# Patient Record
Sex: Male | Born: 1950 | Race: Black or African American | Hispanic: No | Marital: Married | Smoking: Never smoker
Health system: Southern US, Community
[De-identification: ages and names within clinical notes are randomized; demographics above are authoritative.]

## PROBLEM LIST (undated history)

## (undated) DIAGNOSIS — I10 Essential (primary) hypertension: Secondary | ICD-10-CM

## (undated) DIAGNOSIS — G4730 Sleep apnea, unspecified: Secondary | ICD-10-CM

## (undated) DIAGNOSIS — R569 Unspecified convulsions: Secondary | ICD-10-CM

## (undated) DIAGNOSIS — N529 Male erectile dysfunction, unspecified: Secondary | ICD-10-CM

## (undated) DIAGNOSIS — E785 Hyperlipidemia, unspecified: Secondary | ICD-10-CM

## (undated) HISTORY — PX: POLYPECTOMY: SHX149

## (undated) HISTORY — PX: SEPTOPLASTY: SUR1290

## (undated) HISTORY — DX: Essential (primary) hypertension: I10

## (undated) HISTORY — DX: Unspecified convulsions: R56.9

## (undated) HISTORY — PX: KNEE SURGERY: SHX244

## (undated) HISTORY — DX: Hyperlipidemia, unspecified: E78.5

## (undated) HISTORY — DX: Sleep apnea, unspecified: G47.30

## (undated) HISTORY — PX: COLONOSCOPY: SHX174

## (undated) HISTORY — DX: Male erectile dysfunction, unspecified: N52.9

---

## 1995-07-23 ENCOUNTER — Encounter: Payer: Self-pay | Admitting: Family Medicine

## 1995-07-23 LAB — CONVERTED CEMR LAB: PSA: 0.9 ng/mL

## 1998-06-13 ENCOUNTER — Emergency Department (HOSPITAL_COMMUNITY): Admission: EM | Admit: 1998-06-13 | Discharge: 1998-06-13 | Payer: Self-pay | Admitting: Emergency Medicine

## 2004-08-15 ENCOUNTER — Ambulatory Visit: Payer: Self-pay | Admitting: Family Medicine

## 2004-09-19 ENCOUNTER — Encounter: Payer: Self-pay | Admitting: Family Medicine

## 2004-09-19 LAB — CONVERTED CEMR LAB: PSA: 0.68 ng/mL

## 2004-09-21 ENCOUNTER — Ambulatory Visit: Payer: Self-pay | Admitting: Family Medicine

## 2004-09-26 ENCOUNTER — Ambulatory Visit: Payer: Self-pay | Admitting: Family Medicine

## 2004-12-25 ENCOUNTER — Ambulatory Visit: Payer: Self-pay | Admitting: Family Medicine

## 2005-07-22 ENCOUNTER — Encounter: Payer: Self-pay | Admitting: Family Medicine

## 2005-07-30 ENCOUNTER — Ambulatory Visit: Payer: Self-pay | Admitting: Family Medicine

## 2005-08-01 ENCOUNTER — Ambulatory Visit: Payer: Self-pay | Admitting: Family Medicine

## 2005-08-15 ENCOUNTER — Ambulatory Visit: Payer: Self-pay | Admitting: Family Medicine

## 2005-09-02 ENCOUNTER — Ambulatory Visit: Payer: Self-pay | Admitting: Family Medicine

## 2005-10-02 ENCOUNTER — Ambulatory Visit: Payer: Self-pay | Admitting: Family Medicine

## 2005-12-02 ENCOUNTER — Ambulatory Visit: Payer: Self-pay | Admitting: Family Medicine

## 2006-03-06 ENCOUNTER — Ambulatory Visit: Payer: Self-pay | Admitting: Family Medicine

## 2006-07-22 ENCOUNTER — Encounter: Payer: Self-pay | Admitting: Family Medicine

## 2006-07-22 LAB — CONVERTED CEMR LAB: PSA: 0.81 ng/mL

## 2006-08-05 ENCOUNTER — Ambulatory Visit: Payer: Self-pay | Admitting: Family Medicine

## 2006-08-05 LAB — CONVERTED CEMR LAB
ALT: 24 units/L (ref 0–40)
AST: 33 units/L (ref 0–37)
Albumin: 4 g/dL (ref 3.5–5.2)
Alkaline Phosphatase: 71 units/L (ref 39–117)
BUN: 11 mg/dL (ref 6–23)
CO2: 33 meq/L — ABNORMAL HIGH (ref 19–32)
Calcium: 9.6 mg/dL (ref 8.4–10.5)
Chloride: 100 meq/L (ref 96–112)
Cholesterol: 182 mg/dL (ref 0–200)
Creatinine, Ser: 1.1 mg/dL (ref 0.4–1.5)
GFR calc Af Amer: 89 mL/min
GFR calc non Af Amer: 74 mL/min
Glucose, Bld: 98 mg/dL (ref 70–99)
HDL: 58.8 mg/dL (ref >39.0)
LDL Cholesterol: 111 mg/dL — ABNORMAL HIGH (ref 0–99)
PSA: 0.81 ng/mL (ref 0.10–4.00)
Potassium: 4.4 meq/L (ref 3.5–5.1)
Sodium: 138 meq/L (ref 135–145)
TSH: 1.52 microintl units/mL (ref 0.35–5.50)
Total Bilirubin: 1 mg/dL (ref 0.3–1.2)
Total CHOL/HDL Ratio: 3.1
Total Protein: 7.4 g/dL (ref 6.0–8.3)
Triglycerides: 60 mg/dL (ref 0–149)
VLDL: 12 mg/dL (ref 0–40)

## 2006-08-26 ENCOUNTER — Ambulatory Visit: Payer: Self-pay | Admitting: Family Medicine

## 2007-01-29 ENCOUNTER — Encounter: Payer: Self-pay | Admitting: Family Medicine

## 2007-01-29 DIAGNOSIS — I10 Essential (primary) hypertension: Secondary | ICD-10-CM

## 2007-01-29 DIAGNOSIS — N4889 Other specified disorders of penis: Secondary | ICD-10-CM | POA: Insufficient documentation

## 2007-01-29 DIAGNOSIS — E785 Hyperlipidemia, unspecified: Secondary | ICD-10-CM | POA: Insufficient documentation

## 2007-09-24 ENCOUNTER — Ambulatory Visit: Payer: Self-pay | Admitting: Family Medicine

## 2007-10-20 ENCOUNTER — Ambulatory Visit: Payer: Self-pay | Admitting: Family Medicine

## 2007-10-20 LAB — CONVERTED CEMR LAB
OCCULT 1: POSITIVE
OCCULT 2: NEGATIVE
OCCULT 3: NEGATIVE

## 2007-11-03 ENCOUNTER — Ambulatory Visit: Payer: Self-pay | Admitting: Family Medicine

## 2007-11-03 LAB — CONVERTED CEMR LAB
ALT: 19 units/L (ref 0–53)
AST: 26 units/L (ref 0–37)
Albumin: 3.8 g/dL (ref 3.5–5.2)
Alkaline Phosphatase: 67 units/L (ref 39–117)
BUN: 15 mg/dL (ref 6–23)
Bilirubin, Direct: 0.1 mg/dL (ref 0.0–0.3)
CO2: 32 meq/L (ref 19–32)
Calcium: 9.3 mg/dL (ref 8.4–10.5)
Chloride: 105 meq/L (ref 96–112)
Cholesterol: 190 mg/dL (ref 0–200)
Creatinine, Ser: 1.1 mg/dL (ref 0.4–1.5)
GFR calc Af Amer: 88 mL/min
GFR calc non Af Amer: 73 mL/min
Glucose, Bld: 103 mg/dL — ABNORMAL HIGH (ref 70–99)
HDL: 51.8 mg/dL (ref >39.0)
LDL Cholesterol: 127 mg/dL — ABNORMAL HIGH (ref 0–99)
PSA: 0.59 ng/mL (ref 0.10–4.00)
Potassium: 4.5 meq/L (ref 3.5–5.1)
Sodium: 141 meq/L (ref 135–145)
TSH: 2.15 microintl units/mL (ref 0.35–5.50)
Total Bilirubin: 0.9 mg/dL (ref 0.3–1.2)
Total CHOL/HDL Ratio: 3.7
Total Protein: 7.1 g/dL (ref 6.0–8.3)
Triglycerides: 58 mg/dL (ref 0–149)
VLDL: 12 mg/dL (ref 0–40)

## 2007-11-10 ENCOUNTER — Ambulatory Visit: Payer: Self-pay | Admitting: Family Medicine

## 2008-02-11 ENCOUNTER — Ambulatory Visit: Payer: Self-pay | Admitting: Family Medicine

## 2008-02-11 DIAGNOSIS — F528 Other sexual dysfunction not due to a substance or known physiological condition: Secondary | ICD-10-CM | POA: Insufficient documentation

## 2008-02-29 ENCOUNTER — Telehealth: Payer: Self-pay | Admitting: Family Medicine

## 2008-03-10 ENCOUNTER — Ambulatory Visit: Payer: Self-pay | Admitting: Family Medicine

## 2008-03-10 LAB — CONVERTED CEMR LAB
OCCULT 1: NEGATIVE
OCCULT 2: NEGATIVE
OCCULT 3: NEGATIVE

## 2008-03-11 ENCOUNTER — Encounter (INDEPENDENT_AMBULATORY_CARE_PROVIDER_SITE_OTHER): Payer: Self-pay | Admitting: *Deleted

## 2008-05-16 ENCOUNTER — Ambulatory Visit: Payer: Self-pay | Admitting: Family Medicine

## 2008-12-15 ENCOUNTER — Encounter (INDEPENDENT_AMBULATORY_CARE_PROVIDER_SITE_OTHER): Payer: Self-pay | Admitting: *Deleted

## 2008-12-21 ENCOUNTER — Ambulatory Visit: Payer: Self-pay | Admitting: Family Medicine

## 2008-12-22 LAB — CONVERTED CEMR LAB
ALT: 25 units/L (ref 0–53)
AST: 30 units/L (ref 0–37)
Albumin: 3.7 g/dL (ref 3.5–5.2)
Alkaline Phosphatase: 63 units/L (ref 39–117)
BUN: 17 mg/dL (ref 6–23)
Basophils Absolute: 0 10*3/uL (ref 0.0–0.1)
Basophils Relative: 1.2 % (ref 0.0–3.0)
Bilirubin, Direct: 0 mg/dL (ref 0.0–0.3)
CO2: 29 meq/L (ref 19–32)
Calcium: 9 mg/dL (ref 8.4–10.5)
Chloride: 109 meq/L (ref 96–112)
Cholesterol: 193 mg/dL (ref 0–200)
Creatinine, Ser: 1.2 mg/dL (ref 0.4–1.5)
Eosinophils Absolute: 0.1 10*3/uL (ref 0.0–0.7)
Eosinophils Relative: 2.6 % (ref 0.0–5.0)
GFR calc non Af Amer: 79.61 mL/min (ref >60)
Glucose, Bld: 93 mg/dL (ref 70–99)
HCT: 38.8 % — ABNORMAL LOW (ref 39.0–52.0)
HDL: 54.5 mg/dL (ref >39.00)
Hemoglobin: 13.3 g/dL (ref 13.0–17.0)
LDL Cholesterol: 129 mg/dL — ABNORMAL HIGH (ref 0–99)
Lymphocytes Relative: 40.9 % (ref 12.0–46.0)
Lymphs Abs: 0.9 10*3/uL (ref 0.7–4.0)
MCHC: 34.4 g/dL (ref 30.0–36.0)
MCV: 88.8 fL (ref 78.0–100.0)
Monocytes Absolute: 0.4 10*3/uL (ref 0.1–1.0)
Monocytes Relative: 18.2 % — ABNORMAL HIGH (ref 3.0–12.0)
Neutro Abs: 0.9 10*3/uL — ABNORMAL LOW (ref 1.4–7.7)
Neutrophils Relative %: 37.1 % — ABNORMAL LOW (ref 43.0–77.0)
PSA: 0.96 ng/mL (ref 0.10–4.00)
Platelets: 181 10*3/uL (ref 150.0–400.0)
Potassium: 4.1 meq/L (ref 3.5–5.1)
RBC: 4.36 M/uL (ref 4.22–5.81)
RDW: 12.3 % (ref 11.5–14.6)
Sodium: 142 meq/L (ref 135–145)
TSH: 1.4 microintl units/mL (ref 0.35–5.50)
Total Bilirubin: 1.1 mg/dL (ref 0.3–1.2)
Total CHOL/HDL Ratio: 4
Total Protein: 7.1 g/dL (ref 6.0–8.3)
Triglycerides: 49 mg/dL (ref 0.0–149.0)
VLDL: 9.8 mg/dL (ref 0.0–40.0)
WBC: 2.3 10*3/uL — ABNORMAL LOW (ref 4.5–10.5)

## 2009-01-04 ENCOUNTER — Ambulatory Visit: Payer: Self-pay | Admitting: Family Medicine

## 2009-01-04 DIAGNOSIS — K921 Melena: Secondary | ICD-10-CM

## 2009-01-04 LAB — FECAL OCCULT BLOOD, GUAIAC: Fecal Occult Blood: POSITIVE

## 2009-01-04 LAB — CONVERTED CEMR LAB
OCCULT 1: POSITIVE
OCCULT 2: NEGATIVE
OCCULT 3: NEGATIVE

## 2009-02-02 ENCOUNTER — Ambulatory Visit: Payer: Self-pay | Admitting: Gastroenterology

## 2009-02-17 ENCOUNTER — Ambulatory Visit: Payer: Self-pay | Admitting: Gastroenterology

## 2009-02-17 ENCOUNTER — Encounter: Payer: Self-pay | Admitting: Gastroenterology

## 2009-02-17 LAB — HM COLONOSCOPY: HM Colonoscopy: 2

## 2009-02-26 ENCOUNTER — Encounter: Payer: Self-pay | Admitting: Gastroenterology

## 2009-03-23 ENCOUNTER — Ambulatory Visit: Payer: Self-pay | Admitting: Family Medicine

## 2009-12-25 ENCOUNTER — Ambulatory Visit: Payer: Self-pay | Admitting: Family Medicine

## 2009-12-25 LAB — CONVERTED CEMR LAB
AST: 27 units/L (ref 0–37)
Albumin: 4.1 g/dL (ref 3.5–5.2)
BUN: 16 mg/dL (ref 6–23)
Basophils Absolute: 0 10*3/uL (ref 0.0–0.1)
Basophils Relative: 0.9 % (ref 0.0–3.0)
Bilirubin, Direct: 0.1 mg/dL (ref 0.0–0.3)
CO2: 31 meq/L (ref 19–32)
Calcium: 9.3 mg/dL (ref 8.4–10.5)
Chloride: 105 meq/L (ref 96–112)
Cholesterol: 205 mg/dL — ABNORMAL HIGH (ref 0–200)
Creatinine, Ser: 1.2 mg/dL (ref 0.4–1.5)
Direct LDL: 123.8 mg/dL
Eosinophils Absolute: 0.1 10*3/uL (ref 0.0–0.7)
Glucose, Bld: 91 mg/dL (ref 70–99)
HCT: 39.3 % (ref 39.0–52.0)
Hemoglobin: 13.3 g/dL (ref 13.0–17.0)
Lymphocytes Relative: 37.7 % (ref 12.0–46.0)
Lymphs Abs: 1 10*3/uL (ref 0.7–4.0)
MCHC: 33.9 g/dL (ref 30.0–36.0)
MCV: 89.2 fL (ref 78.0–100.0)
Monocytes Absolute: 0.5 10*3/uL (ref 0.1–1.0)
Neutro Abs: 1.1 10*3/uL — ABNORMAL LOW (ref 1.4–7.7)
Neutrophils Relative %: 40.6 % — ABNORMAL LOW (ref 43.0–77.0)
PSA: 0.85 ng/mL (ref 0.10–4.00)
Platelets: 196 10*3/uL (ref 150.0–400.0)
Potassium: 4.4 meq/L (ref 3.5–5.1)
RBC: 4.41 M/uL (ref 4.22–5.81)
Sodium: 141 meq/L (ref 135–145)
TSH: 1.3 microintl units/mL (ref 0.35–5.50)
Total Bilirubin: 0.8 mg/dL (ref 0.3–1.2)
Total CHOL/HDL Ratio: 3
Total Protein: 7.1 g/dL (ref 6.0–8.3)
Triglycerides: 52 mg/dL (ref 0.0–149.0)
VLDL: 10.4 mg/dL (ref 0.0–40.0)
WBC: 2.7 10*3/uL — ABNORMAL LOW (ref 4.5–10.5)

## 2009-12-27 ENCOUNTER — Ambulatory Visit: Payer: Self-pay | Admitting: Family Medicine

## 2009-12-27 DIAGNOSIS — D72819 Decreased white blood cell count, unspecified: Secondary | ICD-10-CM | POA: Insufficient documentation

## 2010-02-27 ENCOUNTER — Encounter (INDEPENDENT_AMBULATORY_CARE_PROVIDER_SITE_OTHER): Payer: Self-pay | Admitting: *Deleted

## 2010-05-21 ENCOUNTER — Encounter: Admission: RE | Admit: 2010-05-21 | Discharge: 2010-05-21 | Payer: Self-pay | Admitting: Occupational Medicine

## 2010-08-21 NOTE — Letter (Signed)
Summary: Juan Wagner letter  [place] at [facility]  [address] [place], [place] [address]   Phone: [phone]  Fax: [phone]       02/27/2010 MRN: [id number]  Juan Wagner [address] [place], [place]  [address]  Dear Mr. HERMIZ,  [facility], and [facility] announce the retirement of Arta Silence, M.D., from full-time practice at the [facility] office effective January 18, 2010 and his plans of returning part-time.  It is important to Dr. Hetty Ely and to our practice that you understand that [facility] has seven physicians in our office for your health care needs.  We will continue to offer the same exceptional care that you have today.    Dr. Hetty Ely has spoken to many of you about his plans for retirement and returning part-time in the fall.   We will continue to work with you through the transition to schedule appointments for you in the office and meet the high standards that [place] is committed to.   Again, it is with great pleasure that we share the news that Dr. Hetty Ely will return to [facility] at [facility] in October of 2011 with a reduced schedule.    If you have any questions, or would like to request an appointment with one of our physicians, please call us at [phone] and press the option for Scheduling an appointment.  We take pleasure in providing you with excellent patient care and look forward to seeing you at your next office visit.  Our [facility] Physicians are:  Tillman Abide, M.D. Laurita Quint, M.D. Roxy Manns, M.D. Kerby Nora, M.D. Hannah Beat, M.D. Ruthe Mannan, M.D. We proudly welcomed Raechel Ache, M.D. and Eustaquio Boyden, M.D. to the practice in July/August 2011.  Sincerely,  [facility]

## 2010-08-21 NOTE — Assessment & Plan Note (Signed)
Summary: CPX/CLE   Vital Signs:  Patient profile:   60 year old male Weight:      191.50 pounds BMI:     27.58 Temp:     98.6 degrees F oral Pulse rate:   60 / minute Pulse rhythm:   regular BP sitting:   140 / 100  (left arm) Cuff size:   large  Vitals Entered By: Sydell Axon LPN (December 28, 2839 9:15 AM) CC: 30 Minute checkup, had a colonoscopy 07/10 by Dr. Russella Dar   History of Present Illness: Pt here for Comp Exam...he feels well and has no complaints. His pressure is slightly elevated today.  Problems Prior to Update: 1)  Hemoccult Positive Stool  (ICD-578.1) 2)  Erectile Dysfunction  (ICD-302.72) 3)  Special Screening Malignant Neoplasm of Prostate  (ICD-V76.44) 4)  Special Screening Malig Neoplasms Other Sites  (ICD-V76.49) 5)  Health Maintenance Exam  (ICD-V70.0) 6)  Peyronie's Disease  (ICD-607.89) 7)  Hypertension  (ICD-401.9) 8)  Hyperlipidemia  (ICD-272.4)  Medications Prior to Update: 1)  Multivitamins   Tabs (Multiple Vitamin) .... Once Daily By Mouth 2)  Bl Vitamin E 400 Unit  Caps (Vitamin E) .... Once Daily By Mouth 3)  Carvedilol 6.25 Mg  Tabs (Carvedilol) .... One Tab By Mouth Two Times A Day. 4)  Viagra 100 Mg Tabs (Sildenafil Citrate) .... One Tab By Mouth One Hour Prior To Desired Intercourse. 5)  Amlodipine Besylate 5 Mg Tabs (Amlodipine Besylate) .... One Tab By Mouth At Night  Allergies: 1)  Lisinopril (Lisinopril)  Past History:  Past Medical History: Last updated: 01/29/2007 Hyperlipidemia Hypertension  Past Surgical History: Last updated: 03/01/2009 Septoplasty  Broken Nose Colonoscopy 2 Polyps 02/17/09       8/15  Family History: Last updated: 12-28-09 Father: Died 50  CA, oral gum, smoker, ETOH Mother: Alive 59  HTN   Ovarian Ca, chemo 2010 remission Brother A 63 Cirrhosis (Smoke and drugs) Brother A 58 Sister dec 55  Pancr Ca Stage IV, mets to lungs HBP:  (+) self, MGM, MGF, Mother CA:  Father ETOH:  (+) Father Stroke:   (-)  Social History: Last updated: 12/21/2008 Never Smoked Alcohol use-no Drug use-no Marital Status: Married Children: 2 Occupation: IT sales professional [employer]  Risk Factors: Alcohol Use: 0 (12/21/2008) Caffeine Use: 0 (12/21/2008) Exercise: yes (12/21/2008)  Risk Factors: Smoking Status: never (12/21/2008) Passive Smoke Exposure: no (12/21/2008)  Family History: Father: Died 80  CA, oral gum, smoker, ETOH Mother: Alive 76  HTN   Ovarian Ca, chemo 2010 remission Brother A 63 Cirrhosis (Smoke and drugs) Brother A 58 Sister dec 55  Pancr Ca Stage IV, mets to lungs HBP:  (+) self, MGM, MGF, Mother CA:  Father ETOH:  (+) Father Stroke:  (-)  Review of Systems General:  Denies chills, fatigue, fever, sweats, weakness, and weight loss. Eyes:  Denies blurring, discharge, and eye pain; stye removal last week, left eye.. ENT:  Denies decreased hearing, earache, and ringing in ears. CV:  Denies chest pain or discomfort, fainting, fatigue, palpitations, shortness of breath with exertion, swelling of feet, and swelling of hands. Resp:  Denies cough, shortness of breath, and wheezing. GI:  Denies abdominal pain, bloody stools, change in bowel habits, constipation, dark tarry stools, diarrhea, indigestion, loss of appetite, nausea, vomiting, vomiting blood, and yellowish skin color. GU:  Denies dysuria, nocturia, and urinary frequency. MS:  Denies joint pain, low back pain, muscle aches, cramps, and stiffness. Derm:  Denies dryness, itching, and rash. Neuro:  Denies numbness, poor balance, tingling, and tremors.  Physical Exam  General:  Well-developed,well-nourished,in no acute distress; alert,appropriate and cooperative throughout examination Head:  Normocephalic and atraumatic without obvious abnormalities. No apparent alopecia or balding. Eyes:  Conjunctiva clear bilaterally.  Ears:  External ear exam shows no significant lesions or deformities.  Otoscopic examination reveals clear  canals, tympanic membranes are intact bilaterally without bulging, retraction, inflammation or discharge. Hearing is grossly normal bilaterally. Nose:  External nasal examination shows no deformity or inflammation. Nasal mucosa are pink and moist without lesions or exudates. Mouth:  Oral mucosa and oropharynx without lesions or exudates.  Teeth in good repair. Neck:  No deformities, masses, or tenderness noted. Chest Wall:  No deformities, masses, tenderness or gynecomastia noted. Breasts:  No masses or gynecomastia noted Lungs:  Normal respiratory effort, chest expands symmetrically. Lungs are clear to auscultation, no crackles or wheezes. Heart:  Normal rate and regular rhythm. S1 and S2 normal without gallop, murmur, click, rub or other extra sounds. Abdomen:  Bowel sounds positive,abdomen soft and non-tender without masses, organomegaly or hernias noted. Rectal:  No external abnormalities noted. Normal sphincter tone. No rectal masses or tenderness. G neg. Genitalia:  Testes bilaterally descended without nodularity, tenderness or masses. No scrotal masses or lesions. No penis lesions or urethral discharge. Prostate:  Prostate gland firm and smooth, no enlargement, nodularity, tenderness, mass, asymmetry or induration. 10-20gms. Msk:  No deformity or scoliosis noted of thoracic or lumbar spine.   Pulses:  R and L carotid,radial,femoral,dorsalis pedis and posterior tibial pulses are full and equal bilaterally Extremities:  No clubbing, cyanosis, edema, or deformity noted with normal full range of motion of all joints.   Neurologic:  No cranial nerve deficits noted. Station and gait are normal. Plantar reflexes are down-going bilaterally. DTRs are symmetrical throughout. Sensory, motor and coordinative functions appear intact. Skin:  Intact without suspicious lesions or rashes Cervical Nodes:  No lymphadenopathy noted Inguinal Nodes:  No significant adenopathy Psych:  Cognition and judgment  appear intact. Alert and cooperative with normal attention span and concentration. No apparent delusions, illusions, hallucinations   Impression & Recommendations:  Problem # 1:  HEALTH MAINTENANCE EXAM (ICD-V70.0) Assessment Comment Only  Problem # 2:  SPECIAL SCREENING MALIGNANT NEOPLASM OF PROSTATE (ICD-V76.44) Assessment: Unchanged Stable PSA and exam.  Problem # 3:  HYPERTENSION (ICD-401.9) Assessment: Deteriorated Will try to check by having him get BPs at work weekly and review. If typically high, increase Amlodipine to 10mg  daily. If not, cont curr dose. See me in OCt or Drs Para March or gutierrez in 3 mos. The following medications were removed from the medication list:    Carvedilol 6.25 Mg Tabs (Carvedilol) ..... One tab by mouth two times a day. His updated medication list for this problem includes:    Amlodipine Besylate 5 Mg Tabs (Amlodipine besylate) ..... One tab by mouth at night  BP today: 140/100 Prior BP: 120/80 (03/23/2009)  Labs Reviewed: K+: 4.4 (12/25/2009) Creat: : 1.2 (12/25/2009)   Chol: 205 (12/25/2009)   HDL: 63.90 (12/25/2009)   LDL: 129 (12/21/2008)   TG: 52.0 (12/25/2009)  Problem # 4:  HYPERLIPIDEMIA (ICD-272.4) Assessment: Unchanged Stable, LDL slightly elevated but adequate for him. Avoid fatty foods. 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 May need referral  if continues. Recheck in the future.  Complete Medication List: 1)  Multivitamins Tabs (Multiple vitamin) .... Once daily by mouth 2)  Viagra 100 Mg Tabs (Sildenafil citrate) .... One tab by mouth one hour prior to desired intercourse. 3)  Amlodipine Besylate 5 Mg Tabs (Amlodipine besylate) .... One tab by  mouth at night  Patient Instructions: 1)  Call in three weeks for appt w/me in Oct or if not avail, with new doc in three mos. Prescriptions: AMLODIPINE BESYLATE 5 MG TABS (AMLODIPINE BESYLATE) one tab by mouth at night  #30 x 12   Entered and Authorized by:   Shaune Leeks MD   Signed by:   Shaune Leeks MD on 12/27/2009   Method used:   Electronically to        [facility]  [address] (retail)       [address], [address]       [place], [place]  [address]       Ph: [phone]       Fax: [phone]   RxID:   [id number]   Current Allergies (reviewed today): LISINOPRIL (LISINOPRIL)

## 2011-02-20 DIAGNOSIS — G40219 Localization-related (focal) (partial) symptomatic epilepsy and epileptic syndromes with complex partial seizures, intractable, without status epilepticus: Secondary | ICD-10-CM | POA: Insufficient documentation

## 2011-03-30 ENCOUNTER — Emergency Department (HOSPITAL_COMMUNITY)
Admission: EM | Admit: 2011-03-30 | Discharge: 2011-03-30 | Disposition: A | Discharge status: Home / Self Care | Payer: 59 | Attending: Emergency Medicine | Admitting: Emergency Medicine

## 2011-03-30 ENCOUNTER — Emergency Department (HOSPITAL_COMMUNITY): Payer: 59

## 2011-03-30 DIAGNOSIS — S01502A Unspecified open wound of oral cavity, initial encounter: Secondary | ICD-10-CM | POA: Insufficient documentation

## 2011-03-30 DIAGNOSIS — I10 Essential (primary) hypertension: Secondary | ICD-10-CM | POA: Insufficient documentation

## 2011-03-30 DIAGNOSIS — R569 Unspecified convulsions: Secondary | ICD-10-CM | POA: Insufficient documentation

## 2011-03-30 DIAGNOSIS — Z79899 Other long term (current) drug therapy: Secondary | ICD-10-CM | POA: Insufficient documentation

## 2011-03-30 DIAGNOSIS — R279 Unspecified lack of coordination: Secondary | ICD-10-CM | POA: Insufficient documentation

## 2011-03-30 DIAGNOSIS — W503XXA Accidental bite by another person, initial encounter: Secondary | ICD-10-CM | POA: Insufficient documentation

## 2011-03-30 LAB — CBC
HCT: 38.3 % — ABNORMAL LOW (ref 39.0–52.0)
MCH: 29.3 pg (ref 26.0–34.0)
MCHC: 34.2 g/dL (ref 30.0–36.0)
MCV: 85.7 fL (ref 78.0–100.0)
Platelets: 184 10*3/uL (ref 150–400)
RDW: 11.9 % (ref 11.5–15.5)
WBC: 3.8 10*3/uL — ABNORMAL LOW (ref 4.0–10.5)

## 2011-03-30 LAB — DIFFERENTIAL
Basophils Absolute: 0 10*3/uL (ref 0.0–0.1)
Basophils Relative: 1 % (ref 0–1)
Eosinophils Absolute: 0.1 10*3/uL (ref 0.0–0.7)
Eosinophils Relative: 2 % (ref 0–5)
Lymphocytes Relative: 28 % (ref 12–46)
Lymphs Abs: 1.1 10*3/uL (ref 0.7–4.0)
Monocytes Absolute: 0.5 10*3/uL (ref 0.1–1.0)
Monocytes Relative: 13 % — ABNORMAL HIGH (ref 3–12)
Neutrophils Relative %: 57 % (ref 43–77)

## 2011-03-30 LAB — BASIC METABOLIC PANEL
BUN: 16 mg/dL (ref 6–23)
CO2: 27 mEq/L (ref 19–32)
Calcium: 9.2 mg/dL (ref 8.4–10.5)
Chloride: 101 mEq/L (ref 96–112)
Creatinine, Ser: 1.1 mg/dL (ref 0.50–1.35)
GFR calc Af Amer: >60 mL/min (ref >60)
GFR calc non Af Amer: >60 mL/min (ref >60)
Glucose, Bld: 115 mg/dL — ABNORMAL HIGH (ref 70–99)
Potassium: 3.8 mEq/L (ref 3.5–5.1)
Sodium: 137 mEq/L (ref 135–145)

## 2011-04-01 ENCOUNTER — Encounter: Payer: Self-pay | Admitting: Family Medicine

## 2011-04-01 ENCOUNTER — Ambulatory Visit (INDEPENDENT_AMBULATORY_CARE_PROVIDER_SITE_OTHER): Payer: 59 | Admitting: Family Medicine

## 2011-04-01 VITALS — BP 136/80 | HR 60 | Temp 98.5°F | Wt 184.0 lb

## 2011-04-01 DIAGNOSIS — R569 Unspecified convulsions: Secondary | ICD-10-CM

## 2011-04-01 NOTE — Progress Notes (Signed)
Pt woke his wife at night.  He doesn't remember it.  He was kicking and jerking, bit his tongue.  He wasn't responsive to voice.  He stopped moving and then had postictal period. Witnessed by wife. Movement portion lasted a few minutes.  The ictal state lasted longer.  By the time EMS was there, he was able to mumble. He remembers going to bed before and the some of the hospital after the event, but none of the interval history.    No h/o seizure prev.  Wife has noted some difficulty with short term recall after the ER.  He was drowsy after treatment at the ER, unclear how much of this is related to the memory changes.  No known h/o TBI, concussions.  Wife has noted snoring, she may have noted apneas that were brief.  She's noted other movements in the bed, "he'll kick me."    He's been under a lot of stress, concerned about people at work.  He needs neuro referral arranged.   PMH, FH, and SH reviewed, no FH SZ d/o.   ROS: See HPI, otherwise noncontributory.  Meds, vitals, and allergies reviewed.   GEN: nad, alert HEENT: mucous membranes moist NECK: supple w/o LA CV: rrr.  no murmur PULM: ctab, no inc wob ABD: soft, +bs EXT: no edema SKIN: no acute rash CN 2-12 wnl B, S/S/DTR wnl x4 (except global and symmetric dec in DTRs), no tremor, no pronator drift, normal finger to nose, cerebellar testing wnl Orient to place, person, year, month and day of week.  He thought today was Sept 11th, not the 10th. 0/3 of recall 2/5 of DLROW (missed order of last 3) Speech wnl Follows commands

## 2011-04-01 NOTE — Assessment & Plan Note (Signed)
ER recs reviewed.  New dx, potentially complicated by a sleep disorder.  I talked with pt and wife.  I would like neuro input for SZ d/o and also for need for sleep study.  I don't know how much contribution potential OSA could play into this.  I didn't start new meds today.  If another SZ, then proceed to ER.  He and wife agree.  D/w pt about not getting into high risk situation (driving, etc).  He agrees.  >25 min spent with face to face with patient, >50% counseling.

## 2011-04-01 NOTE — Patient Instructions (Signed)
See Shirlee Limerick about your referral before your leave today. Don't drive, bike, or exercise on a treadmill.   I want you to talk with neurology about the recent events.  If you have another seizure, then go the ER.  Take care.

## 2011-04-02 ENCOUNTER — Encounter: Payer: Self-pay | Admitting: Neurology

## 2011-04-02 ENCOUNTER — Ambulatory Visit (INDEPENDENT_AMBULATORY_CARE_PROVIDER_SITE_OTHER): Payer: 59 | Admitting: Neurology

## 2011-04-02 VITALS — BP 144/104 | HR 64 | Ht 70.0 in | Wt 183.0 lb

## 2011-04-02 DIAGNOSIS — R569 Unspecified convulsions: Secondary | ICD-10-CM

## 2011-04-02 NOTE — Patient Instructions (Signed)
You have been scheduled for a MRI at [facility] on Saturday, Sept. 15th at 3:00pm.  Please arrive by 2:45pm.  We will call you with your appointment for the EEG.  It will be done at [facility].

## 2011-04-02 NOTE — Progress Notes (Signed)
Dear Dr. Para March,  Thank you for having me see Juan Wagner in consultation today for his new onset seizure.  As you may recall Juan Wagner is a 60 year old man who has a history of asymptomatic leukopenia who had a nocturnal convulsion on March 30, 2011.  His wife was awoken by him convulsing with an accompany head version, but no unilateral jerking. Juan Wagner bit his tongue. It is unclear the direction of the head version.  Juan Wagner does not remember the event, and does not even remember going to the ED.  Juan Wagner was given Ativan there, had an unremarkable head CT and then sent home.  Basic lab work was unremarkable.    Juan Wagner did have an event earlier in the night where Juan Wagner sat up in bed, and seemed unresponsive.  Juan Wagner did say Juan Wagner was "jittery", but then fell back to sleep.  Juan Wagner denies any obvious precipitating events.  Juan Wagner still was slightly confused the next day, but now has returned to normal.    PMHx:  HTN, HLD.  No history of serious head trauma, meningitis or encephalitis, dev delay or birth trauma.  Juan Wagner has never had febrile seizures.  SocHx:  No tob, no EtOH.  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  FamHx:  No epilepsy.  ROS:  13 systems were reviewed and are significant for leukopenia.  However, Juan Wagner has been asymptomatic.  Other ROS negative.  Exam: Filed Vitals:   04/02/11 1521  BP: 144/104  Pulse: 64  Height: 5\' 10"  (1.778 m)  Weight: 183 lb (83.008 kg)   Gen:  Very well appearing man who looks younger than stated age.  Cardiovascular: The patient has a regular rate and rhythm and no carotid bruits.  Fundoscopy:  Disks are flat. Vessel caliber within normal limits.  Mental status:   The patient is oriented to person, place and time. Recent and remote memory are intact. Attention span and concentration are normal. Language including repetition, naming, following commands are intact. Fund of knowledge of current and historical events, as well as vocabulary are normal.  Cranial Nerves: Pupils are equally round and  reactive to light. Visual fields full to confrontation. Extraocular movements are intact without nystagmus. Facial sensation and muscles of mastication are intact. Muscles of facial expression are symmetric. Hearing intact to bilateral finger rub. Tongue protrusion, uvula, palate midline.  Shoulder shrug intact  Motor:  The patient has normal bulk and tone, no pronator drift and 5/5 strength bilaterally.  There are no adventitious movements.  Reflexes:  Are 2+ RUE,  1+ LUE, 1+ RLE, absent LLE, toes down, no hoffman's  Coordination:  Normal finger to nose.  No dysdiadokinesia.  Sensation is intact to temperature and vibration.  Gait and Station are normal.  Tandem gait is intact.  Romberg is negative  Impression/Recs:  New onset convulsion.  Exam unremarkable except for mild asymmetry of reflexes.  I am going to get an MRI of his brain and an EEG.  Based on those results we will have to decide about further treatment.  I have told him Juan Wagner cannot drive for 6 months.  We discussed at length the need for a lumbar puncture, but despite his leukopenia I am not convinced Juan Wagner has a infective process given his excellent state of health after the event.  If however, Juan Wagner develops fevers or chills or other complaints Juan Wagner will need an LP urgently.  The differential for seizure in this age category is either due to a small ischemic stroke or  mass lesion so the MRI imaging is going to be very important.  I will see the patient back after his imaging is complete.  Thank you for having [place] see this patient in consultation.  Feel free to contact me with any questions.  [name] [name], MD [facility], [place] [address] [place], [place] [address] Phone: [phone] Fax: [phone].

## 2011-04-04 ENCOUNTER — Ambulatory Visit (HOSPITAL_COMMUNITY)
Admission: RE | Admit: 2011-04-04 | Discharge: 2011-04-04 | Disposition: A | Discharge status: Home / Self Care | Payer: 59 | Source: Ambulatory Visit | Attending: Neurology | Admitting: Neurology

## 2011-04-04 DIAGNOSIS — G40909 Epilepsy, unspecified, not intractable, without status epilepticus: Secondary | ICD-10-CM

## 2011-04-04 DIAGNOSIS — R569 Unspecified convulsions: Secondary | ICD-10-CM | POA: Insufficient documentation

## 2011-04-04 DIAGNOSIS — Z1389 Encounter for screening for other disorder: Secondary | ICD-10-CM | POA: Insufficient documentation

## 2011-04-05 NOTE — Procedures (Signed)
EEG NUMBER:  [id number]  This routine EEG was requested in this 60 year old man with a history of one seizure.  The purpose of this EEG is to look for interictal epileptiform discharges.  He is on no anti-convulsant medication  The EEG was done with the patient awake and drowsy.  During periods of maximal wakefulness, he had a 9-10 cycle per second posterior dominant rhythm that attenuated with eye opening and was symmetric.  Background activities were composed of both alpha and beta activities that were symmetric.  Photic stimulation produced a symmetric driving response.  Hyperventilation did not markedly change the tracing.  The patient did become drowsy as evidenced by an attenuation of the alpha rhythm and muscle activity.  There is slowing of background activities with bursts of theta activity.  Stage 2 sleep was not reached.  CLINICAL INTERPRETATION:  This routine EEG done with the patient awake is normal.          ______________________________ Denton Meek, MD    IO:NGEX D:  04/04/2011 22:41:52  T:  04/05/2011 04:08:04  Job #:  [id number]

## 2011-04-06 ENCOUNTER — Ambulatory Visit (HOSPITAL_COMMUNITY)
Admission: RE | Admit: 2011-04-06 | Discharge: 2011-04-06 | Disposition: A | Discharge status: Home / Self Care | Payer: 59 | Source: Ambulatory Visit | Attending: Neurology | Admitting: Neurology

## 2011-04-06 DIAGNOSIS — R569 Unspecified convulsions: Secondary | ICD-10-CM | POA: Insufficient documentation

## 2011-04-06 DIAGNOSIS — G930 Cerebral cysts: Secondary | ICD-10-CM | POA: Insufficient documentation

## 2011-04-06 MED ORDER — GADOBENATE DIMEGLUMINE 529 MG/ML IV SOLN
17.0000 mL | Freq: Once | INTRAVENOUS | Status: AC | PRN
  Administered 2011-04-06: 17 mL via INTRAVENOUS

## 2011-04-09 ENCOUNTER — Ambulatory Visit (INDEPENDENT_AMBULATORY_CARE_PROVIDER_SITE_OTHER): Payer: 59 | Admitting: Neurology

## 2011-04-09 ENCOUNTER — Encounter: Payer: Self-pay | Admitting: Neurology

## 2011-04-09 VITALS — BP 128/86 | HR 64 | Wt 185.0 lb

## 2011-04-09 DIAGNOSIS — R569 Unspecified convulsions: Secondary | ICD-10-CM

## 2011-04-09 MED ORDER — OXCARBAZEPINE 150 MG PO TABS: 450.0000 mg | ORAL_TABLET | Freq: Two times a day (BID) | ORAL | Status: DC

## 2011-04-09 NOTE — Progress Notes (Signed)
Dear Dr. Para March,  I saw [name] back to review the results of his MRI brain and routine EEG after his first presentation of seizure.  Since he was last seen he has not had any further events.  His routine EEG was normal.  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  I reviewed the images and I think it is a benign lesion.  However, given the new onset seizures we need to follow it closely.  ROS:  13 systems were reviewed and were otherwise negative.  Exam: Filed Vitals:   04/09/11 1121  BP: 128/86  Pulse: 64  Weight: 185 lb (83.915 kg)   Gen:  Very well appearing man.  Cardiovascular: The patient has a regular rate and rhythm and no carotid bruits.  Fundoscopy:  Disks are flat. Vessel caliber within normal limits.  Mental status:   The patient is oriented to person, place and time. Recent and remote memory are intact. Attention span and concentration are normal. Language including repetition, naming, following commands are intact. Fund of knowledge of current and historical events, as well as vocabulary are normal.  Cranial Nerves: Pupils are equally round and reactive to light. Visual fields full to confrontation. Extraocular movements are intact without nystagmus. Facial sensation and muscles of mastication are intact. Muscles of facial expression are symmetric. Hearing intact to bilateral finger rub. Tongue protrusion, uvula, palate midline.  Shoulder shrug intact  Motor:  The patient has normal bulk and tone, no pronator drift and 5/5 strength bilaterally.  There are no adventitious movements.  Reflexes: Are 2+ RUE, 1+ LUE, 1+ RLE, absent LLE, toes down, no hoffman's   Coordination:  Normal finger to nose.  No dysdiadokinesia.  Sensation is intact to temperature and vibration.  Gait and Station are normal.  Tandem gait is intact.  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  Plan 1.  Parahippocampal lesion - I will get another MRI brain in 6 weeks to follow the lesion.  If it is stable we will get another in 3 months and go q3 months for at least 1 year.  If there are any changes, I will refer the patient to neuro-oncology at [facility]. 2.  Seizure - Given his lesion and its location I recommended starting Trileptal and increase to 450mg  bid.  He cannot drive for 6 months from September 8th.  We will see the patient back in 3 months.  [name] [name], MD [facility], [place]

## 2011-04-09 NOTE — Patient Instructions (Addendum)
Titration to Trileptal(oxcarbazepine) 450mg  twice a day using  150mg  tablets.   Morning Dose Evening Dose  Week 1 0 tablets  1 tablet (150mg )  Week 2 1 tablet (150mg ) 1 tablet (150mg )  Week 3 1 tablet (150mg ) 2 tablets (300mg )  Week 4 2 tablets (300mg ) 2 tablets (300mg )  Week 5 2 tablets (300mg ) 3 tablets (450mg )  Week 6 3 tablets (450mg ) 3 tablets (450mg )  After Week 6 continue at 3 tablets twice per day. Titration requires 147 tablets 150mg  tablets.  Your next MRI is scheduled for Tuesday, October 30th at 8:00am at [facility].  Go by your primary care office a few days before the MRI to have your labs drawn.

## 2011-04-10 ENCOUNTER — Telehealth: Payer: Self-pay | Admitting: *Deleted

## 2011-04-10 DIAGNOSIS — I10 Essential (primary) hypertension: Secondary | ICD-10-CM

## 2011-04-10 MED ORDER — AMLODIPINE BESYLATE 5 MG PO TABS: 5.0000 mg | ORAL_TABLET | Freq: Every day | ORAL | Status: DC

## 2011-04-10 NOTE — Telephone Encounter (Signed)
Pt states he is to have an MRI on 10/30 and states he needs labs prior to the procedure.  Please advise.

## 2011-04-12 ENCOUNTER — Encounter: Payer: Self-pay | Admitting: Family Medicine

## 2011-04-13 NOTE — Telephone Encounter (Signed)
Please verify with rady that all he needs is a Cr 1 week before the MRI.  I have ordered the Cr in the meantime.  Thanks.

## 2011-04-15 NOTE — Telephone Encounter (Addendum)
After researching the MRI ordered it was determined that Dr. Denton Meek ordered this.  Spoke to [employer] which is the nurse at Dr. Nash Dimmer office and was advised that patient needs to have a Bmet done prior to MRI per Dr. [name]. Tiffany has put this order in the system. Tiffany stated that the patient told Dr. [name] that it would be easier for him to come here to get the blood work done.  Spoke to patient's wife and was informed that patient will be calling back to get the lab appointment scheduled since the order is already in the system.

## 2011-04-15 NOTE — Telephone Encounter (Signed)
Noted. I cancelled the Cr that I ordered but left the BMET per Dr. [name].

## 2011-05-16 ENCOUNTER — Other Ambulatory Visit (INDEPENDENT_AMBULATORY_CARE_PROVIDER_SITE_OTHER): Payer: 59

## 2011-05-16 DIAGNOSIS — R569 Unspecified convulsions: Secondary | ICD-10-CM

## 2011-05-16 LAB — BASIC METABOLIC PANEL
BUN: 17 mg/dL (ref 6–23)
CO2: 29 mEq/L (ref 19–32)
Chloride: 102 mEq/L (ref 96–112)
Creatinine, Ser: 1.1 mg/dL (ref 0.4–1.5)
Glucose, Bld: 89 mg/dL (ref 70–99)
Potassium: 4.3 mEq/L (ref 3.5–5.1)
Sodium: 138 mEq/L (ref 135–145)

## 2011-05-16 NOTE — Progress Notes (Signed)
Addended by: Baldomero Lamy on: 05/16/2011 09:11 AM   Modules accepted: Orders

## 2011-05-21 ENCOUNTER — Ambulatory Visit (HOSPITAL_COMMUNITY)
Admission: RE | Admit: 2011-05-21 | Discharge: 2011-05-21 | Disposition: A | Discharge status: Home / Self Care | Payer: 59 | Source: Ambulatory Visit | Attending: Neurology | Admitting: Neurology

## 2011-05-21 DIAGNOSIS — H0520 Unspecified exophthalmos: Secondary | ICD-10-CM | POA: Insufficient documentation

## 2011-05-21 DIAGNOSIS — R569 Unspecified convulsions: Secondary | ICD-10-CM | POA: Insufficient documentation

## 2011-05-21 DIAGNOSIS — Z09 Encounter for follow-up examination after completed treatment for conditions other than malignant neoplasm: Secondary | ICD-10-CM | POA: Insufficient documentation

## 2011-05-21 MED ORDER — GADOBENATE DIMEGLUMINE 529 MG/ML IV SOLN
17.0000 mL | Freq: Once | INTRAVENOUS | Status: AC
  Administered 2011-05-21: 17 mL via INTRAVENOUS

## 2011-05-23 ENCOUNTER — Telehealth: Payer: Self-pay | Admitting: Neurology

## 2011-05-24 NOTE — Telephone Encounter (Signed)
MRI looks unchanged, no change in area we saw last time, which is great news.  We will do another scan in 6 months, but I will see him when he follows up in a few weeks.

## 2011-05-24 NOTE — Telephone Encounter (Signed)
Pt aware of MRI results and is due to come back week of Dec 17th.  We will call with appt once the schedule is made.

## 2011-06-28 ENCOUNTER — Encounter: Payer: Self-pay | Admitting: Neurology

## 2011-06-28 ENCOUNTER — Ambulatory Visit (INDEPENDENT_AMBULATORY_CARE_PROVIDER_SITE_OTHER): Payer: 59 | Admitting: Neurology

## 2011-06-28 VITALS — BP 128/90 | HR 64 | Wt 194.0 lb

## 2011-06-28 DIAGNOSIS — G40209 Localization-related (focal) (partial) symptomatic epilepsy and epileptic syndromes with complex partial seizures, not intractable, without status epilepticus: Secondary | ICD-10-CM

## 2011-06-28 NOTE — Progress Notes (Signed)
Dear Dr. Hetty Ely,  I saw  Juan Wagner back in [facility] for his problem with seizure.  As you may recall, he is a 60 y.o. year old male with a history of new onset seizure in September 2012.  An MRI brain revealed a cystic lesion in his parahippocampal gyrus, and a repeat MRI brain 6 weeks later was unchanged.  I started him on Trileptal 450mg  bid which he tolerates well.  He has had no further events.  Medical history, social history, and family history were reviewed and have not changed since the last clinic visit.  Current Outpatient Prescriptions on File Prior to Visit  Medication Sig Dispense Refill  . amLODipine (NORVASC) 5 MG tablet Take 1 tablet (5 mg total) by mouth daily.  30 tablet  6  . aspirin 81 MG tablet Take 81 mg by mouth daily.        . Multiple Vitamin (MULTIVITAMIN) tablet Take 1 tablet by mouth daily.        . OXcarbazepine (TRILEPTAL) 150 MG tablet Take 3 tablets (450 mg total) by mouth 2 (two) times daily.  180 tablet  3  . sildenafil (VIAGRA) 100 MG tablet One tablet by mouth one hour prior to desired intercourse.         Allergies  Allergen Reactions  . Lisinopril     REACTION: unspecified    ROS:  13 systems were reviewed and are notable for snoring at night, used to stop breathing but does not now, does wake up refreshed.  No headaches.  All other review of systems are unremarkable.  Exam: . Filed Vitals:   06/28/11 0929  BP: 128/90  Pulse: 64  Weight: 194 lb (87.998 kg)    In general, well appearing older man, looks younger than stated age.  Mental status:   The patient is oriented to person, place and time. Recent and remote memory are intact. Attention span and concentration are normal. Language including repetition, naming, following commands are intact. Fund of knowledge of current and historical events, as well as vocabulary are normal.  Cranial Nerves: Pupils are equally round and reactive to light. Visual fields full to  confrontation. Extraocular movements are intact without nystagmus. Facial sensation and muscles of mastication are intact. Muscles of facial expression are symmetric. Hearing intact to bilateral finger rub. Tongue protrusion, uvula, palate midline.  Shoulder shrug intact  Motor:  Normal bulk and tone, no drift and 5/5 muscle strength bilaterally.  Reflexes:  1+ thoughout, toes down.  Coordination:  Normal finger to nose  Gait:  Normal gait and station.    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  Recommendations:  We will continue the Trileptal 450mg  bid.  I will see him back in six months and at that time also repeat an MRI brain with and without contrast.  They will call to set this up.  He can start driving again in February.  We will see the patient back in 6 months.  [name] [name], MD [facility], [place]

## 2011-08-13 ENCOUNTER — Other Ambulatory Visit: Payer: Self-pay | Admitting: Neurology

## 2012-01-01 ENCOUNTER — Other Ambulatory Visit (INDEPENDENT_AMBULATORY_CARE_PROVIDER_SITE_OTHER): Payer: 59

## 2012-01-01 ENCOUNTER — Other Ambulatory Visit: Payer: Self-pay | Admitting: Family Medicine

## 2012-01-01 DIAGNOSIS — D72819 Decreased white blood cell count, unspecified: Secondary | ICD-10-CM

## 2012-01-01 DIAGNOSIS — Z125 Encounter for screening for malignant neoplasm of prostate: Secondary | ICD-10-CM

## 2012-01-01 DIAGNOSIS — I10 Essential (primary) hypertension: Secondary | ICD-10-CM

## 2012-01-01 LAB — LIPID PANEL
Cholesterol: 173 mg/dL (ref 0–200)
HDL: 57.8 mg/dL (ref >39.00)
LDL Cholesterol: 101 mg/dL — ABNORMAL HIGH (ref 0–99)
Triglycerides: 72 mg/dL (ref 0.0–149.0)

## 2012-01-01 LAB — COMPREHENSIVE METABOLIC PANEL
Albumin: 4.1 g/dL (ref 3.5–5.2)
Alkaline Phosphatase: 84 U/L (ref 39–117)
BUN: 15 mg/dL (ref 6–23)
Creatinine, Ser: 1.1 mg/dL (ref 0.4–1.5)
Glucose, Bld: 88 mg/dL (ref 70–99)
Potassium: 4.8 mEq/L (ref 3.5–5.1)

## 2012-01-01 LAB — CBC WITH DIFFERENTIAL/PLATELET
Basophils Absolute: 0 10*3/uL (ref 0.0–0.1)
Eosinophils Absolute: 0 10*3/uL (ref 0.0–0.7)
HCT: 39.2 % (ref 39.0–52.0)
Hemoglobin: 12.9 g/dL — ABNORMAL LOW (ref 13.0–17.0)
Lymphs Abs: 1 10*3/uL (ref 0.7–4.0)
MCHC: 33 g/dL (ref 30.0–36.0)
Neutro Abs: 1.5 10*3/uL (ref 1.4–7.7)
Platelets: 192 10*3/uL (ref 150.0–400.0)
RDW: 12.9 % (ref 11.5–14.6)

## 2012-01-02 ENCOUNTER — Ambulatory Visit (INDEPENDENT_AMBULATORY_CARE_PROVIDER_SITE_OTHER): Payer: 59 | Admitting: Neurology

## 2012-01-02 ENCOUNTER — Encounter: Payer: Self-pay | Admitting: Neurology

## 2012-01-02 VITALS — BP 132/90 | HR 68 | Wt 189.0 lb

## 2012-01-02 DIAGNOSIS — R569 Unspecified convulsions: Secondary | ICD-10-CM

## 2012-01-02 NOTE — Progress Notes (Signed)
Dear Dr. Para March,   I saw Juan Wagner back to for follow up of a lone seizure in the setting of a normal routine EEG but a  MRI brain revealed a non-enhancing cystic lesion in the right parahippocampal gyrus, a highly epileptogenic area. I reviewed the images and I think it is a benign lesion. His last MRI brain was done in September 2012.  He has had no further seizures.  Doing well.  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  He is back to driving.  Medical history, social history, and family history were reviewed and have not changed since the last clinic visit.  Current Outpatient Prescriptions on File Prior to Visit  Medication Sig Dispense Refill  . amLODipine (NORVASC) 5 MG tablet Take 1 tablet (5 mg total) by mouth daily.  30 tablet  6  . aspirin 81 MG tablet Take 81 mg by mouth daily.        . Multiple Vitamin (MULTIVITAMIN) tablet Take 1 tablet by mouth daily.        . OXcarbazepine (TRILEPTAL) 150 MG tablet TAKE THREE TABLETS BY MOUTH TWICE DAILY  180 tablet  11    Allergies  Allergen Reactions  . Lisinopril     Pt unaware of any problem with this med    ROS:  13 systems were reviewed and  are unremarkable.  Exam: . Filed Vitals:   01/02/12 0944  BP: 132/90  Pulse: 68  Weight: 189 lb (85.73 kg)    In general, well appearing man.  Mental status:   The patient is oriented to person, place and time. Recent and remote memory are intact. Attention span and concentration are normal. Language including repetition, naming, following commands are intact. Fund of knowledge of current and historical events, as well as vocabulary are normal.  Cranial Nerves: Pupils are equally round and reactive to light. Visual fields full to confrontation. Extraocular movements are intact without nystagmus. Facial sensation and muscles of mastication are intact. Muscles of facial expression are symmetric.  Tongue protrusion, uvula, palate midline.   Shoulder shrug intact  Motor:  Normal bulk and tone, no drift and 5/5 muscle strength bilaterally.  Reflexes:  1+ thoughout.  Coordination:  Normal finger to nose  Gait:  Normal gait and station.  Romberg negative.  Impression/Recommendations:  1.  Seizure in the setting of a right parahippocampal lesion - I am going to repeat an MRI brain for surveillance for his possible tumor.  He will stay on the Trileptal at 450 bid.   [name] [name], MD [facility], [place]

## 2012-01-02 NOTE — Patient Instructions (Addendum)
Your MRI is scheduled for Monday, June 17 at 2:00pm.   Please arrive to [facility], first floor admitting by 1:45pm.  [phone].

## 2012-01-06 ENCOUNTER — Ambulatory Visit (HOSPITAL_COMMUNITY)
Admission: RE | Admit: 2012-01-06 | Discharge: 2012-01-06 | Disposition: A | Discharge status: Home / Self Care | Payer: 59 | Source: Ambulatory Visit | Attending: Neurology | Admitting: Neurology

## 2012-01-06 DIAGNOSIS — G9389 Other specified disorders of brain: Secondary | ICD-10-CM | POA: Insufficient documentation

## 2012-01-06 DIAGNOSIS — R569 Unspecified convulsions: Secondary | ICD-10-CM

## 2012-01-06 DIAGNOSIS — G930 Cerebral cysts: Secondary | ICD-10-CM | POA: Insufficient documentation

## 2012-01-06 MED ORDER — GADOBENATE DIMEGLUMINE 529 MG/ML IV SOLN
20.0000 mL | Freq: Once | INTRAVENOUS | Status: AC
  Administered 2012-01-06: 20 mL via INTRAVENOUS

## 2012-01-07 ENCOUNTER — Ambulatory Visit (INDEPENDENT_AMBULATORY_CARE_PROVIDER_SITE_OTHER): Payer: 59 | Admitting: Family Medicine

## 2012-01-07 ENCOUNTER — Encounter: Payer: Self-pay | Admitting: Family Medicine

## 2012-01-07 VITALS — BP 132/80 | HR 67 | Temp 98.3°F | Wt 189.8 lb

## 2012-01-07 DIAGNOSIS — D72819 Decreased white blood cell count, unspecified: Secondary | ICD-10-CM

## 2012-01-07 DIAGNOSIS — R569 Unspecified convulsions: Secondary | ICD-10-CM

## 2012-01-07 DIAGNOSIS — F528 Other sexual dysfunction not due to a substance or known physiological condition: Secondary | ICD-10-CM

## 2012-01-07 DIAGNOSIS — I10 Essential (primary) hypertension: Secondary | ICD-10-CM

## 2012-01-07 DIAGNOSIS — Z0000 Encounter for general adult medical examination without abnormal findings: Secondary | ICD-10-CM | POA: Insufficient documentation

## 2012-01-07 MED ORDER — SILDENAFIL CITRATE 100 MG PO TABS: 50.0000 mg | ORAL_TABLET | Freq: Every day | ORAL | Status: AC | PRN

## 2012-01-07 MED ORDER — AMLODIPINE BESYLATE 5 MG PO TABS: 5.0000 mg | ORAL_TABLET | Freq: Every day | ORAL | Status: DC

## 2012-01-07 NOTE — Assessment & Plan Note (Signed)
Restart viagra and fu prn.  

## 2012-01-07 NOTE — Progress Notes (Signed)
CPE- See plan.  Routine anticipatory guidance given to patient.  See health maintenance. PSA wnl.   Colon 2010 Tetanus 2010 Flu shot encouraged.  He has a living will.    Seizure disorder.  Followed by neuro, no events since starting meds and no ADE.  Doing well.   ED.  Would like to try viagra.  Asking about options and discussed.    Hypertension:    Using medication without problems or lightheadedness: yes Chest pain with exertion:no Edema:no Short of breath:no Fit, still exercising.    PMH and SH reviewed  Meds, vitals, and allergies reviewed.   ROS: See HPI.  Otherwise negative.    GEN: nad, alert and oriented HEENT: mucous membranes moist NECK: supple w/o LA CV: rrr. PULM: ctab, no inc wob ABD: soft, +bs EXT: no edema SKIN: no acute rash Prostate gland firm and smooth, no enlargement, nodularity, tenderness, mass, asymmetry or induration.

## 2012-01-07 NOTE — Assessment & Plan Note (Signed)
Per neuro, doing well.

## 2012-01-07 NOTE — Assessment & Plan Note (Signed)
Again noted, stable, not likely to be significant as patient is feeling well.

## 2012-01-07 NOTE — Patient Instructions (Addendum)
I would get a flu shot each fall.   Check with your insurance to see if they will cover the shingles shot. Take care.  Call with questions.  Recheck in 1 year at a physical.  Glad to see you.

## 2012-01-07 NOTE — Assessment & Plan Note (Signed)
Controlled, continue current meds, diet, exercise.

## 2012-01-08 ENCOUNTER — Telehealth: Payer: Self-pay | Admitting: Neurology

## 2012-01-08 NOTE — Telephone Encounter (Signed)
Spoke with Juan Wagner. Information given as per Dr. [name] below. No other concerns voiced at this time.

## 2012-01-08 NOTE — Telephone Encounter (Signed)
Message copied by Benay Spice on Wed Jan 08, 2012 10:07 AM ------      Message from: Milas Gain      Created: Tue Jan 07, 2012  5:01 PM       Let Mr. Weekley know his MRI was unchanged which is great news.

## 2012-05-18 ENCOUNTER — Encounter (HOSPITAL_COMMUNITY): Payer: Self-pay | Admitting: Emergency Medicine

## 2012-05-18 ENCOUNTER — Emergency Department (HOSPITAL_COMMUNITY): Payer: 59

## 2012-05-18 ENCOUNTER — Emergency Department (HOSPITAL_COMMUNITY)
Admission: EM | Admit: 2012-05-18 | Discharge: 2012-05-18 | Disposition: A | Discharge status: Home / Self Care | Payer: 59 | Attending: Emergency Medicine | Admitting: Emergency Medicine

## 2012-05-18 ENCOUNTER — Telehealth: Payer: Self-pay | Admitting: Family Medicine

## 2012-05-18 ENCOUNTER — Telehealth: Payer: Self-pay | Admitting: Neurology

## 2012-05-18 DIAGNOSIS — E785 Hyperlipidemia, unspecified: Secondary | ICD-10-CM | POA: Insufficient documentation

## 2012-05-18 DIAGNOSIS — G40909 Epilepsy, unspecified, not intractable, without status epilepticus: Secondary | ICD-10-CM | POA: Insufficient documentation

## 2012-05-18 DIAGNOSIS — I10 Essential (primary) hypertension: Secondary | ICD-10-CM | POA: Insufficient documentation

## 2012-05-18 DIAGNOSIS — R32 Unspecified urinary incontinence: Secondary | ICD-10-CM | POA: Insufficient documentation

## 2012-05-18 DIAGNOSIS — R569 Unspecified convulsions: Secondary | ICD-10-CM

## 2012-05-18 DIAGNOSIS — Z87898 Personal history of other specified conditions: Secondary | ICD-10-CM | POA: Insufficient documentation

## 2012-05-18 DIAGNOSIS — Z7982 Long term (current) use of aspirin: Secondary | ICD-10-CM | POA: Insufficient documentation

## 2012-05-18 DIAGNOSIS — Z79899 Other long term (current) drug therapy: Secondary | ICD-10-CM | POA: Insufficient documentation

## 2012-05-18 LAB — URINALYSIS, ROUTINE W REFLEX MICROSCOPIC
Glucose, UA: NEGATIVE mg/dL
Ketones, ur: NEGATIVE mg/dL
Leukocytes, UA: NEGATIVE
Protein, ur: NEGATIVE mg/dL
Urobilinogen, UA: 0.2 mg/dL (ref 0.0–1.0)

## 2012-05-18 LAB — CBC WITH DIFFERENTIAL/PLATELET
Basophils Relative: 0 % (ref 0–1)
Eosinophils Absolute: 0 10*3/uL (ref 0.0–0.7)
Eosinophils Relative: 1 % (ref 0–5)
Lymphs Abs: 0.8 10*3/uL (ref 0.7–4.0)
MCH: 30.1 pg (ref 26.0–34.0)
MCHC: 34.5 g/dL (ref 30.0–36.0)
MCV: 87.1 fL (ref 78.0–100.0)
Neutrophils Relative %: 68 % (ref 43–77)
Platelets: 195 10*3/uL (ref 150–400)
RBC: 4.59 MIL/uL (ref 4.22–5.81)
RDW: 11.9 % (ref 11.5–15.5)

## 2012-05-18 LAB — POCT I-STAT, CHEM 8
BUN: 12 mg/dL (ref 6–23)
Calcium, Ion: 1.27 mmol/L (ref 1.13–1.30)
Creatinine, Ser: 1 mg/dL (ref 0.50–1.35)
Hemoglobin: 15 g/dL (ref 13.0–17.0)
TCO2: 27 mmol/L (ref 0–100)

## 2012-05-18 MED ORDER — SODIUM CHLORIDE 0.9 % IV SOLN
1000.0000 mg | Freq: Once | INTRAVENOUS | Status: AC
  Administered 2012-05-18: 1000 mg via INTRAVENOUS
  Filled 2012-05-18: qty 10

## 2012-05-18 MED ORDER — LEVETIRACETAM 500 MG PO TABS: 500.0000 mg | ORAL_TABLET | Freq: Two times a day (BID) | ORAL | Status: DC

## 2012-05-18 NOTE — Telephone Encounter (Signed)
Pt's wife states that he has had another seizure. Please call Armando Reichert back and advise.

## 2012-05-18 NOTE — Telephone Encounter (Signed)
Lady left v/m tried to get appt with [facility] neurology and could not get appt until next week; lady request earlier appt and  advised to ck with [facility]. Lady request our office to try to get sooner appt at [facility]. Please call [phone] with info.

## 2012-05-18 NOTE — Telephone Encounter (Signed)
Ordered

## 2012-05-18 NOTE — Telephone Encounter (Signed)
Shirlee Limerick- please call pt and see if referral is needed.  Let me know if needed.  Thanks.

## 2012-05-18 NOTE — ED Notes (Signed)
Pt arrived by ems with reports of 4 min seizure per family. Pt bit tounge and was incontinent. Pt was post ictal upon ems arrival. Pt had difficulty answering questions originally. This is 2nd seizure pt has ever had; last one 1 year ago. Pt taking medications for seizures. Vs wnl. Pt is alert and oriented x4.

## 2012-05-18 NOTE — ED Provider Notes (Signed)
History     CSN: [id number]  Arrival date & time 05/18/12  0223   First MD Initiated Contact with Patient 05/18/12 0258      Chief Complaint  Patient presents with  . Seizures    (Consider location/radiation/quality/duration/timing/severity/associated sxs/prior treatment) Patient is a 61 y.o. male presenting with seizures. The history is provided by the patient. No language interpreter was used.  Seizures  This is a recurrent problem. The current episode started less than 1 hour ago. The problem has been resolved. There was 1 seizure. The most recent episode lasted 2 to 5 minutes. Pertinent negatives include no speech difficulty and no neck stiffness. Characteristics include bladder incontinence, rhythmic jerking, loss of consciousness and bit tongue. The episode was witnessed. There was no sensation of an aura present. The seizures did not continue in the ED. The seizure(s) had no focality. Possible causes do not include med or dosage change, sleep deprivation, missed seizure meds, recent illness or change in alcohol use. There has been no fever. There were no medications administered prior to arrival.    Past Medical History  Diagnosis Date  . Hyperlipidemia   . Hypertension   . Seizures     1st event 03/2011  . ED (erectile dysfunction)     Past Surgical History  Procedure Date  . Septoplasty     broken nose    Family History  Problem Relation Age of Onset  . Hypertension Mother   . Cancer Mother     ovarian CA  chemo 2010 remission  . Alcohol abuse Father   . Cancer Father   . Throat cancer Father   . Cancer Sister     pancreatic CA Stage IV,mets to lungs  . Cirrhosis Brother     smoke and drugs  . Cancer Brother     lung cancer  . Hypertension Maternal Grandmother   . Hypertension Maternal Grandfather   . Prostate cancer Neg Hx   . Colon cancer Neg Hx     History  Substance Use Topics  . Smoking status: Never Smoker   . Smokeless tobacco: Never Used  .  Alcohol Use: No      Review of Systems  Genitourinary: Positive for bladder incontinence.  Neurological: Positive for seizures and loss of consciousness. Negative for speech difficulty.  All other systems reviewed and are negative.    Allergies  Review of patient's allergies indicates no known allergies.  Home Medications   Current Outpatient Rx  Name Route Sig Dispense Refill  . AMLODIPINE BESYLATE 5 MG PO TABS Oral Take 1 tablet (5 mg total) by mouth daily. 90 tablet 3  . ASPIRIN 81 MG PO TABS Oral Take 81 mg by mouth daily.      Marland Kitchen ONE-DAILY MULTI VITAMINS PO TABS Oral Take 1 tablet by mouth daily.      Marland Kitchen OXCARBAZEPINE 150 MG PO TABS Oral Take 450 mg by mouth 2 (two) times daily.      BP 144/98  Pulse 90  Temp 97.8 F (36.6 C) (Oral)  Resp 18  SpO2 98%  Physical Exam  Constitutional: He is oriented to person, place, and time. He appears well-developed and well-nourished.  HENT:  Head: Normocephalic and atraumatic.  Mouth/Throat: Oropharynx is clear and moist.  Eyes: Conjunctivae normal are normal. Pupils are equal, round, and reactive to light.  Neck: Normal range of motion. Neck supple.  Cardiovascular: Normal rate and regular rhythm.   Pulmonary/Chest: Effort normal and breath sounds normal. He has  no wheezes. He has no rales.  Abdominal: Soft. Bowel sounds are normal. There is no tenderness. There is no rebound and no guarding.  Musculoskeletal: Normal range of motion.  Neurological: He is alert and oriented to person, place, and time.  Skin: Skin is warm and dry.  Psychiatric: He has a normal mood and affect.    ED Course  Procedures (including critical care time)  Labs Reviewed  CBC WITH DIFFERENTIAL - Abnormal; Notable for the following:    Monocytes Relative 15 (*)     All other components within normal limits  URINALYSIS, ROUTINE W REFLEX MICROSCOPIC - Abnormal; Notable for the following:    APPearance HAZY (*)     All other components within normal  limits  POCT I-STAT, CHEM 8 - Abnormal; Notable for the following:    Glucose, Bld 107 (*)     All other components within normal limits   Ct Head Wo Contrast  05/18/2012  *RADIOLOGY REPORT*  Clinical Data: Seizure.  CT HEAD WITHOUT CONTRAST  Technique:  Contiguous axial images were obtained from the base of the skull through the vertex without contrast.  Comparison: Brain MRI 01/06/2012 and head CT scan 03/30/2011.  Findings: There is an unchanged small cyst in the right hippocampus.  The brain otherwise appears normal without evidence of infarct, hemorrhage, mass lesion, mass effect, midline shift or abnormal extra-axial fluid collection.  No hydrocephalus or pneumocephalus.  The calvarium is intact.  IMPRESSION: No acute finding.  Stable compared to prior exams.   Original Report Authenticated By: Bernadene Bell. Maricela Curet, M.D.      No diagnosis found.    MDM  Case d/w Dr. Olin Pia of neuro, load with keppra and start keppra 500 mg bid and follow up this week with neurology       Malakie Balis K Stephanye Finnicum-Rasch, MD 05/18/12 [id number]

## 2012-05-18 NOTE — Telephone Encounter (Signed)
Please put referral in for Neurology for this patient. I called the patient and told him we could do a referral for him to [facility] to get him a new general Neurologist. He does want to go there instead of seeing Dr Tat and I explained that it could take a while to get in there and he understood.  Will send all Dr Maurice March notes as well as the ED notes.

## 2012-05-18 NOTE — Telephone Encounter (Signed)
Called and spoke with the patient's wife, Juan Wagner. She states her husband had a sz during the night last night and she took him to the ER. The ER AVS stated that they were to see Marin Shutter at [facility] in [place]. She was wanting to see Dr. Arbutus Leas today (Dr. Arbutus Leas is out of the office today). I explained the current situation stating Dr. Arbutus Leas was a movement specialist and that there was no reference in Dr. Nash Dimmer last office note (June 2013) recommending that he see Dr. Arbutus Leas but that I would be happy to set up an appointment next week for him to see her. She was going to call over the Dr. Oliva Bustard office and see what she could find out from them regarding an appointment. She states she will call be back and let me know. Stressed no driving for at least 3 months.

## 2012-05-18 NOTE — Telephone Encounter (Signed)
Caller: Marian/Spouse; Patient Name: Juan Wagner; PCP: Crawford Givens Clelia Croft) [facility]); Best Callback Phone Number: [phone]:  Patient seen in [facility] ER early this am for seizure.  Discharged home.  Advised to schedule appointment with Neurologist:  Dr. Lurena Joiner Tat.  Caller has left message with this office but unsure if he will need referral from PCP.  Has also seen Dr. Mignon Pine in years past.  PLEASE F/U WITH CALLER IF REFERRAL IS NEEDED.

## 2012-05-18 NOTE — ED Notes (Signed)
Pt informed that he is not to drive or operate heavy machinery until he is cleared by a neurologist. Pt verbalized understanding of these instructions along with his medication regimen.

## 2012-05-20 NOTE — Telephone Encounter (Signed)
Reviewed notes in EPIC and the patient agreed to and was referred to [facility] by his PCP.

## 2012-05-21 ENCOUNTER — Other Ambulatory Visit: Payer: Self-pay

## 2012-05-21 DIAGNOSIS — G40909 Epilepsy, unspecified, not intractable, without status epilepticus: Secondary | ICD-10-CM

## 2012-05-21 DIAGNOSIS — G939 Disorder of brain, unspecified: Secondary | ICD-10-CM

## 2012-05-26 ENCOUNTER — Ambulatory Visit
Admission: RE | Admit: 2012-05-26 | Discharge: 2012-05-26 | Disposition: A | Discharge status: Home / Self Care | Payer: 59 | Source: Ambulatory Visit

## 2012-05-26 DIAGNOSIS — G40909 Epilepsy, unspecified, not intractable, without status epilepticus: Secondary | ICD-10-CM

## 2012-05-26 DIAGNOSIS — G939 Disorder of brain, unspecified: Secondary | ICD-10-CM

## 2012-05-26 MED ORDER — GADOBENATE DIMEGLUMINE 529 MG/ML IV SOLN
16.0000 mL | Freq: Once | INTRAVENOUS | Status: AC | PRN
  Administered 2012-05-26: 16 mL via INTRAVENOUS

## 2012-06-01 ENCOUNTER — Ambulatory Visit: Payer: Self-pay | Admitting: Neurology

## 2012-06-04 ENCOUNTER — Telehealth: Payer: Self-pay

## 2012-06-04 NOTE — Telephone Encounter (Signed)
Pt request records re:wbc sent to Dr Malvin Johns neurologist at [facility]; advised pt needs to sign record release for records to be sent to Dr Malvin Johns. Pt will come to office today and sign release.

## 2012-06-23 ENCOUNTER — Ambulatory Visit: Payer: Self-pay | Admitting: Neurology

## 2012-07-02 ENCOUNTER — Ambulatory Visit: Payer: Self-pay | Admitting: Neurology

## 2012-08-12 ENCOUNTER — Ambulatory Visit: Payer: Self-pay | Admitting: Neurology

## 2012-08-12 LAB — CREATININE, SERUM: EGFR (Non-African Amer.): >60

## 2012-10-19 ENCOUNTER — Encounter: Payer: Self-pay | Admitting: Family Medicine

## 2012-10-19 ENCOUNTER — Ambulatory Visit (INDEPENDENT_AMBULATORY_CARE_PROVIDER_SITE_OTHER): Payer: 59 | Admitting: Family Medicine

## 2012-10-19 VITALS — BP 162/110 | HR 72 | Temp 97.6°F | Wt 194.0 lb

## 2012-10-19 DIAGNOSIS — I10 Essential (primary) hypertension: Secondary | ICD-10-CM

## 2012-10-19 MED ORDER — AMLODIPINE BESYLATE 5 MG PO TABS: 10.0000 mg | ORAL_TABLET | Freq: Every day | ORAL | Status: DC

## 2012-10-19 NOTE — Assessment & Plan Note (Signed)
Elevated today, will inc amlodipine to 10mg  and have him recheck BP at home. Will get labs from [facility] neurolog[facility].

## 2012-10-19 NOTE — Progress Notes (Signed)
He was seen at neuro clinic Friday and had blood drawn (requesting results). BP was up there and on home checks in interval.  Up again today.  Compliant with amlodipine.  No CP, SOB, BLE edema.  Weight isn't sig changed. No more salt than typical. BP had prev been controlled. He can check BP at home in the next few days.  He is still active.   Meds, vitals, and allergies reviewed.   ROS: See HPI.  Otherwise, noncontributory.  GEN: nad, alert and oriented HEENT: mucous membranes moist NECK: supple w/o LA CV: rrr PULM: ctab, no inc wob ABD: soft, +bs EXT: no edema SKIN: no acute rash

## 2012-10-19 NOTE — Patient Instructions (Signed)
Take two amlodipine pills at a time (10mg  total).  Let me know about your BP later in the week. We'll request your labs from neurology.  Take care.

## 2012-10-27 ENCOUNTER — Telehealth: Payer: Self-pay | Admitting: Family Medicine

## 2012-10-27 NOTE — Telephone Encounter (Signed)
Patient not available.  Wife took message and says she will relay the info to the patient.

## 2012-10-27 NOTE — Telephone Encounter (Signed)
Spoke with patient.  He will be seen at 2:45 pm tomorrow.

## 2012-10-27 NOTE — Telephone Encounter (Signed)
Call pt.  Get update on BP.  Labs from neuro noted- was just his trileptal level.   We may or may not need repeat blood work depending on his BP.  Thanks.

## 2012-10-27 NOTE — Telephone Encounter (Signed)
Will see pt tomorrow.  I question the validity of the last BP reading, esp with the higher dose of amlodipine.

## 2012-10-27 NOTE — Telephone Encounter (Signed)
Pt will start to take BP today; today BP at 12 noon taken at fire station was 180/140; pt is sure BP was 180 /140 in both arms. Pt cannot take BP at home. No h/a, dizziness,SOB or CP  pt is presently taking amlodipine 5 mg  Taking one tablet bid; pt wants to know if should take both pills at same time.Please advise.

## 2012-10-27 NOTE — Telephone Encounter (Signed)
Dr Para March said OK to take amlodipine 5 mg two tabs at same time; wants pt seen on 10/28/12 at 2:45 pm and will do lab at that appt also. Pt voiced understanding. Pt will call back if condition changes or worsens. [place] said [place] fire dept and will have BP recked and call back with BP.

## 2012-10-28 ENCOUNTER — Encounter: Payer: Self-pay | Admitting: Family Medicine

## 2012-10-28 ENCOUNTER — Ambulatory Visit (INDEPENDENT_AMBULATORY_CARE_PROVIDER_SITE_OTHER): Payer: 59 | Admitting: Family Medicine

## 2012-10-28 VITALS — BP 128/102 | HR 67 | Temp 97.6°F | Wt 193.0 lb

## 2012-10-28 DIAGNOSIS — I10 Essential (primary) hypertension: Secondary | ICD-10-CM

## 2012-10-28 NOTE — Patient Instructions (Signed)
Go to the lab on the way out.  We'll contact you with your lab report.  Check your BP a few times and let me know about the numbers. We'll be in touch at that point.

## 2012-10-28 NOTE — Progress Notes (Signed)
Hypertension:  Using medication without problems or lightheadedness: yes Chest pain with exertion:no Edema:no Short of breath:no Average home BPs: see prev notes.   Up to 10mg  of amlodipine a day.  He has been active in the interval.  Still working out frequently.   BMET is pending.   Checked his BP this AM at home. Was 140/120 at home this AM.    Meds, vitals, and allergies reviewed.   PMH and SH reviewed  ROS: See HPI.  Otherwise negative.    GEN: nad, alert and oriented HEENT: mucous membranes moist NECK: supple w/o LA CV: rrr. PULM: ctab, no inc wob ABD: soft, +bs EXT: no edema SKIN: no acute rash

## 2012-10-28 NOTE — Assessment & Plan Note (Signed)
Continue as is.  Check BMET today.  He'll notify me about his BP readings in the next few days.  We can adjust meds at that point if needed. He agrees.

## 2012-10-29 LAB — BASIC METABOLIC PANEL
BUN: 16 mg/dL (ref 6–23)
Calcium: 9.4 mg/dL (ref 8.4–10.5)
GFR: 88.76 mL/min (ref >60.00)
Glucose, Bld: 81 mg/dL (ref 70–99)
Sodium: 139 mEq/L (ref 135–145)

## 2012-11-16 ENCOUNTER — Telehealth: Payer: Self-pay | Admitting: Family Medicine

## 2012-11-16 MED ORDER — LISINOPRIL 10 MG PO TABS: 10.0000 mg | ORAL_TABLET | Freq: Every day | ORAL | Status: DC

## 2012-11-16 NOTE — Telephone Encounter (Signed)
Patient advised.

## 2012-11-16 NOTE — Telephone Encounter (Signed)
Please call pt. BP is improved but still up some- I would add on lisinopril 10mg  a day and have him drop off some more BP readings in about 2 weeks. If lightheaded in the interval, then cut back to 5mg  a day.  Thanks. rx sent.  And continue amlodipine at current dose.

## 2012-11-16 NOTE — Telephone Encounter (Signed)
LM with wife for pt to return call.  

## 2012-11-20 ENCOUNTER — Other Ambulatory Visit: Payer: Self-pay | Admitting: *Deleted

## 2012-11-20 MED ORDER — AMLODIPINE BESYLATE 5 MG PO TABS: 10.0000 mg | ORAL_TABLET | Freq: Every day | ORAL | Status: DC

## 2013-01-18 ENCOUNTER — Ambulatory Visit (INDEPENDENT_AMBULATORY_CARE_PROVIDER_SITE_OTHER): Payer: 59 | Admitting: Family Medicine

## 2013-01-18 ENCOUNTER — Encounter: Payer: Self-pay | Admitting: Family Medicine

## 2013-01-18 VITALS — BP 128/78 | HR 72 | Temp 98.3°F | Wt 192.5 lb

## 2013-01-18 DIAGNOSIS — R05 Cough: Secondary | ICD-10-CM

## 2013-01-18 NOTE — Progress Notes (Signed)
Nagging dry cough "that doesn't impede my breathing."  Occ, not regular.  "Like a tickle in my throat."  No phelgm.  No FCNAVD. Still running for exercise.  BP has been controlled. No wheeze.  No heartburn.  Occ taste of acid in mouth with a hard coughing episode.  Brief episodes.  He'll have about 1 episode per day.  This had been going on for about 6 weeks.  He six reached a plateau and haven't consistently worsened.  No h/o asthma.  No smoker.   Meds, vitals, and allergies reviewed.   ROS: See HPI.  Otherwise, noncontributory.  GEN: nad, alert and oriented, occ dry cough HEENT: mucous membranes moist NECK: supple w/o LA CV: rrr. PULM: ctab, no inc wob ABD: soft, +bs EXT: no edema SKIN: no acute rash

## 2013-01-18 NOTE — Patient Instructions (Addendum)
Stop the lisinopril for now.  Let me know about the cough in the next two weeks and then we can make plans about your BP.  Take care.  Glad to see you.

## 2013-01-19 DIAGNOSIS — R05 Cough: Secondary | ICD-10-CM | POA: Insufficient documentation

## 2013-01-19 DIAGNOSIS — R059 Cough, unspecified: Secondary | ICD-10-CM | POA: Insufficient documentation

## 2013-01-19 NOTE — Assessment & Plan Note (Signed)
Likely ACE induced, ddx d/w pt.  Stop ACE and report back in about 2 weeks, sooner if needed.  Likely will need low dose ARB.  D/w pt.  He agrees. Nontoxic.

## 2013-01-30 ENCOUNTER — Ambulatory Visit: Payer: Self-pay | Admitting: Neurology

## 2013-01-30 LAB — CREATININE, SERUM: EGFR (Non-African Amer.): >60

## 2013-05-10 ENCOUNTER — Other Ambulatory Visit: Payer: Self-pay | Admitting: Family Medicine

## 2013-06-18 IMAGING — CT CT HEAD W/O CM
1 of 2 series · 16 of 30 positions shown, 20 images · non-contrast
Comparison: Brain MRI 01/06/2012 and head CT scan 03/30/2011.

CLINICAL DATA: Seizure.

CT HEAD WITHOUT CONTRAST
TECHNIQUE: Contiguous axial images were obtained from the base of
the skull through the vertex without contrast.

[Series 3: recon 2: brain · axial · 0.47mm/px · z∈[+152,+293]mm · 16 of 64 slices shown, 20 images]
[im 4/64  brain]
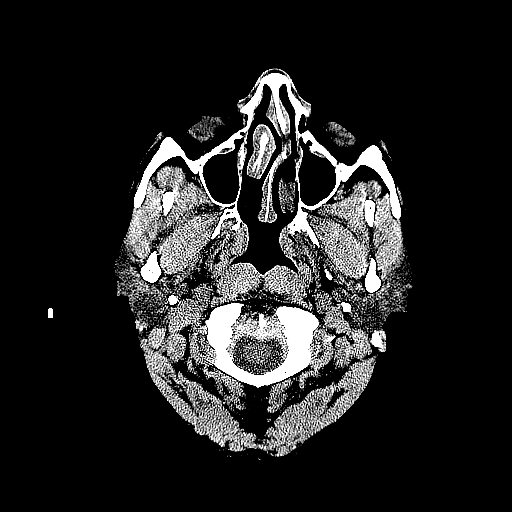
[im 4/64  bone]
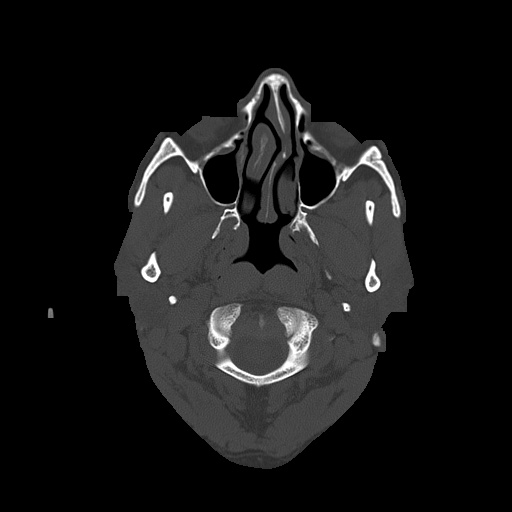
[im 7/64  brain]
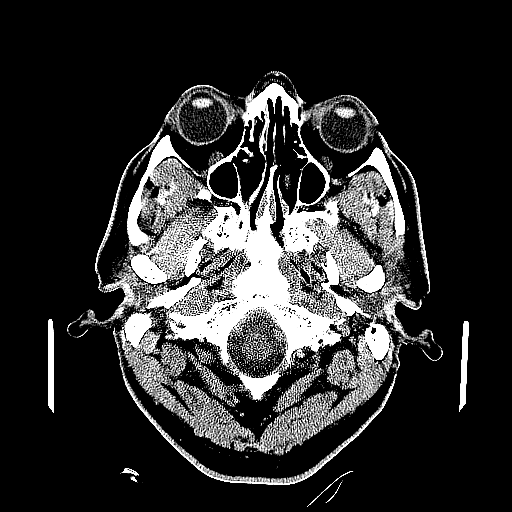
[im 10/64  brain]
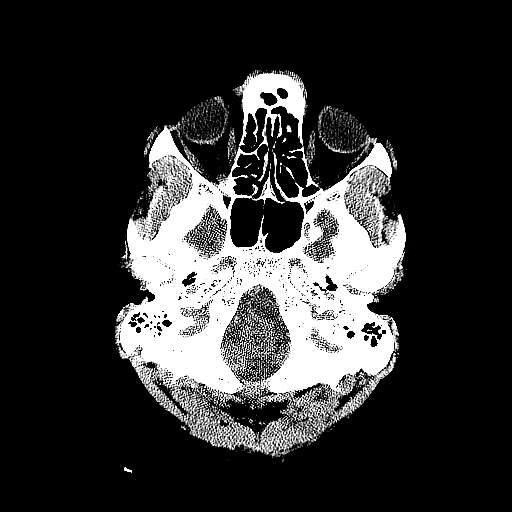
[im 14/64  brain]
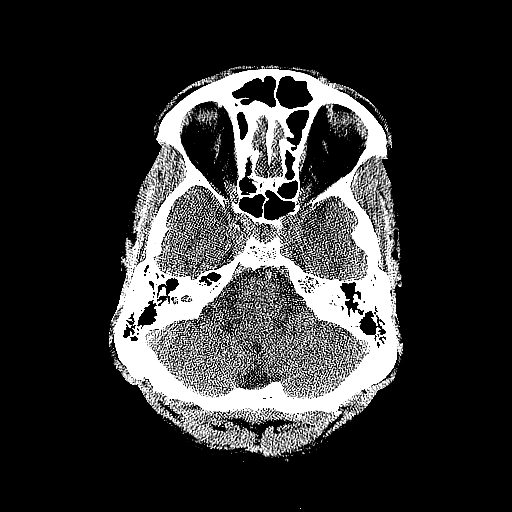
[im 20/64  brain]
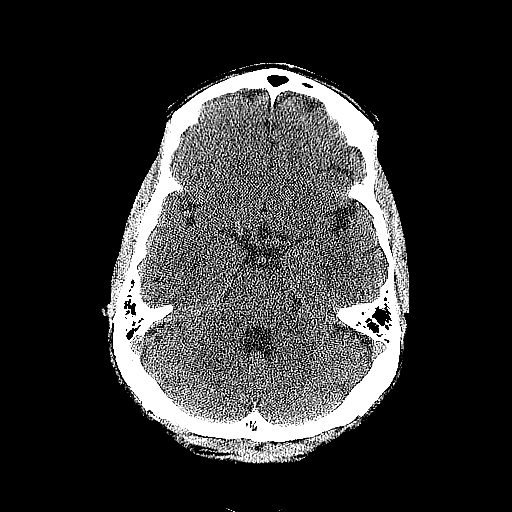
[im 20/64  bone]
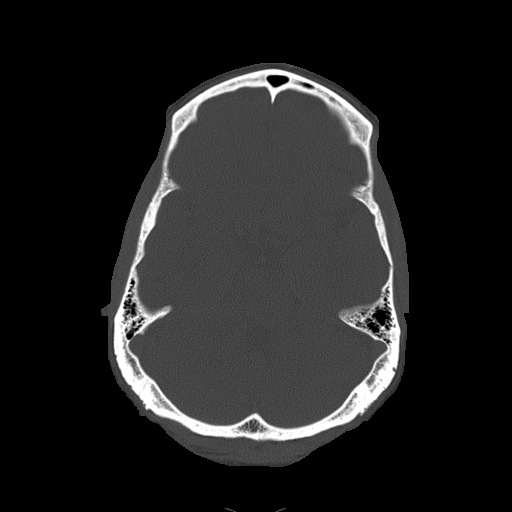
[im 24/64  brain]
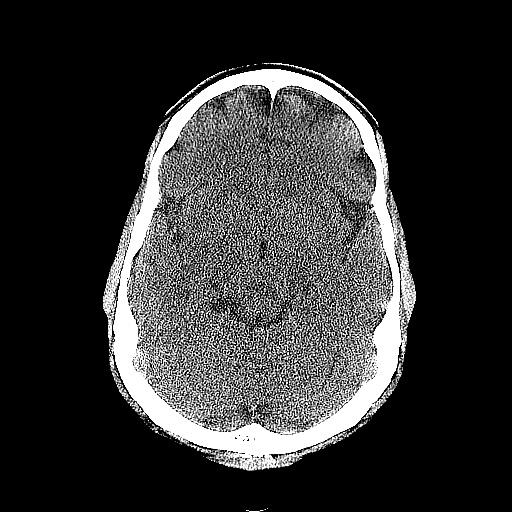
[im 27/64  brain]
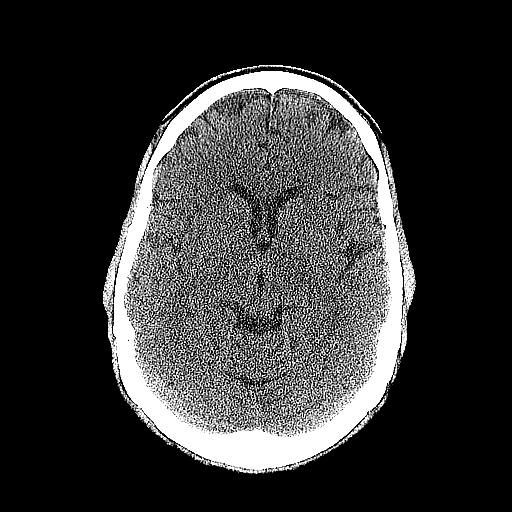
[im 30/64  brain]
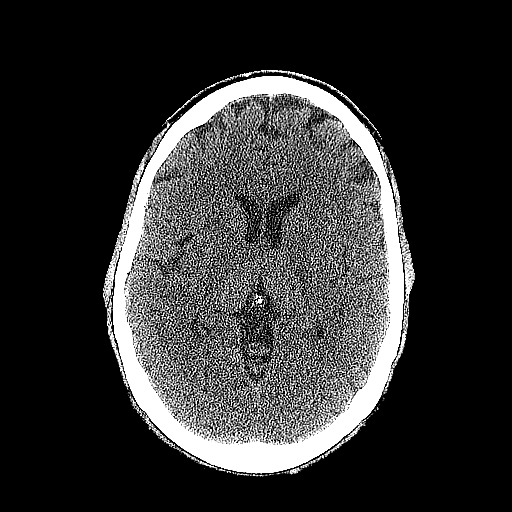
[im 34/64  brain]
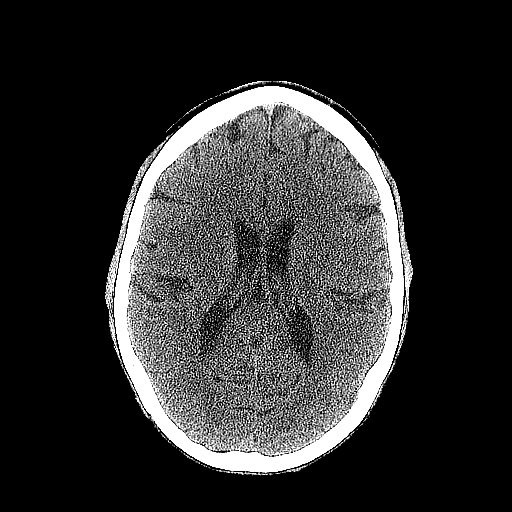
[im 34/64  bone]
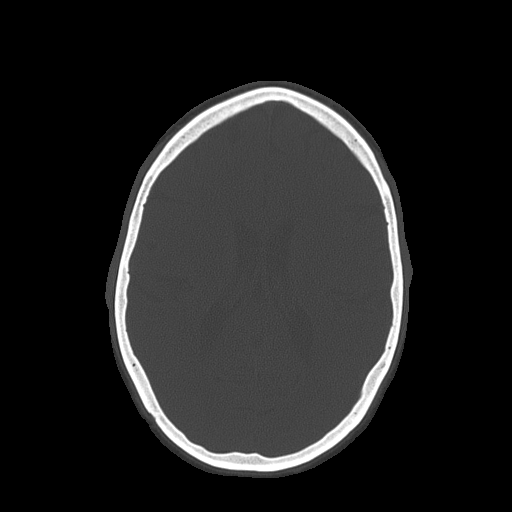
[im 37/64  brain]
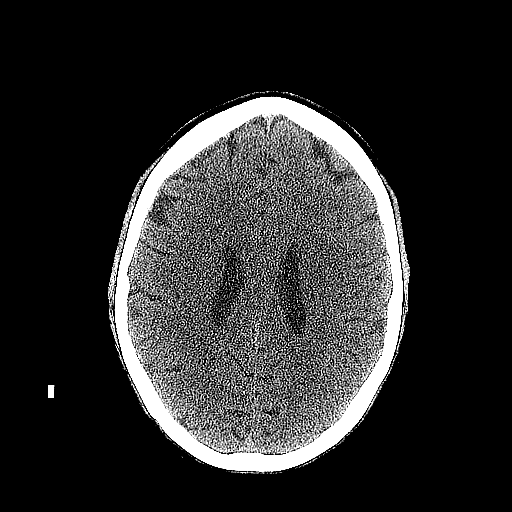
[im 40/64  brain]
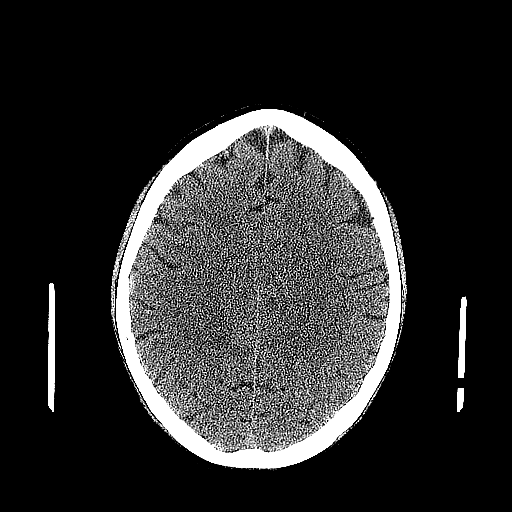
[im 44/64  brain]
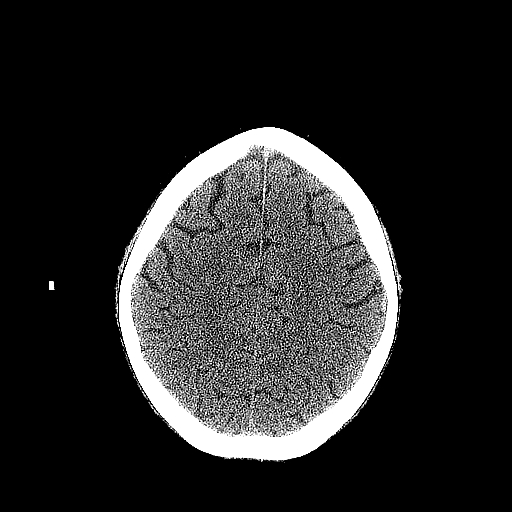
[im 50/64  brain]
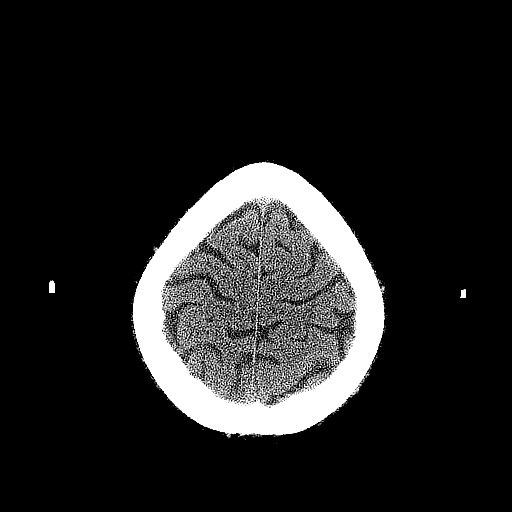
[im 50/64  bone]
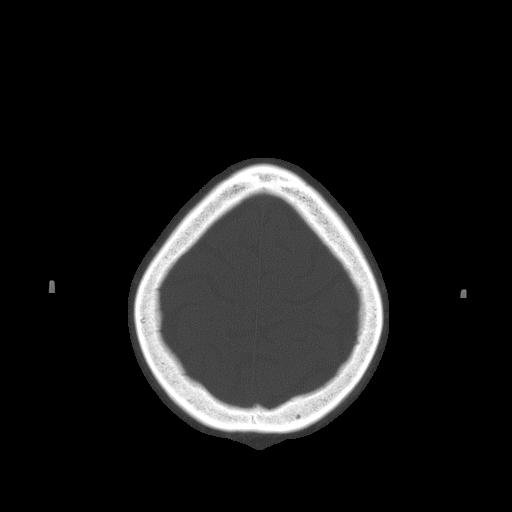
[im 54/64  brain]
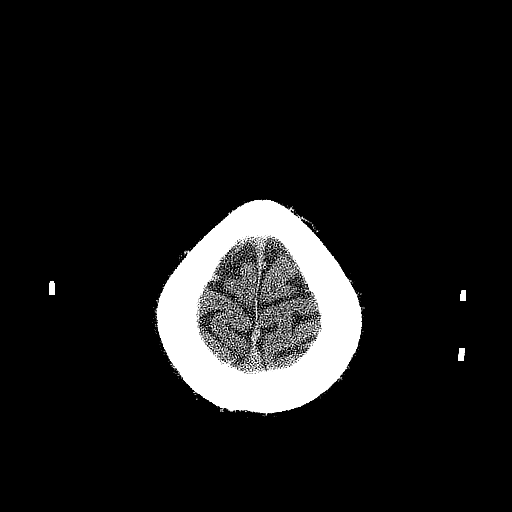
[im 57/64  brain]
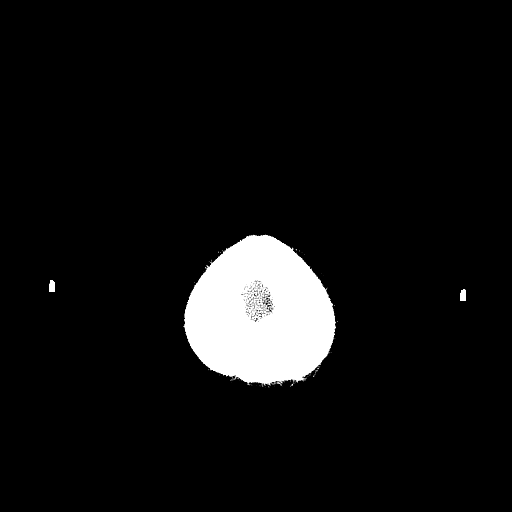
[im 60/64  brain]
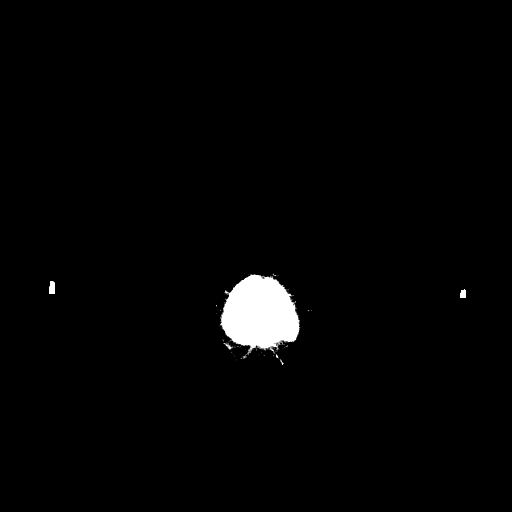

[16 of 30 positions shown; findings below may reference images not displayed]

FINDINGS: There is an unchanged small cyst in the right
hippocampus.  The brain otherwise appears normal without evidence
of infarct, hemorrhage, mass lesion, mass effect, midline shift or
abnormal extra-axial fluid collection.  No hydrocephalus or
pneumocephalus.  The calvarium is intact.
IMPRESSION: No acute finding.  Stable compared to prior exams.

## 2013-08-16 ENCOUNTER — Other Ambulatory Visit: Payer: Self-pay | Admitting: Family Medicine

## 2013-11-07 ENCOUNTER — Other Ambulatory Visit: Payer: Self-pay | Admitting: Family Medicine

## 2013-11-07 DIAGNOSIS — I10 Essential (primary) hypertension: Secondary | ICD-10-CM

## 2013-11-07 DIAGNOSIS — Z125 Encounter for screening for malignant neoplasm of prostate: Secondary | ICD-10-CM

## 2013-11-12 ENCOUNTER — Other Ambulatory Visit: Payer: 59

## 2013-11-12 ENCOUNTER — Other Ambulatory Visit (INDEPENDENT_AMBULATORY_CARE_PROVIDER_SITE_OTHER): Payer: 59

## 2013-11-12 DIAGNOSIS — Z125 Encounter for screening for malignant neoplasm of prostate: Secondary | ICD-10-CM

## 2013-11-12 DIAGNOSIS — I10 Essential (primary) hypertension: Secondary | ICD-10-CM

## 2013-11-12 LAB — LIPID PANEL
CHOLESTEROL: 188 mg/dL (ref 0–200)
HDL: 67.7 mg/dL (ref >39.00)
LDL Cholesterol: 114 mg/dL — ABNORMAL HIGH (ref 0–99)
TRIGLYCERIDES: 32 mg/dL (ref 0.0–149.0)
Total CHOL/HDL Ratio: 3
VLDL: 6.4 mg/dL (ref 0.0–40.0)

## 2013-11-12 LAB — COMPREHENSIVE METABOLIC PANEL
ALBUMIN: 4.2 g/dL (ref 3.5–5.2)
ALK PHOS: 89 U/L (ref 39–117)
ALT: 24 U/L (ref 0–53)
AST: 33 U/L (ref 0–37)
BUN: 16 mg/dL (ref 6–23)
CALCIUM: 9.9 mg/dL (ref 8.4–10.5)
CHLORIDE: 103 meq/L (ref 96–112)
CO2: 27 meq/L (ref 19–32)
Creatinine, Ser: 1.2 mg/dL (ref 0.4–1.5)
GFR: 79.87 mL/min (ref >60.00)
GLUCOSE: 79 mg/dL (ref 70–99)
POTASSIUM: 4.9 meq/L (ref 3.5–5.1)
SODIUM: 137 meq/L (ref 135–145)
TOTAL PROTEIN: 7.4 g/dL (ref 6.0–8.3)
Total Bilirubin: 0.5 mg/dL (ref 0.3–1.2)

## 2013-11-12 LAB — PSA: PSA: 1.24 ng/mL (ref 0.10–4.00)

## 2013-11-17 ENCOUNTER — Other Ambulatory Visit: Payer: Self-pay | Admitting: Family Medicine

## 2013-11-17 NOTE — Telephone Encounter (Signed)
Sent!

## 2013-11-17 NOTE — Telephone Encounter (Signed)
Received refill request from pharmacy electronically. Patient has appointment scheduled for 11/19/13. Is it okay to refill medication?

## 2013-11-19 ENCOUNTER — Encounter: Payer: Self-pay | Admitting: Family Medicine

## 2013-11-19 ENCOUNTER — Ambulatory Visit (INDEPENDENT_AMBULATORY_CARE_PROVIDER_SITE_OTHER): Payer: 59 | Admitting: Family Medicine

## 2013-11-19 VITALS — BP 142/84 | HR 68 | Temp 98.0°F | Ht 69.5 in | Wt 190.8 lb

## 2013-11-19 DIAGNOSIS — I10 Essential (primary) hypertension: Secondary | ICD-10-CM

## 2013-11-19 DIAGNOSIS — R569 Unspecified convulsions: Secondary | ICD-10-CM

## 2013-11-19 DIAGNOSIS — Z0000 Encounter for general adult medical examination without abnormal findings: Secondary | ICD-10-CM

## 2013-11-19 MED ORDER — AMLODIPINE BESYLATE 10 MG PO TABS: 10.0000 mg | ORAL_TABLET | Freq: Every day | ORAL | Status: DC

## 2013-11-19 NOTE — Assessment & Plan Note (Signed)
Reasonable control, continue as is. Very healthy diet and is exercising.  Doing well.

## 2013-11-19 NOTE — Patient Instructions (Addendum)
Check with your insurance to see if they will cover the shingles shot. Take care.  Glad to see you.   

## 2013-11-19 NOTE — Assessment & Plan Note (Signed)
Per neuro 

## 2013-11-19 NOTE — Assessment & Plan Note (Signed)
Routine anticipatory guidance given to patient. See health maintenance.  Tetanus 2010  Flu shot encouraged.  Shingle and PNA shot d/w pt.  Colonoscopy 2010  Living will d/w pt. Wife designated if patient were incapacitated.  Diet and exercise- doing well on both. 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  PSA wnl.

## 2013-11-19 NOTE — Progress Notes (Signed)
Pre visit review using our clinic review tool, if applicable. No additional management support is needed unless otherwise documented below in the visit note.  CPE- See plan.  Routine anticipatory guidance given to patient.  See health maintenance. Tetanus 2010 Flu shot encouraged.  Shingle and PNA shot d/w pt.  Colonoscopy 2010 Living will d/w pt.  Wife designated if patient were incapacitated.  Diet and exercise- doing well on both.  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   PSA wnl.  Hypertension:    Using medication without problems or lightheadedness: yes Chest pain with exertion:no Edema:no Short of breath:no  Has f/u with neuro pending. No more seizures in the meantime.   PMH and SH reviewed  Meds, vitals, and allergies reviewed.   ROS: See HPI.  Otherwise negative.    GEN: nad, alert and oriented HEENT: mucous membranes moist NECK: supple w/o LA CV: rrr. PULM: ctab, no inc wob ABD: soft, +bs EXT: no edema SKIN: no acute rash CN 2-12 wnl B, S/S/DTR wnl x4

## 2013-11-22 ENCOUNTER — Telehealth: Payer: Self-pay | Admitting: Family Medicine

## 2013-11-22 NOTE — Telephone Encounter (Signed)
Relevant patient education assigned to patient using Emmi. ° °

## 2013-12-28 ENCOUNTER — Encounter: Payer: Self-pay | Admitting: Gastroenterology

## 2014-11-28 DIAGNOSIS — R569 Unspecified convulsions: Secondary | ICD-10-CM | POA: Diagnosis not present

## 2014-11-28 DIAGNOSIS — G4730 Sleep apnea, unspecified: Secondary | ICD-10-CM | POA: Diagnosis not present

## 2014-11-28 DIAGNOSIS — I10 Essential (primary) hypertension: Secondary | ICD-10-CM | POA: Diagnosis not present

## 2014-12-08 ENCOUNTER — Ambulatory Visit (INDEPENDENT_AMBULATORY_CARE_PROVIDER_SITE_OTHER): Payer: Medicare Other | Admitting: Family Medicine

## 2014-12-08 ENCOUNTER — Encounter: Payer: Self-pay | Admitting: Family Medicine

## 2014-12-08 VITALS — BP 124/80 | HR 56 | Temp 97.7°F | Ht 70.0 in | Wt 188.8 lb

## 2014-12-08 DIAGNOSIS — Z125 Encounter for screening for malignant neoplasm of prostate: Secondary | ICD-10-CM

## 2014-12-08 DIAGNOSIS — Z0000 Encounter for general adult medical examination without abnormal findings: Secondary | ICD-10-CM | POA: Diagnosis not present

## 2014-12-08 DIAGNOSIS — I10 Essential (primary) hypertension: Secondary | ICD-10-CM | POA: Diagnosis not present

## 2014-12-08 DIAGNOSIS — Z23 Encounter for immunization: Secondary | ICD-10-CM

## 2014-12-08 DIAGNOSIS — Z7189 Other specified counseling: Secondary | ICD-10-CM

## 2014-12-08 LAB — COMPREHENSIVE METABOLIC PANEL
ALBUMIN: 4.2 g/dL (ref 3.5–5.2)
ALT: 20 U/L (ref 0–53)
AST: 26 U/L (ref 0–37)
Alkaline Phosphatase: 93 U/L (ref 39–117)
BUN: 16 mg/dL (ref 6–23)
CALCIUM: 10.1 mg/dL (ref 8.4–10.5)
CHLORIDE: 102 meq/L (ref 96–112)
CO2: 31 meq/L (ref 19–32)
CREATININE: 1.15 mg/dL (ref 0.40–1.50)
GFR: 82 mL/min (ref >60.00)
Glucose, Bld: 102 mg/dL — ABNORMAL HIGH (ref 70–99)
POTASSIUM: 4.6 meq/L (ref 3.5–5.1)
SODIUM: 138 meq/L (ref 135–145)
TOTAL PROTEIN: 7.7 g/dL (ref 6.0–8.3)
Total Bilirubin: 0.4 mg/dL (ref 0.2–1.2)

## 2014-12-08 LAB — LIPID PANEL
CHOLESTEROL: 196 mg/dL (ref 0–200)
HDL: 76.4 mg/dL (ref >39.00)
LDL Cholesterol: 110 mg/dL — ABNORMAL HIGH (ref 0–99)
NONHDL: 119.6
Total CHOL/HDL Ratio: 3
Triglycerides: 50 mg/dL (ref 0.0–149.0)
VLDL: 10 mg/dL (ref 0.0–40.0)

## 2014-12-08 LAB — PSA: PSA: 1.49 ng/mL (ref 0.10–4.00)

## 2014-12-08 MED ORDER — AMLODIPINE BESYLATE 10 MG PO TABS: 10.0000 mg | ORAL_TABLET | Freq: Every day | ORAL | Status: DC

## 2014-12-08 NOTE — Patient Instructions (Addendum)
Check with your insurance to see if they will cover the shingles shot. I would get a flu shot each fall.   Call about a colonoscopy (Dr. Fuller Plan).  Take care.  Glad to see you.  Recheck in about 1 year.

## 2014-12-08 NOTE — Progress Notes (Signed)
Pre visit review using our clinic review tool, if applicable. No additional management support is needed unless otherwise documented below in the visit note.  I have personally reviewed the Medicare Annual Wellness questionnaire and have noted 1. The patient's medical and social history 2. Their use of alcohol, tobacco or illicit drugs 3. Their current medications and supplements 4. The patient's functional ability including ADL's, fall risks, home safety risks and hearing or visual             impairment. 5. Diet and physical activities 6. Evidence for depression or mood disorders  The patients weight, height, BMI have been recorded in the chart and visual acuity is per eye clinic.  I have made referrals, counseling and provided education to the patient based review of the above and I have provided the pt with a written personalized care plan for preventive services.  Provider list updated- see scanned forms.  Routine anticipatory guidance given to patient.  See health maintenance.  Flu d/w pt Shingles d/w pt PNA d/w pt Tetanus 2010 Colonoscopy 2010.  He'll call about follow up Prostate cancer screening and PSA options (with potential risks and benefits of testing vs not testing) were discussed along with recent recs/guidelines.  He accepted testing PSA at this point. Advance directive- wife designated if patient were incapacitated.   Cognitive function addressed- see scanned forms- and if abnormal then additional documentation follows.   Hypertension:    Using medication without problems or lightheadedness: yes Chest pain with exertion:no Edema:no Short of breath:no  SZ d/o per neuro.  No events.  No ADE on meds. Doing well.    PMH and SH reviewed  Meds, vitals, and allergies reviewed.   ROS: See HPI.  Otherwise negative.    GEN: nad, alert and oriented HEENT: mucous membranes moist NECK: supple w/o LA CV: rrr. PULM: ctab, no inc wob ABD: soft, +bs EXT: no edema SKIN:  no acute rash

## 2014-12-09 ENCOUNTER — Encounter: Payer: Self-pay | Admitting: *Deleted

## 2014-12-09 DIAGNOSIS — Z7189 Other specified counseling: Secondary | ICD-10-CM | POA: Insufficient documentation

## 2014-12-09 NOTE — Assessment & Plan Note (Signed)
Controlled, continue as is.  See notes on labs.  Doing well.

## 2014-12-09 NOTE — Assessment & Plan Note (Signed)
Flu d/w pt Shingles d/w pt PNA d/w pt Tetanus 2010 Colonoscopy 2010.  He'll call about follow up Prostate cancer screening and PSA options (with potential risks and benefits of testing vs not testing) were discussed along with recent recs/guidelines.  He accepted testing PSA at this point. Advance directive- wife designated if patient were incapacitated.   Cognitive function addressed- see scanned forms- and if abnormal then additional documentation follows.

## 2015-05-12 ENCOUNTER — Ambulatory Visit: Payer: Medicare Other | Admitting: Family Medicine

## 2015-05-15 ENCOUNTER — Encounter: Payer: Self-pay | Admitting: Family Medicine

## 2015-05-15 ENCOUNTER — Ambulatory Visit (INDEPENDENT_AMBULATORY_CARE_PROVIDER_SITE_OTHER): Payer: Medicare Other | Admitting: Family Medicine

## 2015-05-15 VITALS — BP 126/82 | HR 88 | Temp 99.0°F | Wt 185.0 lb

## 2015-05-15 DIAGNOSIS — R05 Cough: Secondary | ICD-10-CM

## 2015-05-15 DIAGNOSIS — R059 Cough, unspecified: Secondary | ICD-10-CM

## 2015-05-15 MED ORDER — AZITHROMYCIN 250 MG PO TABS: ORAL_TABLET | ORAL | Status: DC

## 2015-05-15 NOTE — Patient Instructions (Signed)
Start zithromax and try to get some rest.  Drink plenty of fluids.   I would get a flu shot each fall (when you get to feeling better). Take care. Glad to see you.

## 2015-05-15 NOTE — Progress Notes (Signed)
Pre visit review using our clinic review tool, if applicable. No additional management support is needed unless otherwise documented below in the visit note.  Cough, sinus drainage.  Post nasal gtt.  Sputum with the cough.  Had been going on for a few weeks.  He doesn't feel awful, but this isn't typical for him with the cough.  His sleep was disrupted, but some better now.  No fevers.  Still able to exercise with the cough.  He doesn't have allergies, esp this time of year.  No facial pain.  No wheeze.    Meds, vitals, and allergies reviewed.   ROS: See HPI.  Otherwise, noncontributory.  GEN: nad, alert and oriented HEENT: mucous membranes moist, tm w/o erythema, nasal exam w/o erythema, scant clear discharge noted,  OP with mild cobblestoning NECK: supple w/o LA CV: rrr.   PULM: ctab, no inc wob EXT: no edema SKIN: no acute rash

## 2015-05-16 DIAGNOSIS — R059 Cough, unspecified: Secondary | ICD-10-CM | POA: Insufficient documentation

## 2015-05-16 DIAGNOSIS — R05 Cough: Secondary | ICD-10-CM | POA: Insufficient documentation

## 2015-05-16 NOTE — Assessment & Plan Note (Signed)
Start zithromax and try to get some rest. Drink plenty of fluids. Nontoxic.   Okay for outpatient f/u.   Thought sinuses not ttp, would presume upper source and would treat given the duration and historical absence of allergy sx.

## 2015-05-22 ENCOUNTER — Ambulatory Visit (INDEPENDENT_AMBULATORY_CARE_PROVIDER_SITE_OTHER): Payer: Medicare Other | Admitting: Family Medicine

## 2015-05-22 ENCOUNTER — Encounter: Payer: Self-pay | Admitting: Family Medicine

## 2015-05-22 VITALS — BP 124/88 | HR 78 | Temp 98.5°F | Ht 70.0 in | Wt 190.0 lb

## 2015-05-22 DIAGNOSIS — S76112A Strain of left quadriceps muscle, fascia and tendon, initial encounter: Secondary | ICD-10-CM

## 2015-05-22 NOTE — Progress Notes (Signed)
Pre visit review using our clinic review tool, if applicable. No additional management support is needed unless otherwise documented below in the visit note. 

## 2015-05-22 NOTE — Progress Notes (Signed)
Dr. Frederico Hamman T. Rocquel Askren, MD, [facility] and Sports Medicine [place], [address] Phone: [phone] Fax: [phone]  05/22/2015  Patient: Juan Wagner, MRN: [id number], DOB: 04-01-1951, 64 y.o.  Primary Physician:  Elsie Stain, MD   Chief Complaint  Patient presents with  . Leg Injury    Hurt during [employer] [place] last week   Subjective:   Juan Wagner is a 64 y.o. 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 Was able to get up and finish.   He developed some swelling in his left side on his quadricep anteriorly.  There is no bruising.  There is no palpable defect.  It does hurt with compression of this area.  He has been wearing a compressive sleeve, which seems to help quite a bit.  Past Medical History, Surgical History, Social History, Family History, Problem List, Medications, and Allergies have been reviewed and updated if relevant.  Patient Active Problem List   Diagnosis Date Noted  . Cough 05/16/2015  . Advance care planning 12/09/2014  . Medicare annual wellness visit, initial 01/07/2012  . Seizure ([place]) 04/01/2011  . LEUKOPENIA, MILD 12/27/2009  . ERECTILE DYSFUNCTION 02/11/2008  . Essential hypertension 01/29/2007  . 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 Hyperlipidemia   . Hypertension   . Seizures ([place])     1st event 03/2011  . ED (erectile dysfunction)     Past Surgical History  Procedure Laterality Date  . Septoplasty      broken nose    Social History   Social History  . Marital Status: Married    Spouse Name: N/A  . Number of Children: N/A  . Years of Education: N/A   Occupational History  . Not on file.   Social History Main Topics  . Smoking status: Never Smoker   . Smokeless tobacco: Never Used  . Alcohol Use: No  . Drug Use: No  . Sexual Activity: Not on file   Other  Topics Concern  . Not on file   Social History Narrative   Married ~40 years   2 kids   Retired from [employer] in [place], works with recruits as of 2015    Family History  Problem Relation Age of Onset  . Hypertension Mother   . Cancer Mother     ovarian CA  chemo 2010 remission  . Breast cancer Mother   . Alcohol abuse Father   . Cancer Father   . Throat cancer Father   . Cancer Sister     pancreatic CA Stage IV,mets to lungs  . Cirrhosis Brother     smoke and drugs  . Cancer Brother     lung cancer  . Hypertension Maternal Grandmother   . Hypertension Maternal Grandfather   . Prostate cancer Neg Hx   . Colon cancer Neg Hx     No Known Allergies  Medication list reviewed and updated in full in [place].  GEN: No fevers, chills. Nontoxic. Primarily MSK c/o today. MSK: Detailed in the HPI GI: tolerating PO intake without difficulty Neuro: No numbness, parasthesias, or tingling associated. Otherwise the pertinent positives of the ROS are noted above.   Objective:   BP 124/88 mmHg  Pulse 78  Temp(Src) 98.5 F (36.9 C) (Oral)  Ht 5\' 10"  (1.778 m)  Wt 190 lb (86.183 kg)  BMI 27.26 kg/m2   GEN: WDWN, NAD, Non-toxic, Alert & Oriented x 3 HEENT: Atraumatic, Normocephalic.  Ears and Nose: No external deformity. EXTR: No clubbing/cyanosis/edema NEURO: Normal gait.  PSYCH: Normally interactive. Conversant. Not depressed or anxious appearing.  Calm demeanor.    Left knee, full range of motion without any significant problems or difficulties.  Hamstring strength is intact at 5/5.  Quadricep, anteriorly there is some swelling and pain and palpation without a palpable defect and no clear bruising at this point.  His most tender in the region of the rectus femoris.  Radiology: No results found.  Assessment and Plan:   Quadriceps strain, left, initial encounter  Grade 1 and quadriceps tear, more likely rectus femoris.  Reviewed basic care,  compression, ice, gentle rehabilitation and return to sport.  Follow-up prn  Signed,  Frederico Hamman T. Paiton Boultinghouse, MD   Patient's Medications  New Prescriptions   No medications on file  Previous Medications   AMLODIPINE (NORVASC) 10 MG TABLET    Take 1 tablet (10 mg total) by mouth daily.   ASPIRIN 81 MG TABLET    Take 81 mg by mouth daily.     MULTIPLE VITAMIN (MULTIVITAMIN) TABLET    Take 1 tablet by mouth daily.     OXCARBAZEPINE (TRILEPTAL) 600 MG TABLET    Take 600 mg by mouth 2 (two) times daily.  Modified Medications   No medications on file  Discontinued Medications   AZITHROMYCIN (ZITHROMAX) 250 MG TABLET    2 tabs a day for 1 day and then 1 a day for 4 days.   PSEUDOEPH-DOXYLAMINE-DM-APAP (NYQUIL PO)    Take by mouth as needed.

## 2015-06-08 ENCOUNTER — Other Ambulatory Visit: Payer: Self-pay | Admitting: *Deleted

## 2015-06-08 NOTE — Telephone Encounter (Signed)
Faxed refill request. Last Filled:   ?   Please advise.  

## 2015-06-09 ENCOUNTER — Telehealth: Payer: Self-pay | Admitting: Family Medicine

## 2015-06-09 NOTE — Telephone Encounter (Signed)
Message left for patient to return my call.  

## 2015-06-09 NOTE — Telephone Encounter (Signed)
Pt returned your call, please call back thanks

## 2015-06-09 NOTE — Telephone Encounter (Signed)
Pt returned your call.  

## 2015-06-09 NOTE — Telephone Encounter (Signed)
Please verify with patient.  I'm okay filling this but I want to make sure it is still coming through the usual clinic.  I though neuro was still filling this.  If not, then I'll send it in.  Thanks.

## 2015-06-14 ENCOUNTER — Encounter: Payer: Self-pay | Admitting: Family Medicine

## 2015-06-14 ENCOUNTER — Ambulatory Visit (INDEPENDENT_AMBULATORY_CARE_PROVIDER_SITE_OTHER): Payer: Medicare Other | Admitting: Family Medicine

## 2015-06-14 VITALS — BP 147/96 | HR 56 | Temp 98.3°F | Ht 70.0 in | Wt 188.2 lb

## 2015-06-14 DIAGNOSIS — S76112A Strain of left quadriceps muscle, fascia and tendon, initial encounter: Secondary | ICD-10-CM

## 2015-06-14 NOTE — Progress Notes (Signed)
Dr. Frederico Hamman T. Wallace Cogliano, MD, [facility] Primary Care and Sports Medicine [place], [address] Phone: [phone] Fax: [phone]  06/14/2015  Patient: Juan Wagner, MRN: [id number], DOB: 12-18-1950, 64 y.o.  Primary Physician:  Elsie Stain, MD   Chief Complaint  Patient presents with  . Follow-up    Leg Pain   Subjective:   Juan Wagner is a 64 y.o. very pleasant male patient who presents with the following:  F/u quad injury: I remember him well from just short of a month ago when he had a left leg quadricep musculature injury.  Bruising and swelling has diminished, but he still has some slight change in strength and he has had some wasting of his quadricep muscle over the interval time.  He is basically just here to talk to me get some advice about this injury and further assistance with follow-up care.  L leg.   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   He developed some swelling in his left side on his quadricep anteriorly.  There is no bruising.  There is no palpable defect.  It does hurt with compression of this area.  He has been wearing a compressive sleeve, which seems to help quite a bit.  Past Medical History, Surgical History, Social History, Family History, Problem List, Medications, and Allergies have been reviewed and updated if relevant.  Patient Active Problem List   Diagnosis Date Noted  . Cough 05/16/2015  . Advance care planning 12/09/2014  . Medicare annual wellness visit, initial 01/07/2012  . Seizure ([place]) 04/01/2011  . LEUKOPENIA, MILD 12/27/2009  . ERECTILE DYSFUNCTION 02/11/2008  . Essential hypertension 01/29/2007  . 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 Hyperlipidemia   . Hypertension   . Seizures ([place])     1st event 03/2011  . ED (erectile dysfunction)     Past Surgical History    Procedure Laterality Date  . Septoplasty      broken nose    Social History   Social History  . Marital Status: Married    Spouse Name: N/A  . Number of Children: N/A  . Years of Education: N/A   Occupational History  . Not on file.   Social History Main Topics  . Smoking status: Never Smoker   . Smokeless tobacco: Never Used  . Alcohol Use: No  . Drug Use: No  . Sexual Activity: Not on file   Other Topics Concern  . Not on file   Social History Narrative   Married ~40 years   2 kids   Retired from [employer] in [place], works with recruits as of 2015    Family History  Problem Relation Age of Onset  . Hypertension Mother   . Cancer Mother     ovarian CA  chemo 2010 remission  . Breast cancer Mother   . Alcohol abuse Father   . Cancer Father   . Throat cancer Father   . Cancer Sister     pancreatic CA Stage IV,mets to lungs  . Cirrhosis Brother     smoke and drugs  . Cancer Brother     lung cancer  . Hypertension Maternal Grandmother   . Hypertension Maternal Grandfather   . Prostate cancer Neg Hx   . Colon cancer Neg Hx     No  Known Allergies  Medication list reviewed and updated in full in [place].  GEN: No fevers, chills. Nontoxic. Primarily MSK c/o today. MSK: Detailed in the HPI GI: tolerating PO intake without difficulty Neuro: No numbness, parasthesias, or tingling associated. Otherwise the pertinent positives of the ROS are noted above.   Objective:   BP 147/96 mmHg  Pulse 56  Temp(Src) 98.3 F (36.8 C) (Oral)  Ht 5\' 10"  (1.778 m)  Wt 188 lb 4 oz (85.39 kg)  BMI 27.01 kg/m2   GEN: WDWN, NAD, Non-toxic, Alert & Oriented x 3 HEENT: Atraumatic, Normocephalic.  Ears and Nose: No external deformity. EXTR: No clubbing/cyanosis/edema NEURO: Normal gait.  PSYCH: Normally interactive. Conversant. Not depressed or anxious appearing.  Calm demeanor.    Left knee, full range of motion without any significant problems or  difficulties.  Hamstring strength is intact at 5/5.  L Quadricep, anteriorly there is no swelling and mild pain and small midthigh defect on palpation and no clear bruising at this point.  His most tender in the region of the rectus femoris.  Intervally, he has had some L thigh wasting.  Radiology: No results found.  Assessment and Plan:   Quadriceps strain, left, initial encounter - Plan: Ambulatory referral to Physical Therapy  Mild quad wasting, still represents a grade 1 injury.  Probably rectus femoris.  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  Follow-up: prn  Orders Placed This Encounter  Procedures  . Ambulatory referral to Physical Therapy   Patient Instructions  Hip Rehab:  Hip Flexion: Toe up to ceiling, laying on your back. Lift your whole leg, 3 sets. Work up to being able to do #30 with each set.  Hip elevations, Toe and leg turned out to side.  Lift whole leg, 3 sets. Work up to being able to do #30 with each set.  Hip Abductions: Lying on side, straight out to side. 3 sets, work up to being able to do #30 with each set.  At the beginning you may only be able to do a lot less, try to do #10.      Signed,  Maud Deed. Blayze Haen, MD   Patient's Medications  New Prescriptions   No medications on file  Previous Medications   AMLODIPINE (NORVASC) 10 MG TABLET    Take 1 tablet (10 mg total) by mouth daily.   ASPIRIN 81 MG TABLET    Take 81 mg by mouth daily.     MULTIPLE VITAMIN (MULTIVITAMIN) TABLET    Take 1 tablet by mouth daily.     OXCARBAZEPINE (TRILEPTAL) 600 MG TABLET    Take 600 mg by mouth 2 (two) times daily.  Modified Medications   No medications on file  Discontinued Medications   No medications on file

## 2015-06-14 NOTE — Progress Notes (Signed)
Pre visit review using our clinic review tool, if applicable. No additional management support is needed unless otherwise documented below in the visit note. 

## 2015-06-14 NOTE — Patient Instructions (Signed)
Hip Rehab:  Hip Flexion: Toe up to ceiling, laying on your back. Lift your whole leg, 3 sets. Work up to being able to do #30 with each set.  Hip elevations, Toe and leg turned out to side.  Lift whole leg, 3 sets. Work up to being able to do #30 with each set.  Hip Abductions: Lying on side, straight out to side. 3 sets, work up to being able to do #30 with each set.  At the beginning you may only be able to do a lot less, try to do #10.  

## 2015-06-19 ENCOUNTER — Telehealth: Payer: Self-pay | Admitting: Family Medicine

## 2015-06-19 NOTE — Telephone Encounter (Signed)
LVM for pt to call back.

## 2015-06-19 NOTE — Telephone Encounter (Signed)
Pt left v/m requesting status of PT to [facility] and [place] referral. Pt did not leave contact # but requested cb.

## 2015-06-19 NOTE — Telephone Encounter (Signed)
Pt called stating he would like PT with [facility] in [place]. Fax # [phone]

## 2015-06-19 NOTE — Telephone Encounter (Signed)
Pt aware of P>T appt on Wed 06/21/15

## 2015-06-21 DIAGNOSIS — S76112D Strain of left quadriceps muscle, fascia and tendon, subsequent encounter: Secondary | ICD-10-CM | POA: Diagnosis not present

## 2015-06-21 DIAGNOSIS — S76112A Strain of left quadriceps muscle, fascia and tendon, initial encounter: Secondary | ICD-10-CM | POA: Diagnosis not present

## 2015-06-21 DIAGNOSIS — R262 Difficulty in walking, not elsewhere classified: Secondary | ICD-10-CM | POA: Diagnosis not present

## 2015-06-21 DIAGNOSIS — M25362 Other instability, left knee: Secondary | ICD-10-CM | POA: Diagnosis not present

## 2015-06-21 DIAGNOSIS — M62552 Muscle wasting and atrophy, not elsewhere classified, left thigh: Secondary | ICD-10-CM | POA: Diagnosis not present

## 2015-06-27 DIAGNOSIS — M25562 Pain in left knee: Secondary | ICD-10-CM | POA: Diagnosis not present

## 2015-06-29 DIAGNOSIS — S83242A Other tear of medial meniscus, current injury, left knee, initial encounter: Secondary | ICD-10-CM | POA: Diagnosis not present

## 2015-07-03 ENCOUNTER — Ambulatory Visit: Payer: Medicare Other | Admitting: Family Medicine

## 2015-07-03 ENCOUNTER — Other Ambulatory Visit: Payer: Self-pay | Admitting: *Deleted

## 2015-07-04 DIAGNOSIS — S83282A Other tear of lateral meniscus, current injury, left knee, initial encounter: Secondary | ICD-10-CM | POA: Diagnosis not present

## 2015-07-04 DIAGNOSIS — S86802A Unspecified injury of other muscle(s) and tendon(s) at lower leg level, left leg, initial encounter: Secondary | ICD-10-CM | POA: Diagnosis not present

## 2015-07-04 DIAGNOSIS — G8918 Other acute postprocedural pain: Secondary | ICD-10-CM | POA: Diagnosis not present

## 2015-07-04 DIAGNOSIS — S83242A Other tear of medial meniscus, current injury, left knee, initial encounter: Secondary | ICD-10-CM | POA: Diagnosis not present

## 2015-07-04 DIAGNOSIS — S83272A Complex tear of lateral meniscus, current injury, left knee, initial encounter: Secondary | ICD-10-CM | POA: Diagnosis not present

## 2015-07-04 DIAGNOSIS — S83232A Complex tear of medial meniscus, current injury, left knee, initial encounter: Secondary | ICD-10-CM | POA: Diagnosis not present

## 2015-07-04 DIAGNOSIS — M66252 Spontaneous rupture of extensor tendons, left thigh: Secondary | ICD-10-CM | POA: Diagnosis not present

## 2015-07-04 DIAGNOSIS — M94262 Chondromalacia, left knee: Secondary | ICD-10-CM | POA: Diagnosis not present

## 2015-07-11 DIAGNOSIS — S83282D Other tear of lateral meniscus, current injury, left knee, subsequent encounter: Secondary | ICD-10-CM | POA: Diagnosis not present

## 2015-07-11 DIAGNOSIS — S83242D Other tear of medial meniscus, current injury, left knee, subsequent encounter: Secondary | ICD-10-CM | POA: Diagnosis not present

## 2015-07-14 DIAGNOSIS — M62552 Muscle wasting and atrophy, not elsewhere classified, left thigh: Secondary | ICD-10-CM | POA: Diagnosis not present

## 2015-07-14 DIAGNOSIS — M25362 Other instability, left knee: Secondary | ICD-10-CM | POA: Diagnosis not present

## 2015-07-14 DIAGNOSIS — S76112D Strain of left quadriceps muscle, fascia and tendon, subsequent encounter: Secondary | ICD-10-CM | POA: Diagnosis not present

## 2015-07-14 DIAGNOSIS — R262 Difficulty in walking, not elsewhere classified: Secondary | ICD-10-CM | POA: Diagnosis not present

## 2015-07-18 DIAGNOSIS — S83282D Other tear of lateral meniscus, current injury, left knee, subsequent encounter: Secondary | ICD-10-CM | POA: Diagnosis not present

## 2015-07-18 DIAGNOSIS — S83242D Other tear of medial meniscus, current injury, left knee, subsequent encounter: Secondary | ICD-10-CM | POA: Diagnosis not present

## 2015-07-21 DIAGNOSIS — M25362 Other instability, left knee: Secondary | ICD-10-CM | POA: Diagnosis not present

## 2015-07-21 DIAGNOSIS — R262 Difficulty in walking, not elsewhere classified: Secondary | ICD-10-CM | POA: Diagnosis not present

## 2015-07-21 DIAGNOSIS — S76112D Strain of left quadriceps muscle, fascia and tendon, subsequent encounter: Secondary | ICD-10-CM | POA: Diagnosis not present

## 2015-07-21 DIAGNOSIS — M62552 Muscle wasting and atrophy, not elsewhere classified, left thigh: Secondary | ICD-10-CM | POA: Diagnosis not present

## 2015-07-26 DIAGNOSIS — R262 Difficulty in walking, not elsewhere classified: Secondary | ICD-10-CM | POA: Diagnosis not present

## 2015-07-26 DIAGNOSIS — M25362 Other instability, left knee: Secondary | ICD-10-CM | POA: Diagnosis not present

## 2015-07-26 DIAGNOSIS — S76112D Strain of left quadriceps muscle, fascia and tendon, subsequent encounter: Secondary | ICD-10-CM | POA: Diagnosis not present

## 2015-07-26 DIAGNOSIS — M62552 Muscle wasting and atrophy, not elsewhere classified, left thigh: Secondary | ICD-10-CM | POA: Diagnosis not present

## 2015-07-28 DIAGNOSIS — S76112D Strain of left quadriceps muscle, fascia and tendon, subsequent encounter: Secondary | ICD-10-CM | POA: Diagnosis not present

## 2015-07-28 DIAGNOSIS — M25362 Other instability, left knee: Secondary | ICD-10-CM | POA: Diagnosis not present

## 2015-07-28 DIAGNOSIS — M62552 Muscle wasting and atrophy, not elsewhere classified, left thigh: Secondary | ICD-10-CM | POA: Diagnosis not present

## 2015-07-28 DIAGNOSIS — R262 Difficulty in walking, not elsewhere classified: Secondary | ICD-10-CM | POA: Diagnosis not present

## 2015-08-02 DIAGNOSIS — S76112D Strain of left quadriceps muscle, fascia and tendon, subsequent encounter: Secondary | ICD-10-CM | POA: Diagnosis not present

## 2015-08-02 DIAGNOSIS — M62552 Muscle wasting and atrophy, not elsewhere classified, left thigh: Secondary | ICD-10-CM | POA: Diagnosis not present

## 2015-08-02 DIAGNOSIS — M25362 Other instability, left knee: Secondary | ICD-10-CM | POA: Diagnosis not present

## 2015-08-02 DIAGNOSIS — R262 Difficulty in walking, not elsewhere classified: Secondary | ICD-10-CM | POA: Diagnosis not present

## 2015-08-10 DIAGNOSIS — M25362 Other instability, left knee: Secondary | ICD-10-CM | POA: Diagnosis not present

## 2015-08-10 DIAGNOSIS — R262 Difficulty in walking, not elsewhere classified: Secondary | ICD-10-CM | POA: Diagnosis not present

## 2015-08-10 DIAGNOSIS — M62552 Muscle wasting and atrophy, not elsewhere classified, left thigh: Secondary | ICD-10-CM | POA: Diagnosis not present

## 2015-08-10 DIAGNOSIS — S76112D Strain of left quadriceps muscle, fascia and tendon, subsequent encounter: Secondary | ICD-10-CM | POA: Diagnosis not present

## 2015-08-15 DIAGNOSIS — M25362 Other instability, left knee: Secondary | ICD-10-CM | POA: Diagnosis not present

## 2015-08-16 DIAGNOSIS — R262 Difficulty in walking, not elsewhere classified: Secondary | ICD-10-CM | POA: Diagnosis not present

## 2015-08-16 DIAGNOSIS — M62552 Muscle wasting and atrophy, not elsewhere classified, left thigh: Secondary | ICD-10-CM | POA: Diagnosis not present

## 2015-08-16 DIAGNOSIS — S76112D Strain of left quadriceps muscle, fascia and tendon, subsequent encounter: Secondary | ICD-10-CM | POA: Diagnosis not present

## 2015-08-16 DIAGNOSIS — M25362 Other instability, left knee: Secondary | ICD-10-CM | POA: Diagnosis not present

## 2015-09-12 DIAGNOSIS — S76112D Strain of left quadriceps muscle, fascia and tendon, subsequent encounter: Secondary | ICD-10-CM | POA: Diagnosis not present

## 2015-10-17 DIAGNOSIS — S76112D Strain of left quadriceps muscle, fascia and tendon, subsequent encounter: Secondary | ICD-10-CM | POA: Diagnosis not present

## 2015-11-28 DIAGNOSIS — I10 Essential (primary) hypertension: Secondary | ICD-10-CM | POA: Diagnosis not present

## 2015-11-28 DIAGNOSIS — R569 Unspecified convulsions: Secondary | ICD-10-CM | POA: Diagnosis not present

## 2015-11-28 DIAGNOSIS — G4730 Sleep apnea, unspecified: Secondary | ICD-10-CM | POA: Diagnosis not present

## 2015-12-13 ENCOUNTER — Other Ambulatory Visit: Payer: Self-pay | Admitting: Family Medicine

## 2015-12-13 NOTE — Telephone Encounter (Signed)
Rx sent electronically.  

## 2016-01-30 ENCOUNTER — Encounter: Payer: Self-pay | Admitting: Family Medicine

## 2016-01-30 ENCOUNTER — Ambulatory Visit (INDEPENDENT_AMBULATORY_CARE_PROVIDER_SITE_OTHER): Payer: Medicare Other | Admitting: Family Medicine

## 2016-01-30 VITALS — BP 110/82 | HR 59 | Temp 98.4°F | Ht 70.0 in | Wt 189.0 lb

## 2016-01-30 DIAGNOSIS — R569 Unspecified convulsions: Secondary | ICD-10-CM

## 2016-01-30 DIAGNOSIS — Z119 Encounter for screening for infectious and parasitic diseases, unspecified: Secondary | ICD-10-CM

## 2016-01-30 DIAGNOSIS — S76112S Strain of left quadriceps muscle, fascia and tendon, sequela: Secondary | ICD-10-CM

## 2016-01-30 DIAGNOSIS — Z125 Encounter for screening for malignant neoplasm of prostate: Secondary | ICD-10-CM | POA: Diagnosis not present

## 2016-01-30 DIAGNOSIS — Z0000 Encounter for general adult medical examination without abnormal findings: Secondary | ICD-10-CM

## 2016-01-30 DIAGNOSIS — I10 Essential (primary) hypertension: Secondary | ICD-10-CM

## 2016-01-30 LAB — LIPID PANEL
Cholesterol: 217 mg/dL — ABNORMAL HIGH (ref 0–200)
HDL: 73.8 mg/dL (ref >39.00)
LDL CALC: 126 mg/dL — AB (ref 0–99)
NONHDL: 142.73
Total CHOL/HDL Ratio: 3
Triglycerides: 84 mg/dL (ref 0.0–149.0)
VLDL: 16.8 mg/dL (ref 0.0–40.0)

## 2016-01-30 LAB — COMPREHENSIVE METABOLIC PANEL
ALK PHOS: 95 U/L (ref 39–117)
ALT: 16 U/L (ref 0–53)
AST: 22 U/L (ref 0–37)
Albumin: 4.4 g/dL (ref 3.5–5.2)
BUN: 18 mg/dL (ref 6–23)
CHLORIDE: 101 meq/L (ref 96–112)
CO2: 33 mEq/L — ABNORMAL HIGH (ref 19–32)
Calcium: 10.3 mg/dL (ref 8.4–10.5)
Creatinine, Ser: 1.12 mg/dL (ref 0.40–1.50)
GFR: 84.25 mL/min (ref >60.00)
GLUCOSE: 96 mg/dL (ref 70–99)
POTASSIUM: 5 meq/L (ref 3.5–5.1)
SODIUM: 138 meq/L (ref 135–145)
TOTAL PROTEIN: 7.7 g/dL (ref 6.0–8.3)
Total Bilirubin: 0.4 mg/dL (ref 0.2–1.2)

## 2016-01-30 LAB — PSA, MEDICARE: PSA: 1.7 ng/ml (ref 0.10–4.00)

## 2016-01-30 MED ORDER — AMLODIPINE BESYLATE 10 MG PO TABS: 10.0000 mg | ORAL_TABLET | Freq: Every day | ORAL | Status: DC

## 2016-01-30 NOTE — Progress Notes (Signed)
Pre visit review using our clinic review tool, if applicable. No additional management support is needed unless otherwise documented below in the visit note.  I have personally reviewed the Medicare Annual Wellness questionnaire and have noted 1. The patient's medical and social history 2. Their use of alcohol, tobacco or illicit drugs 3. Their current medications and supplements 4. The patient's functional ability including ADL's, fall risks, home safety risks and hearing or visual             impairment. 5. Diet and physical activities 6. Evidence for depression or mood disorders  The patients weight, height, BMI have been recorded in the chart and visual acuity is per eye clinic.  I have made referrals, counseling and provided education to the patient based review of the above and I have provided the pt with a written personalized care plan for preventive services.  Provider list updated- see scanned forms.  Routine anticipatory guidance given to patient.  See health maintenance.  [facility]  Shingles d/w pt.  PNA vaccine- prev with local reaction and I would defer for now.   Tetanus 2010 Colonoscopy due, he'll call about follow up.  PSA pending.  Advance directive- wife designated if patient were incapacitated.   Cognitive function addressed- see scanned forms- and if abnormal then additional documentation follows.  Pt opts in for HCV screening.  D/w pt re: routine screening.    He can bike and run on the elipitical well but still has some discomfort running.  Biking up to 35 miles at a time.  Still with some dec in muscle mass on the L quad after repair.    Hypertension:    Using medication without problems or lightheadedness: yes Chest pain with exertion:no Edema:no Short of breath:no Due for labs.   No SZ event, compliant with med.   PMH and SH reviewed  Meds, vitals, and allergies reviewed.   ROS: Per HPI.  Unless specifically indicated otherwise  in HPI, the patient denies:  General: fever. Eyes: acute vision changes ENT: sore throat Cardiovascular: chest pain Respiratory: SOB GI: vomiting GU: dysuria Musculoskeletal: acute back pain Derm: acute rash Neuro: acute motor dysfunction Psych: worsening mood Endocrine: polydipsia Heme: bleeding Allergy: hayfever  GEN: nad, alert and oriented HEENT: mucous membranes moist NECK: supple w/o LA CV: rrr. PULM: ctab, no inc wob ABD: soft, +bs EXT: no edema SKIN: no acute rash Dec in muscle mass noted in the L quad

## 2016-01-30 NOTE — Patient Instructions (Addendum)
Check with your insurance to see if they will cover the shingles shot. Call about a colonoscopy at the GI clinic.  [phone]. Go to the lab on the way out.  We'll contact you with your lab report. Let me talk to [place] in the meantime and we'll be in touch.  Take care.  Glad to see you.

## 2016-01-31 DIAGNOSIS — S76119A Strain of unspecified quadriceps muscle, fascia and tendon, initial encounter: Secondary | ICD-10-CM | POA: Insufficient documentation

## 2016-01-31 LAB — HEPATITIS C ANTIBODY: HCV Ab: NEGATIVE

## 2016-01-31 NOTE — Assessment & Plan Note (Signed)
[facility]  Shingles d/w pt.  PNA vaccine- prev with local reaction and I would defer for now.   Tetanus 2010 Colonoscopy due, he'll call about follow up.  PSA pending.  Advance directive- wife designated if patient were incapacitated.   Cognitive function addressed- see scanned forms- and if abnormal then additional documentation follows.  Pt opts in for HCV screening.  D/w pt re: routine screening.

## 2016-01-31 NOTE — Assessment & Plan Note (Signed)
No sx, doing well.  No change in med.

## 2016-01-31 NOTE — Assessment & Plan Note (Signed)
Controlled, continue as is.  See notes on labs.   

## 2016-01-31 NOTE — Assessment & Plan Note (Signed)
I'll check on rehab options for patient.  Doing well with elliptical and biking but still with some muscle mass loss, across the quad.  He may still be able to get some of this back.  S/p PT.  He did well with PT.

## 2016-02-08 DIAGNOSIS — S76112D Strain of left quadriceps muscle, fascia and tendon, subsequent encounter: Secondary | ICD-10-CM | POA: Diagnosis not present

## 2016-02-12 DIAGNOSIS — S76112D Strain of left quadriceps muscle, fascia and tendon, subsequent encounter: Secondary | ICD-10-CM | POA: Diagnosis not present

## 2016-02-12 DIAGNOSIS — M25362 Other instability, left knee: Secondary | ICD-10-CM | POA: Diagnosis not present

## 2016-02-12 DIAGNOSIS — M62552 Muscle wasting and atrophy, not elsewhere classified, left thigh: Secondary | ICD-10-CM | POA: Diagnosis not present

## 2016-04-17 ENCOUNTER — Encounter: Payer: Self-pay | Admitting: Family Medicine

## 2016-04-17 ENCOUNTER — Ambulatory Visit (INDEPENDENT_AMBULATORY_CARE_PROVIDER_SITE_OTHER): Payer: Medicare Other | Admitting: Family Medicine

## 2016-04-17 VITALS — BP 140/90 | HR 79 | Temp 98.6°F | Ht 70.0 in | Wt 185.4 lb

## 2016-04-17 DIAGNOSIS — W540XXA Bitten by dog, initial encounter: Secondary | ICD-10-CM | POA: Diagnosis not present

## 2016-04-17 DIAGNOSIS — Z23 Encounter for immunization: Secondary | ICD-10-CM

## 2016-04-17 DIAGNOSIS — I10 Essential (primary) hypertension: Secondary | ICD-10-CM

## 2016-04-17 DIAGNOSIS — T148 Other injury of unspecified body region: Secondary | ICD-10-CM

## 2016-04-17 NOTE — Patient Instructions (Signed)
Keep area clean and dry with regular soap and water daily Leave open to air when you are able Please let us know if you develop any fever/chills, drainage from the wound.   Animal Bite Animal bites can range from mild to serious. An animal bite can result in a scratch on the skin, a deep open cut, a puncture of the skin, a crush injury, or tearing away of the skin or a body part. A small bite from a house pet will usually not cause serious problems. However, some animal bites can become infected or injure a bone or other tissue.  Bites from certain animals can be more dangerous because of the risk of spreading rabies, which is a serious viral infection. This risk is higher with bites from stray animals or wild animals, such as raccoons, foxes, skunks, and bats. Dogs are responsible for most animal bites. Children are bitten more often than adults. SYMPTOMS  Common symptoms of an animal bite include:   Pain.   Bleeding.   Swelling.   Bruising.  DIAGNOSIS  This condition may be diagnosed based on a physical exam and medical history. Your health care provider will examine the wound and ask for details about the animal and how the bite happened. You may also have tests, such as:   Blood tests to check for infection or to determine if surgery is needed.  X-rays to check for damage to bones or joints.  Culture test. This uses a sample of fluid from the wound to check for infection. TREATMENT  Treatment varies depending on the location and type of animal bite and your medical history. Treatment may include:   Wound care. This often includes cleaning the wound, flushing the wound with saline solution, and applying a bandage (dressing). Sometimes, the wound is left open to heal because of the high risk of infection. However, in some cases, the wound may be closed with stitches (sutures), staples, skin glue, or adhesive strips.   Antibiotic medicine.   Tetanus shot.   Rabies treatment  if the animal could have rabies.  In some cases, bites that have become infected may require IV antibiotics and surgical treatment in the hospital.  [place]  Follow instructions from your health care provider about how to take care of your wound. Make sure you:  Wash your hands with soap and water before you change your dressing. If soap and water are not available, use hand sanitizer.  Change your dressing as told by your health care provider.  Leave sutures, skin glue, or adhesive strips in place. These skin closures may need to be in place for 2 weeks or longer. If adhesive strip edges start to loosen and curl up, you may trim the loose edges. Do not remove adhesive strips completely unless your health care provider tells you to do that.  Check your wound every day for signs of infection. Watch for:   Increasing redness, swelling, or pain.   Fluid, blood, or pus.  General Instructions  Take or apply over-the-counter and prescription medicines only as told by your health care provider.   If you were prescribed an antibiotic, take or apply it as told by your health care provider. Do not stop using the antibiotic even if your condition improves.   Keep the injured area raised (elevated) above the level of your heart while you are sitting or lying down, if this is possible.   If directed, apply ice to the injured area.  Put ice in a plastic bag.   Place a towel between your skin and the bag.   Leave the ice on for 20 minutes, 2-3 times per day.   Keep all follow-up visits as told by your health care provider. This is important.  SEEK MEDICAL CARE IF:  You have increasing redness, swelling, or pain at the site of your wound.   You have a general feeling of sickness (malaise).   You feel nauseous or you vomit.   You have pain that does not get better.  SEEK IMMEDIATE MEDICAL CARE IF:  You have a red streak extending away  from your wound.   You have fluid, blood, or pus coming from your wound.   You have a fever or chills.   You have trouble moving your injured area.   You have numbness or tingling extending beyond the wound.   This information is not intended to replace advice given to you by your health care provider. Make sure you discuss any questions you have with your health care provider.   Document Released: 03/26/2011 Document Revised: 03/29/2015 Document Reviewed: 11/23/2014 Elsevier Interactive Patient Education [employer].

## 2016-04-17 NOTE — Progress Notes (Signed)
Subjective:    Patient ID: ANITA BOCOOK, male    DOB: Dec 24, 1950, 65 y.o.   MRN: [id number]  HPI This is a 65 yo male, accompanied by his wife, who presents today with right calf dog bite. He was bitten yesterday evening by a dog while he was riding his bike. He called emergency services who cleaned it and treated it with H2O2. A little bleeding initially. No drainage currently. No excessive pain. No difficulty sleeping last night. No medications taken for symptoms. Currently keeping wound covered. Animal control was called and the dog's owner identified. Dog is being monitored for signs of disease. According to owner, dog is up to date on immunizations.   Patient was unable to get his clip shoes out of his bike pedals and fell on the ground. He denies loc, head injury, joint injury or pain. States he has small abrasion on left hip.   Blood pressure elevated initially today. Patient reports he takes amlodipine daily and had today's dose already.   Past Medical History:  Diagnosis Date  . ED (erectile dysfunction)   . Hyperlipidemia   . Hypertension   . Seizures ([place])    1st event 03/2011   Past Surgical History:  Procedure Laterality Date  . KNEE SURGERY     scope L knee  . SEPTOPLASTY     broken nose   Family History  Problem Relation Age of Onset  . Hypertension Mother   . Cancer Mother     ovarian CA  chemo 2010 remission  . Breast cancer Mother   . Alcohol abuse Father   . Cancer Father   . Throat cancer Father   . Cancer Sister     pancreatic CA Stage IV,mets to lungs  . Cirrhosis Brother     smoke and drugs  . Cancer Brother     lung cancer  . Hypertension Maternal Grandmother   . Hypertension Maternal Grandfather   . Prostate cancer Neg Hx   . Colon cancer Neg Hx    Social History  Substance Use Topics  . Smoking status: Never Smoker  . Smokeless tobacco: Never Used  . Alcohol use No      Review of Systems Per HPI    Objective:   Physical Exam    Constitutional: He is oriented to person, place, and time. He appears well-developed and well-nourished. No distress.  HENT:  Head: Normocephalic and atraumatic.  Cardiovascular: Normal rate.   Pulmonary/Chest: Effort normal.  Musculoskeletal: Normal range of motion. He exhibits no edema.  Neurological: He is alert and oriented to person, place, and time.  Skin: Skin is warm and dry. He is not diaphoretic.     Psychiatric: He has a normal mood and affect. His behavior is normal. Judgment and thought content normal.  Vitals reviewed.     BP (!) 158/110   Pulse 79   Temp 98.6 F (37 C)   Ht 5\' 10"  (1.778 m)   Wt 185 lb 6.4 oz (84.1 kg)   SpO2 96%   BMI 26.60 kg/m  Wt Readings from Last 3 Encounters:  04/17/16 185 lb 6.4 oz (84.1 kg)  01/30/16 189 lb (85.7 kg)  06/14/15 188 lb 4 oz (85.4 kg)  Recheck blood pressure 140/90     Assessment & Plan:  1. Dog bite - Provided written and verbal information regarding diagnosis and treatment. - will hold off on antibiotic unless he develops increased redness/fever/pain/swelling or drainage - instructed to keep clean and  dry, leave open to air unless draining - Td vaccine greater than or equal to 7yo preservative free IM (offered Tdap, patient preferred Td).  - he will notify the office if dog is found to have any disease  2. Essential hypertension - Recheck BP 140/90, continue to monitor - continue amlodipine 10 mg   Clarene Reamer, FNP-BC  [facility], [place]  04/17/2016 12:51 PM

## 2016-07-09 DIAGNOSIS — H5203 Hypermetropia, bilateral: Secondary | ICD-10-CM | POA: Diagnosis not present

## 2016-07-09 DIAGNOSIS — H35033 Hypertensive retinopathy, bilateral: Secondary | ICD-10-CM | POA: Diagnosis not present

## 2016-07-09 DIAGNOSIS — I10 Essential (primary) hypertension: Secondary | ICD-10-CM | POA: Diagnosis not present

## 2016-07-09 DIAGNOSIS — H35039 Hypertensive retinopathy, unspecified eye: Secondary | ICD-10-CM | POA: Diagnosis not present

## 2017-01-02 ENCOUNTER — Ambulatory Visit (INDEPENDENT_AMBULATORY_CARE_PROVIDER_SITE_OTHER): Payer: Medicare Other | Admitting: Family Medicine

## 2017-01-02 ENCOUNTER — Encounter: Payer: Self-pay | Admitting: Family Medicine

## 2017-01-02 DIAGNOSIS — I10 Essential (primary) hypertension: Secondary | ICD-10-CM | POA: Diagnosis not present

## 2017-01-02 MED ORDER — LISINOPRIL 5 MG PO TABS: 2.5000 mg | ORAL_TABLET | Freq: Every day | ORAL | 3 refills | Status: DC

## 2017-01-02 NOTE — Patient Instructions (Signed)
Add on lisinopril, 1/2 tab a day.  If needed, increase to 1 tab a day after about 1-2 weeks.  Update me as needed.  Take care.  Glad to see you.

## 2017-01-02 NOTE — Progress Notes (Signed)
Hypertension:  Compliant with CCB but started to note elevated BP in the last 1-2 months.  No clear changes per patient report.  Still on low salt diet.  Still exercising.   Using medication without problems or lightheadedness: yes Chest pain with exertion:no Edema:no Short of breath:no No low BP readings.   His lowest BP was ~133/92, most have been higher.   Max SBP has been <150, with BP usually >90 but usually <100.    Meds, vitals, and allergies reviewed.   ROS: Per HPI unless specifically indicated in ROS section   GEN: nad, alert and oriented HEENT: mucous membranes moist NECK: supple w/o LA CV: rrr. PULM: ctab, no inc wob ABD: soft, +bs EXT: no edema SKIN: no acute rash

## 2017-01-03 NOTE — Assessment & Plan Note (Signed)
Blood pressure gradually increasing, despite up exercise and healthy diet and medicine compliance. Likely with just slowly worsening vessel compliance. Discussed with patient. Add on lisinopril, start to 2.5 mg a day, increase to 5 mg as needed. Since the patient doesn't have severe blood pressure elevations he may not need a high dose. We can adjust as we go along. His last creatinine was normal. Return in about a month. We can recheck labs at that point. He agrees. Update me as needed in the meantime.

## 2017-01-23 NOTE — Progress Notes (Signed)
PCP notes:   Health maintenance: Colonoscopy- overdue.  Abnormal screenings:  None.   Patient concerns: None   Nurse concerns: None   Next PCP appt: 02/03/17   I reviewed health advisor's note, was available for consultation on the day of service listed in this note, and agree with documentation and plan. Elsie Stain, MD.

## 2017-01-23 NOTE — Progress Notes (Signed)
Subjective:   Juan Wagner is a 66 y.o. male who presents for Medicare Annual/Subsequent preventive examination.  Review of Systems:  No ROS.  Medicare Wellness Visit. Additional risk factors are reflected in the social history.  Cardiac Risk Factors include: advanced age (>68men, >42 women);hypertension;male gender     Objective:    Vitals: BP (!) 138/102 (BP Location: Right Arm, Patient Position: Sitting, Cuff Size: Normal)   Pulse 72   Temp 97.9 F (36.6 C) (Oral)   Resp 16   Ht 5\' 8"  (1.727 m)   Wt 185 lb (83.9 kg)   SpO2 98%   BMI 28.13 kg/m   Body mass index is 28.13 kg/m.  Tobacco History  Smoking Status  . Never Smoker  Smokeless Tobacco  . Never Used     Counseling given: Not Answered   Past Medical History:  Diagnosis Date  . ED (erectile dysfunction)   . Hyperlipidemia   . Hypertension   . Seizures ([place])    1st event 03/2011   Past Surgical History:  Procedure Laterality Date  . KNEE SURGERY     scope L knee  . SEPTOPLASTY     broken nose   Family History  Problem Relation Age of Onset  . Hypertension Mother   . Cancer Mother        ovarian CA  chemo 2010 remission  . Breast cancer Mother   . Alcohol abuse Father   . Cancer Father   . Throat cancer Father   . Cancer Sister        pancreatic CA Stage IV,mets to lungs  . Cirrhosis Brother        smoke and drugs  . Cancer Brother        lung cancer  . Hypertension Maternal Grandmother   . Hypertension Maternal Grandfather   . Prostate cancer Neg Hx   . Colon cancer Neg Hx    History  Sexual Activity  . Sexual activity: Yes    Outpatient Encounter Prescriptions as of 02/03/2017  Medication Sig  . amLODipine (NORVASC) 10 MG tablet TAKE ONE TABLET BY MOUTH ONCE DAILY  . aspirin 81 MG tablet Take 81 mg by mouth daily.    Marland Kitchen lisinopril (PRINIVIL,ZESTRIL) 5 MG tablet Take 0.5-1 tablets (2.5-5 mg total) by mouth daily.  . Multiple Vitamin (MULTIVITAMIN) tablet Take 1 tablet by mouth  daily.    Marland Kitchen oxcarbazepine (TRILEPTAL) 600 MG tablet Take 600 mg by mouth 2 (two) times daily.  . [DISCONTINUED] amLODipine (NORVASC) 10 MG tablet Take 1 tablet (10 mg total) by mouth daily.   No facility-administered encounter medications on file as of 02/03/2017.     Activities of Daily Living In your present state of health, do you have any difficulty performing the following activities: 02/03/2017  Hearing? N  Vision? N  Difficulty concentrating or making decisions? N  Walking or climbing stairs? N  Dressing or bathing? N  Doing errands, shopping? N  Preparing Food and eating ? N  Using the Toilet? N  In the past six months, have you accidently leaked urine? N  Do you have problems with loss of bowel control? N  Managing your Medications? N  Managing your Finances? N  Housekeeping or managing your Housekeeping? N  Some recent data might be hidden    Patient Care Team: Tonia Ghent, MD as PCP - General (Family Medicine) Anabel Bene, MD as Referring Physician (Neurology)   Assessment:    Physical assessment  deferred to PCP.  Exercise Activities and Dietary recommendations Current Exercise Habits: Home exercise routine, Type of exercise: Other - see comments;strength training/weights;walking;stretching (cycling, 'You name it, I do it.'), Time (Minutes): 60, Frequency (Times/Week): 7, Weekly Exercise (Minutes/Week): 420, Intensity: Intense  Goals    . Increase physical activity          Starting 02/03/2017, I will continue to do a variety of different types of exercise daily.      Fall Risk Fall Risk  02/03/2017 01/30/2016 12/08/2014  Falls in the past year? No No No   Depression Screen PHQ 2/9 Scores 02/03/2017 01/30/2016 12/08/2014  PHQ - 2 Score 0 0 0    Cognitive Function PLEASE NOTE: A Mini-Cog screen was completed. Maximum score is 20. A value of 0 denotes this part of [place] MMSE was not completed or the patient failed this part of the Mini-Cog screening.     Mini-Cog Screening Orientation to Time - Max 5 pts Orientation to Place - Max 5 pts Registration - Max 3 pts Recall - Max 3 pts Language Repeat - Max 1 pts Language Follow 3 Step Command - Max 3 pts      Mini-Cog - 02/03/17 0955    Normal clock drawing test? no   How many words correct? 3      MMSE - Mini Mental State Exam 02/03/2017  Orientation to time 5  Orientation to Place 5  Registration 3  Attention/ Calculation 0  Recall 3  Language- name 2 objects 0  Language- repeat 1  Language- follow 3 step command 3  Language- read & follow direction 0  Write a sentence 0  Copy design 0  Total score 20        Immunization History  Administered Date(s) Administered  . Pneumococcal Conjugate-13 12/08/2014  . Td 12/21/2008, 04/17/2016   Screening Tests Health Maintenance  Topic Date Due  . COLONOSCOPY  02/17/2014  . INFLUENZA VACCINE  02/19/2017  . TETANUS/TDAP  04/17/2026  . Hepatitis C Screening  Completed      Plan:    Follow-up w/ PCP as scheduled.  I have personally reviewed and noted the following in the patient's chart:   . Medical and social history . Use of alcohol, tobacco or illicit drugs  . Current medications and supplements . Functional ability and status . Nutritional status . Physical activity . Advanced directives . List of other physicians . Vitals . Screenings to include cognitive, depression, and falls . Referrals and appointments  In addition, I have reviewed and discussed with patient certain preventive protocols, quality metrics, and best practice recommendations. A written personalized care plan for preventive services as well as general preventive health recommendations were provided to patient.     Dorrene German, RN  02/03/2017

## 2017-01-31 ENCOUNTER — Other Ambulatory Visit: Payer: Self-pay | Admitting: Family Medicine

## 2017-02-02 ENCOUNTER — Other Ambulatory Visit: Payer: Self-pay | Admitting: Family Medicine

## 2017-02-02 DIAGNOSIS — I10 Essential (primary) hypertension: Secondary | ICD-10-CM

## 2017-02-02 DIAGNOSIS — Z125 Encounter for screening for malignant neoplasm of prostate: Secondary | ICD-10-CM

## 2017-02-03 ENCOUNTER — Ambulatory Visit (INDEPENDENT_AMBULATORY_CARE_PROVIDER_SITE_OTHER): Payer: Medicare Other | Admitting: Family Medicine

## 2017-02-03 ENCOUNTER — Encounter: Payer: Self-pay | Admitting: Family Medicine

## 2017-02-03 ENCOUNTER — Ambulatory Visit (INDEPENDENT_AMBULATORY_CARE_PROVIDER_SITE_OTHER): Payer: Medicare Other

## 2017-02-03 ENCOUNTER — Other Ambulatory Visit (INDEPENDENT_AMBULATORY_CARE_PROVIDER_SITE_OTHER): Payer: Medicare Other

## 2017-02-03 VITALS — BP 138/102 | HR 72 | Temp 97.9°F | Resp 16 | Ht 68.0 in | Wt 185.0 lb

## 2017-02-03 DIAGNOSIS — T63481A Toxic effect of venom of other arthropod, accidental (unintentional), initial encounter: Secondary | ICD-10-CM

## 2017-02-03 DIAGNOSIS — Z125 Encounter for screening for malignant neoplasm of prostate: Secondary | ICD-10-CM

## 2017-02-03 DIAGNOSIS — Z0000 Encounter for general adult medical examination without abnormal findings: Secondary | ICD-10-CM

## 2017-02-03 DIAGNOSIS — I10 Essential (primary) hypertension: Secondary | ICD-10-CM

## 2017-02-03 DIAGNOSIS — Z7189 Other specified counseling: Secondary | ICD-10-CM

## 2017-02-03 LAB — PSA, MEDICARE: PSA: 2.7 ng/ml (ref 0.10–4.00)

## 2017-02-03 LAB — LIPID PANEL
CHOL/HDL RATIO: 2
Cholesterol: 184 mg/dL (ref 0–200)
HDL: 77.9 mg/dL (ref >39.00)
LDL CALC: 97 mg/dL (ref 0–99)
NonHDL: 106.19
TRIGLYCERIDES: 48 mg/dL (ref 0.0–149.0)
VLDL: 9.6 mg/dL (ref 0.0–40.0)

## 2017-02-03 LAB — COMPREHENSIVE METABOLIC PANEL
ALT: 19 U/L (ref 0–53)
AST: 24 U/L (ref 0–37)
Albumin: 4.2 g/dL (ref 3.5–5.2)
Alkaline Phosphatase: 79 U/L (ref 39–117)
BUN: 14 mg/dL (ref 6–23)
CALCIUM: 9.9 mg/dL (ref 8.4–10.5)
CHLORIDE: 103 meq/L (ref 96–112)
CO2: 29 meq/L (ref 19–32)
CREATININE: 1.08 mg/dL (ref 0.40–1.50)
GFR: 87.59 mL/min (ref >60.00)
GLUCOSE: 103 mg/dL — AB (ref 70–99)
Potassium: 4.7 mEq/L (ref 3.5–5.1)
SODIUM: 139 meq/L (ref 135–145)
Total Bilirubin: 0.4 mg/dL (ref 0.2–1.2)
Total Protein: 7 g/dL (ref 6.0–8.3)

## 2017-02-03 MED ORDER — LISINOPRIL 5 MG PO TABS: 7.5000 mg | ORAL_TABLET | Freq: Every day | ORAL | Status: DC

## 2017-02-03 NOTE — Progress Notes (Signed)
Presumed yellow jacket sting the L orbit, near the L brow.  No meds tried.  Vision is still wnl. Upper lid is puffy.  He used some ice.  Mildly itchy.    He is due for colonoscopy.  See AVS.  D/w pt.   Flu shot done at work.  Shingles d/w pt.  See AVS.  Advance directive- wife designated if patient were incapacitated.   PSA pending.   Diet and exercise d/w pt.  Biked 20 miles today.   Hypertension:    Using medication without problems or lightheadedness: yes Chest pain with exertion: no Edema:no Short of breath:no Average home BPs: still up to 140/90s at home.  On 5mg  lisinopril.   No SZ events, I'll defer to neuro clinic.   Meds, vitals, and allergies reviewed.   ROS: Per HPI unless specifically indicated in ROS section   GEN: nad, alert and oriented HEENT: mucous membranes moist, minimal swelling without tenderness or erythema near the left eyebrow, conjunctiva within normal limits, eye movements normal bilaterally NECK: supple w/o LA CV: rrr PULM: ctab, no inc wob ABD: soft, +bs EXT: no edema SKIN: no acute rash

## 2017-02-03 NOTE — Patient Instructions (Addendum)
Call GI about follow up.  [phone].   Check with your insurance to see if they will cover the shingrix shot. You can increase to 7.5 or 10mg  a day of lisinopril a day.  Update me as needed.   Take care.  Glad to see you.

## 2017-02-03 NOTE — Patient Instructions (Addendum)
Juan Wagner , Thank you for taking time to come for your Medicare Wellness Visit. I appreciate your ongoing commitment to your health goals. Please review the following plan we discussed and let me know if I can assist you in the future.   These are the goals we discussed: Goals    . Increase physical activity          Starting 02/03/2017, I will continue to do a variety of different types of exercise daily.       This is a list of the screening recommended for you and due dates:  Health Maintenance  Topic Date Due  . Colon Cancer Screening  02/17/2014  . Flu Shot  02/19/2017  . Tetanus Vaccine  04/17/2026  .  Hepatitis C: One time screening is recommended by Center for Disease Control  (CDC) for  adults born from 18 through 1965.   Completed    Preventive Care for Adults  A healthy lifestyle and preventive care can promote health and wellness. Preventive health guidelines for adults include the following key practices.  . A routine yearly physical is a good way to check with your health care provider about your health and preventive screening. It is a chance to share any concerns and updates on your health and to receive a thorough exam.  . Visit your dentist for a routine exam and preventive care every 6 months. Brush your teeth twice a day and floss once a day. Good oral hygiene prevents tooth decay and gum disease.  . The frequency of eye exams is based on your age, health, family medical history, use  of contact lenses, and other factors. Follow your health care provider's ecommendations for frequency of eye exams.  . Eat a healthy diet. Foods like vegetables, fruits, whole grains, low-fat dairy products, and lean protein foods contain the nutrients you need without too many calories. Decrease your intake of foods high in solid fats, added sugars, and salt. Eat the right amount of calories for you. Get information about a proper diet from your health care provider, if  necessary.  . Regular physical exercise is one of the most important things you can do for your health. Most adults should get at least 150 minutes of moderate-intensity exercise (any activity that increases your heart rate and causes you to sweat) each week. In addition, most adults need muscle-strengthening exercises on 2 or more days a week.  Silver Sneakers may be a benefit available to you. To determine eligibility, you may visit the website: www.silversneakers.com or contact program at 669-673-9373 Mon-Fri between 8AM-8PM.   . Maintain a healthy weight. The body mass index (BMI) is a screening tool to identify possible weight problems. It provides an estimate of body fat based on height and weight. Your health care provider can find your BMI and can help you achieve or maintain a healthy weight.   For adults 20 years and older: ? A BMI below 18.5 is considered underweight. ? A BMI of 18.5 to 24.9 is normal. ? A BMI of 25 to 29.9 is considered overweight. ? A BMI of 30 and above is considered obese.   . Maintain normal blood lipids and cholesterol levels by exercising and minimizing your intake of saturated fat. Eat a balanced diet with plenty of fruit and vegetables. Blood tests for lipids and cholesterol should begin at age 85 and be repeated every 5 years. If your lipid or cholesterol levels are high, you are over 50, or you  are at high risk for heart disease, you may need your cholesterol levels checked more frequently. Ongoing high lipid and cholesterol levels should be treated with medicines if diet and exercise are not working.  . If you smoke, find out from your health care provider how to quit. If you do not use tobacco, please do not start.  . If you choose to drink alcohol, please do not consume more than 2 drinks per day. One drink is considered to be 12 ounces (355 mL) of beer, 5 ounces (148 mL) of wine, or 1.5 ounces (44 mL) of liquor.  . If you are 66-51 years old, ask your  health care provider if you should take aspirin to prevent strokes.  . Use sunscreen. Apply sunscreen liberally and repeatedly throughout the day. You should seek shade when your shadow is shorter than you. Protect yourself by wearing long sleeves, pants, a wide-brimmed hat, and sunglasses year round, whenever you are outdoors.  . Once a month, do a whole body skin exam, using a mirror to look at the skin on your back. Tell your health care provider of new moles, moles that have irregular borders, moles that are larger than a pencil eraser, or moles that have changed in shape or color.

## 2017-02-04 ENCOUNTER — Telehealth: Payer: Self-pay | Admitting: Family Medicine

## 2017-02-04 DIAGNOSIS — T63481A Toxic effect of venom of other arthropod, accidental (unintentional), initial encounter: Secondary | ICD-10-CM | POA: Insufficient documentation

## 2017-02-04 DIAGNOSIS — Z0000 Encounter for general adult medical examination without abnormal findings: Secondary | ICD-10-CM | POA: Insufficient documentation

## 2017-02-04 NOTE — Assessment & Plan Note (Signed)
Advance directive- wife designated if patient were incapacitated.  

## 2017-02-04 NOTE — Assessment & Plan Note (Signed)
He is due for colonoscopy.  See AVS.  D/w pt.   Flu shot done at work.  Shingles d/w pt.  See AVS.  Advance directive- wife designated if patient were incapacitated.   PSA pending.   Diet and exercise d/w pt.  Biked 20 miles today.

## 2017-02-04 NOTE — Assessment & Plan Note (Signed)
Local reaction only. No systemic symptoms. Can use topical hydrocortisone as needed. Should resolve. Does not appear infected.

## 2017-02-04 NOTE — Telephone Encounter (Signed)
Pt returned your call about labs?. Please call cell number [phone] Thanks

## 2017-02-04 NOTE — Assessment & Plan Note (Signed)
Reasonable to increase lisinopril to 7.5 mg a day, then 10 mg a day if needed. See notes on labs. Update me as needed. He can recheck his blood pressure out of the clinic. Continue diet and exercise. He does well with both.

## 2017-03-07 ENCOUNTER — Encounter: Payer: Self-pay | Admitting: Gastroenterology

## 2017-04-07 ENCOUNTER — Encounter: Payer: Self-pay | Admitting: Family Medicine

## 2017-04-07 ENCOUNTER — Ambulatory Visit (INDEPENDENT_AMBULATORY_CARE_PROVIDER_SITE_OTHER): Payer: Medicare Other | Admitting: Family Medicine

## 2017-04-07 DIAGNOSIS — R05 Cough: Secondary | ICD-10-CM | POA: Diagnosis not present

## 2017-04-07 DIAGNOSIS — R059 Cough, unspecified: Secondary | ICD-10-CM

## 2017-04-07 NOTE — Patient Instructions (Signed)
Presumed cough from lisinopril.  Stop that for now.  Update me if the cough isn't getting better in about 1-2 weeks or if getting worse.  If your BP is consistently up, then let me know and we can set up another med.  Take care.  Glad to see you.

## 2017-04-07 NOTE — Progress Notes (Signed)
Cough.  Started a few weeks ago.  Minimal sputum but mostly dry.  No FCNAVD.  He doesn't feel sick.  On ACE, compliant.    No rhinorrhea, no ear pain.  No wheeze.  Still able to bike and elliptical at baseline, with consideration physical exertion.    No typical allergy hx or sx.  Throat clearing kind of cough.    No h/o asthma, COPD.  No heartburn.    Meds, vitals, and allergies reviewed.   ROS: Per HPI unless specifically indicated in ROS section   GEN: nad, alert and oriented, well appearing.  HEENT: mucous membranes moist, OP wnl NECK: supple w/o LA CV: rrr.   PULM: ctab, no inc wob ABD: soft, +bs EXT: no edema

## 2017-04-08 NOTE — Assessment & Plan Note (Signed)
Presumed cough from lisinopril.  Stop ACE for now.  He'll update me if the cough isn't getting better in about 1-2 weeks or if getting worse.  If BP is consistently up, then he'll let me know and we can change to ARB if needed.  D/w pt about ACE cough and ddx.

## 2017-04-15 ENCOUNTER — Encounter: Payer: Self-pay | Admitting: Gastroenterology

## 2017-04-15 ENCOUNTER — Ambulatory Visit (AMBULATORY_SURGERY_CENTER): Payer: Self-pay | Admitting: *Deleted

## 2017-04-15 VITALS — Ht 69.5 in | Wt 185.0 lb

## 2017-04-15 DIAGNOSIS — Z86010 Personal history of colonic polyps: Secondary | ICD-10-CM

## 2017-04-15 MED ORDER — NA SULFATE-K SULFATE-MG SULF 17.5-3.13-1.6 GM/177ML PO SOLN: 1.0000 | Freq: Once | ORAL | 0 refills | Status: AC

## 2017-04-15 NOTE — Progress Notes (Signed)
No egg or soy allergy known to patient  No issues with past sedation with any surgeries  or procedures, no intubation problems  No diet pills per patient No home 02 use per patient  No blood thinners per patient  Pt denies issues with constipation  No A fib or A flutter  EMMI video sent to pt's e mail pt declined   

## 2017-04-29 ENCOUNTER — Encounter: Payer: Self-pay | Admitting: Gastroenterology

## 2017-04-29 ENCOUNTER — Ambulatory Visit (AMBULATORY_SURGERY_CENTER): Payer: Medicare Other | Admitting: Gastroenterology

## 2017-04-29 VITALS — BP 125/89 | HR 52 | Temp 97.8°F | Resp 19 | Ht 69.5 in | Wt 185.0 lb

## 2017-04-29 DIAGNOSIS — Z86010 Personal history of colonic polyps: Secondary | ICD-10-CM | POA: Diagnosis present

## 2017-04-29 DIAGNOSIS — D124 Benign neoplasm of descending colon: Secondary | ICD-10-CM

## 2017-04-29 DIAGNOSIS — D123 Benign neoplasm of transverse colon: Secondary | ICD-10-CM

## 2017-04-29 MED ORDER — SODIUM CHLORIDE 0.9 % IV SOLN: 500.0000 mL | INTRAVENOUS | Status: DC

## 2017-04-29 NOTE — Progress Notes (Signed)
Report to PACU, RN, vss, BBS= Clear.  

## 2017-04-29 NOTE — Patient Instructions (Signed)
YOU HAD AN ENDOSCOPIC PROCEDURE TODAY AT THE [facility]:   Refer to the procedure report that was given to you for any specific questions about what was found during the examination.  If the procedure report does not answer your questions, please call your gastroenterologist to clarify.  If you requested that your care partner not be given the details of your procedure findings, then the procedure report has been included in a sealed envelope for you to review at your convenience later.  YOU SHOULD EXPECT: Some feelings of bloating in the abdomen. Passage of more gas than usual.  Walking can help get rid of the air that was put into your GI tract during the procedure and reduce the bloating. If you had a lower endoscopy (such as a colonoscopy or flexible sigmoidoscopy) you may notice spotting of blood in your stool or on the toilet paper. If you underwent a bowel prep for your procedure, you may not have a normal bowel movement for a few days.  Please Note:  You might notice some irritation and congestion in your nose or some drainage.  This is from the oxygen used during your procedure.  There is no need for concern and it should clear up in a day or so.  SYMPTOMS TO REPORT IMMEDIATELY:   Following lower endoscopy (colonoscopy or flexible sigmoidoscopy):  Excessive amounts of blood in the stool  Significant tenderness or worsening of abdominal pains  Swelling of the abdomen that is new, acute  Fever of 100F or higher    For urgent or emergent issues, a gastroenterologist can be reached at any hour by calling [phone].   DIET:  We do recommend a small meal at first, but then you may proceed to your regular diet.  Drink plenty of fluids but you should avoid alcoholic beverages for 24 hours.  ACTIVITY:  You should plan to take it easy for the rest of today and you should NOT DRIVE or use heavy machinery until tomorrow (because of the sedation medicines used during the test).     FOLLOW UP: Our staff will call the number listed on your records the next business day following your procedure to check on you and address any questions or concerns that you may have regarding the information given to you following your procedure. If we do not reach you, we will leave a message.  However, if you are feeling well and you are not experiencing any problems, there is no need to return our call.  We will assume that you have returned to your regular daily activities without incident.  If any biopsies were taken you will be contacted by phone or by letter within the next 1-3 weeks.  Please call us at [phone] if you have not heard about the biopsies in 3 weeks.    SIGNATURES/CONFIDENTIALITY: You and/or your care partner have signed paperwork which will be entered into your electronic medical record.  These signatures attest to the fact that that the information above on your After Visit Summary has been reviewed and is understood.  Full responsibility of the confidentiality of this discharge information lies with you and/or your care-partner.   Resume medications. Information given on polyps. 

## 2017-04-29 NOTE — Progress Notes (Signed)
Called to room to assist during endoscopic procedure.  Patient ID and intended procedure confirmed with present staff. Received instructions for my participation in the procedure from the performing physician.  

## 2017-04-29 NOTE — Op Note (Signed)
[place] Patient Name: Juan Wagner Procedure Date: 04/29/2017 1:17 PM MRN: [id number] Endoscopist: Ladene Artist , MD Age: 66 Referring MD:  Date of Birth: 24-Nov-1950 Gender: Male Account #: [id number] Procedure:                Colonoscopy Indications:              Surveillance: Personal history of adenomatous                            polyps on last colonoscopy > 5 years ago Medicines:                Monitored Anesthesia Care Procedure:                Pre-Anesthesia Assessment:                           - Prior to the procedure, a History and Physical                            was performed, and patient medications and                            allergies were reviewed. The patient's tolerance of                            previous anesthesia was also reviewed. The risks                            and benefits of the procedure and the sedation                            options and risks were discussed with the patient.                            All questions were answered, and informed consent                            was obtained. Prior Anticoagulants: The patient has                            taken no previous anticoagulant or antiplatelet                            agents. ASA Grade Assessment: II - A patient with                            mild systemic disease. After reviewing the risks                            and benefits, the patient was deemed in                            satisfactory condition to undergo the procedure.  After obtaining informed consent, the colonoscope                            was passed under direct vision. Throughout the                            procedure, the patient's blood pressure, pulse, and                            oxygen saturations were monitored continuously. The                            Colonoscope was introduced through the anus and                            advanced to the the cecum,  identified by                            appendiceal orifice and ileocecal valve. The                            ileocecal valve, appendiceal orifice, and rectum                            were photographed. The quality of the bowel                            preparation was excellent. The colonoscopy was                            performed without difficulty. The patient tolerated                            the procedure well. Scope In: 1:40:15 PM Scope Out: 1:57:16 PM Scope Withdrawal Time: 0 hours 13 minutes 51 seconds  Total Procedure Duration: 0 hours 17 minutes 1 second  Findings:                 The perianal and digital rectal examinations were                            normal.                           Three sessile polyps were found in the descending                            colon and transverse colon. The polyps were 5 to 6                            mm in size. These polyps were removed with a cold                            snare. Resection and retrieval were complete.  The exam was otherwise without abnormality on                            direct and retroflexion views. Complications:            No immediate complications. Estimated blood loss:                            None. Estimated Blood Loss:     Estimated blood loss: none. Impression:               - Three 5 to 6 mm polyps in the descending colon                            and in the transverse colon, removed with a cold                            snare. Resected and retrieved.                           - The examination was otherwise normal on direct                            and retroflexion views. Recommendation:           - Repeat colonoscopy in 5 years for surveillance.                           - Patient has a contact number available for                            emergencies. The signs and symptoms of potential                            delayed complications were discussed with  the                            patient. Return to normal activities tomorrow.                            Written discharge instructions were provided to the                            patient.                           - Resume previous diet.                           - Continue present medications.                           - Await pathology results. Ladene Artist, MD 04/29/2017 2:03:08 PM This report has been signed electronically.

## 2017-04-30 ENCOUNTER — Telehealth: Payer: Self-pay

## 2017-04-30 NOTE — Telephone Encounter (Signed)
  Follow up Call-  Call Mersedes Alber number 04/29/2017  Post procedure Call Jontae Adebayo phone  # [phone]  Permission to leave phone message Yes  Some recent data might be hidden     Patient questions:  Do you have a fever, pain , or abdominal swelling? No. Pain Score  0 *  Have you tolerated food without any problems? Yes.    Have you been able to return to your normal activities? Yes.    Do you have any questions about your discharge instructions: Diet   No. Medications  No. Follow up visit  No.  Do you have questions or concerns about your Care? No.  Actions: * If pain score is 4 or above: No action needed, pain <4.

## 2017-05-04 ENCOUNTER — Encounter: Payer: Self-pay | Admitting: Gastroenterology

## 2017-12-08 ENCOUNTER — Telehealth: Payer: Self-pay | Admitting: Family Medicine

## 2017-12-08 NOTE — Telephone Encounter (Signed)
Copied from [place] [id number]. Topic: Quick Communication - Rx Refill/Question >> Dec 08, 2017  3:59 PM Percell Belt A wrote: Medication: amLODipine (NORVASC) 10 MG tablet [[id number]]  Has the patient contacted their pharmacy? No  (Agent: If no, request that the patient contact the pharmacy for the refill.) (Agent: If yes, when and what did the pharmacy advise?)  Preferred Pharmacy (with phone number or street name): [facility] [address]   Agent: Please be advised that RX refills may take up to 3 business days. We ask that you follow-up with your pharmacy.

## 2017-12-09 NOTE — Telephone Encounter (Signed)
Spoke with [facility] who states pt does have one refill available for Amlodipine. Prescription will be refilled for the pt today.

## 2018-02-09 ENCOUNTER — Other Ambulatory Visit: Payer: Self-pay | Admitting: Family Medicine

## 2018-02-09 DIAGNOSIS — Z125 Encounter for screening for malignant neoplasm of prostate: Secondary | ICD-10-CM

## 2018-02-09 DIAGNOSIS — I10 Essential (primary) hypertension: Secondary | ICD-10-CM

## 2018-02-10 ENCOUNTER — Encounter: Payer: Self-pay | Admitting: Family Medicine

## 2018-02-10 ENCOUNTER — Ambulatory Visit (INDEPENDENT_AMBULATORY_CARE_PROVIDER_SITE_OTHER): Payer: Medicare Other

## 2018-02-10 ENCOUNTER — Ambulatory Visit (INDEPENDENT_AMBULATORY_CARE_PROVIDER_SITE_OTHER): Payer: Medicare Other | Admitting: Family Medicine

## 2018-02-10 VITALS — BP 134/98 | HR 58 | Temp 97.7°F | Ht 69.5 in | Wt 181.8 lb

## 2018-02-10 DIAGNOSIS — Z125 Encounter for screening for malignant neoplasm of prostate: Secondary | ICD-10-CM | POA: Diagnosis not present

## 2018-02-10 DIAGNOSIS — Z0000 Encounter for general adult medical examination without abnormal findings: Secondary | ICD-10-CM

## 2018-02-10 DIAGNOSIS — R569 Unspecified convulsions: Secondary | ICD-10-CM

## 2018-02-10 DIAGNOSIS — I10 Essential (primary) hypertension: Secondary | ICD-10-CM

## 2018-02-10 DIAGNOSIS — G4730 Sleep apnea, unspecified: Secondary | ICD-10-CM

## 2018-02-10 LAB — COMPREHENSIVE METABOLIC PANEL
ALK PHOS: 88 U/L (ref 39–117)
ALT: 21 U/L (ref 0–53)
AST: 30 U/L (ref 0–37)
Albumin: 4 g/dL (ref 3.5–5.2)
BUN: 15 mg/dL (ref 6–23)
CHLORIDE: 103 meq/L (ref 96–112)
CO2: 31 meq/L (ref 19–32)
Calcium: 9.5 mg/dL (ref 8.4–10.5)
Creatinine, Ser: 1.29 mg/dL (ref 0.40–1.50)
GFR: 71.13 mL/min (ref >60.00)
GLUCOSE: 96 mg/dL (ref 70–99)
POTASSIUM: 4.5 meq/L (ref 3.5–5.1)
Sodium: 139 mEq/L (ref 135–145)
TOTAL PROTEIN: 7.1 g/dL (ref 6.0–8.3)
Total Bilirubin: 0.4 mg/dL (ref 0.2–1.2)

## 2018-02-10 LAB — LIPID PANEL
CHOL/HDL RATIO: 3
Cholesterol: 195 mg/dL (ref 0–200)
HDL: 77.2 mg/dL (ref >39.00)
LDL Cholesterol: 109 mg/dL — ABNORMAL HIGH (ref 0–99)
NONHDL: 117.68
Triglycerides: 42 mg/dL (ref 0.0–149.0)
VLDL: 8.4 mg/dL (ref 0.0–40.0)

## 2018-02-10 LAB — PSA, MEDICARE: PSA: 1.97 ng/ml (ref 0.10–4.00)

## 2018-02-10 MED ORDER — AMLODIPINE BESYLATE 10 MG PO TABS: 10.0000 mg | ORAL_TABLET | Freq: Every day | ORAL | 3 refills | Status: DC

## 2018-02-10 MED ORDER — HYDROCHLOROTHIAZIDE 25 MG PO TABS: 12.5000 mg | ORAL_TABLET | Freq: Every day | ORAL | Status: DC

## 2018-02-10 NOTE — Progress Notes (Signed)
Subjective:   Juan Wagner is a 67 y.o. male who presents for Medicare Annual/Subsequent preventive examination.  Review of Systems:  N/A Cardiac Risk Factors include: advanced age (>36men, >6 women);hypertension;male gender     Objective:    Vitals: BP (!) 134/98 (BP Location: Left Arm, Patient Position: Sitting, Cuff Size: Normal)   Pulse (!) 58   Temp 97.7 F (36.5 C) (Oral)   Ht 5' 9.5" (1.765 m) Comment: no shoes  Wt 181 lb 12 oz (82.4 kg)   SpO2 98%   BMI 26.45 kg/m   Body mass index is 26.45 kg/m.  Advanced Directives 02/10/2018 04/15/2017 02/03/2017  Does Patient Have a Medical Advance Directive? Yes Yes Yes  Type of Paramedic of [place];Living will Living will;Healthcare Power of [place];Living will  Copy of [place] in Chart? No - copy requested - No - copy requested    Tobacco Social History   Tobacco Use  Smoking Status Never Smoker  Smokeless Tobacco Never Used     Counseling given: No   Clinical Intake:  Pre-visit preparation completed: Yes  Pain : No/denies pain Pain Score: 0-No pain     Nutritional Status: BMI 25 -29 Overweight Nutritional Risks: None Diabetes: No  How often do you need to have someone help you when you read instructions, pamphlets, or other written materials from your doctor or pharmacy?: 1 - Never What is the last grade level you completed in school?: Associates degree  Interpreter Needed?: No  Comments: pt lives with spouse Information entered by :: LPinson, LPN  Past Medical History:  Diagnosis Date  . ED (erectile dysfunction)   . Hyperlipidemia    2018 was 184- past hx   . Hypertension   . Seizures ([place])    1st event 03/2011- no seizure in years as of 2019  . Sleep apnea    no CPAP use as of 2019   Past Surgical History:  Procedure Laterality Date  . COLONOSCOPY    . KNEE SURGERY     scope L knee  . POLYPECTOMY    .  SEPTOPLASTY     broken nose   Family History  Problem Relation Age of Onset  . Hypertension Mother   . Cancer Mother        ovarian CA  chemo 2010 remission  . Breast cancer Mother   . Ovarian cancer Mother   . Alcohol abuse Father   . Cancer Father   . Throat cancer Father   . Esophageal cancer Father   . Cancer Sister        pancreatic CA Stage IV,mets to lungs  . Pancreatic cancer Sister   . Cirrhosis Brother        smoke and drugs  . Cancer Brother        lung cancer  . Lung cancer Brother   . Hypertension Maternal Grandmother   . Hypertension Maternal Grandfather   . Prostate cancer Neg Hx   . Colon cancer Neg Hx   . Colon polyps Neg Hx   . Rectal cancer Neg Hx   . Stomach cancer Neg Hx    Social History   Socioeconomic History  . Marital status: Married    Spouse name: Not on file  . Number of children: Not on file  . Years of education: Not on file  . Highest education level: Not on file  Occupational History  . Not on file  Social  Needs  . Financial resource strain: Not on file  . Food insecurity:    Worry: Not on file    Inability: Not on file  . Transportation needs:    Medical: Not on file    Non-medical: Not on file  Tobacco Use  . Smoking status: Never Smoker  . Smokeless tobacco: Never Used  Substance and Sexual Activity  . Alcohol use: No    Alcohol/week: 0.0 oz  . Drug use: No  . Sexual activity: Yes  Lifestyle  . Physical activity:    Days per week: Not on file    Minutes per session: Not on file  . Stress: Not on file  Relationships  . Social connections:    Talks on phone: Not on file    Gets together: Not on file    Attends religious service: Not on file    Active member of club or organization: Not on file    Attends meetings of clubs or organizations: Not on file    Relationship status: Not on file  Other Topics Concern  . Not on file  Social History Narrative   Married ~40 years   2 kids   Retired from [employer] in  [place], works with recruits as of 2019    Outpatient Encounter Medications as of 02/10/2018  Medication Sig  . aspirin 81 MG tablet Take 81 mg by mouth daily.    . Multiple Vitamin (MULTIVITAMIN) tablet Take 1 tablet by mouth daily.    Marland Kitchen oxcarbazepine (TRILEPTAL) 600 MG tablet Take 600 mg by mouth 2 (two) times daily.  . [DISCONTINUED] amLODipine (NORVASC) 10 MG tablet TAKE ONE TABLET BY MOUTH ONCE DAILY  . [DISCONTINUED] hydrochlorothiazide (HYDRODIURIL) 25 MG tablet Take 25 mg by mouth daily.   No facility-administered encounter medications on file as of 02/10/2018.     Activities of Daily Living In your present state of health, do you have any difficulty performing the following activities: 02/10/2018  Hearing? N  Vision? N  Difficulty concentrating or making decisions? N  Walking or climbing stairs? N  Dressing or bathing? N  Doing errands, shopping? N  Preparing Food and eating ? N  Using the Toilet? N  In the past six months, have you accidently leaked urine? N  Do you have problems with loss of bowel control? N  Managing your Medications? N  Managing your Finances? N  Housekeeping or managing your Housekeeping? N  Some recent data might be hidden    Patient Care Team: Tonia Ghent, MD as PCP - General (Family Medicine) Anabel Bene, MD as Referring Physician (Neurology)   Assessment:   This is a routine wellness examination for [place].   Hearing Screening   125Hz  250Hz  500Hz  1000Hz  2000Hz  3000Hz  4000Hz  6000Hz  8000Hz   Right ear:   40 40 40  40    Left ear:   40 40 40  40    Vision Screening Comments: Vision exam in May 2019 @ [facility]   Exercise Activities and Dietary recommendations Current Exercise Habits: Home exercise routine, Type of exercise: strength training/weights;stretching;Other - see comments(running, jogging, aerobics), Time (Minutes): 60, Frequency (Times/Week): 6, Weekly Exercise (Minutes/Week): 360, Exercise limited by: None  identified  Goals    . Increase physical activity     Starting 02/10/2018, I will continue to exercise for at least 60 minutes 6 days per week.        Fall Risk Fall Risk  02/10/2018 02/03/2017 01/30/2016 12/08/2014  Falls in the  past year? No No No No   Depression Screen PHQ 2/9 Scores 02/10/2018 02/03/2017 01/30/2016 12/08/2014  PHQ - 2 Score 0 0 0 0  PHQ- 9 Score 0 - - -    Cognitive Function MMSE - Mini Mental State Exam 02/10/2018 02/03/2017  Orientation to time 5 5  Orientation to Place 5 5  Registration 3 3  Attention/ Calculation 0 0  Recall 3 3  Language- name 2 objects 0 0  Language- repeat 1 1  Language- follow 3 step command 3 3  Language- read & follow direction 0 0  Write a sentence 0 0  Copy design 0 0  Total score 20 20     PLEASE NOTE: A Mini-Cog screen was completed. Maximum score is 20. A value of 0 denotes this part of [place] MMSE was not completed or the patient failed this part of the Mini-Cog screening.   Mini-Cog Screening Orientation to Time - Max 5 pts Orientation to Place - Max 5 pts Registration - Max 3 pts Recall - Max 3 pts Language Repeat - Max 1 pts Language Follow 3 Step Command - Max 3 pts     Immunization History  Administered Date(s) Administered  . Pneumococcal Conjugate-13 12/08/2014  . Td 12/21/2008, 04/17/2016    Screening Tests Health Maintenance  Topic Date Due  . INFLUENZA VACCINE  02/19/2018  . COLONOSCOPY  04/29/2022  . TETANUS/TDAP  04/17/2026  . Hepatitis C Screening  Completed       Plan:     I have personally reviewed, addressed, and noted the following in the patient's chart:  A. Medical and social history B. Use of alcohol, tobacco or illicit drugs  C. Current medications and supplements D. Functional ability and status E.  Nutritional status F.  Physical activity G. Advance directives H. List of other physicians I.  Hospitalizations, surgeries, and ER visits in previous 12 months J.   [place] to include hearing, vision, cognitive, depression L. Referrals and appointments - none  In addition, I have reviewed and discussed with patient certain preventive protocols, quality metrics, and best practice recommendations. A written personalized care plan for preventive services as well as general preventive health recommendations were provided to patient.  See attached scanned questionnaire for additional information.   Signed,   Lindell Noe, MHA, BS, LPN Health Coach

## 2018-02-10 NOTE — Patient Instructions (Signed)
Mr. Shimabukuro , Thank you for taking time to come for your Medicare Wellness Visit. I appreciate your ongoing commitment to your health goals. Please review the following plan we discussed and let me know if I can assist you in the future.   These are the goals we discussed: Goals    . Increase physical activity     Starting 02/10/2018, I will continue to exercise for at least 60 minutes 6 days per week.        This is a list of the screening recommended for you and due dates:  Health Maintenance  Topic Date Due  . Flu Shot  02/19/2018  . Colon Cancer Screening  04/29/2022  . Tetanus Vaccine  04/17/2026  .  Hepatitis C: One time screening is recommended by Center for Disease Control  (CDC) for  adults born from [birth year] through 1966.   Completed   Preventive Care for Adults  A healthy lifestyle and preventive care can promote health and wellness. Preventive health guidelines for adults include the following key practices.  . A routine yearly physical is a good way to check with your health care provider about your health and preventive screening. It is a chance to share any concerns and updates on your health and to receive a thorough exam.  . Visit your dentist for a routine exam and preventive care every 6 months. Brush your teeth twice a day and floss once a day. Good oral hygiene prevents tooth decay and gum disease.  . The frequency of eye exams is based on your age, health, family medical history, use  of contact lenses, and other factors. Follow your health care provider's recommendations for frequency of eye exams.  . Eat a healthy diet. Foods like vegetables, fruits, whole grains, low-fat dairy products, and lean protein foods contain the nutrients you need without too many calories. Decrease your intake of foods high in solid fats, added sugars, and salt. Eat the right amount of calories for you. Get information about a proper diet from your health care provider, if necessary.  .  Regular physical exercise is one of the most important things you can do for your health. Most adults should get at least 150 minutes of moderate-intensity exercise (any activity that increases your heart rate and causes you to sweat) each week. In addition, most adults need muscle-strengthening exercises on 2 or more days a week.  Silver Sneakers may be a benefit available to you. To determine eligibility, you may visit the website: www.silversneakers.com or contact program at (970) 662-8440 Mon-Fri between 8AM-8PM.   . Maintain a healthy weight. The body mass index (BMI) is a screening tool to identify possible weight problems. It provides an estimate of body fat based on height and weight. Your health care provider can find your BMI and can help you achieve or maintain a healthy weight.   For adults 20 years and older: ? A BMI below 18.5 is considered underweight. ? A BMI of 18.5 to 24.9 is normal. ? A BMI of 25 to 29.9 is considered overweight. ? A BMI of 30 and above is considered obese.   . Maintain normal blood lipids and cholesterol levels by exercising and minimizing your intake of saturated fat. Eat a balanced diet with plenty of fruit and vegetables. Blood tests for lipids and cholesterol should begin at age 66 and be repeated every 5 years. If your lipid or cholesterol levels are high, you are over 50, or you are at high risk  for heart disease, you may need your cholesterol levels checked more frequently. Ongoing high lipid and cholesterol levels should be treated with medicines if diet and exercise are not working.  . If you smoke, find out from your health care provider how to quit. If you do not use tobacco, please do not start.  . If you choose to drink alcohol, please do not consume more than 2 drinks per day. One drink is considered to be 12 ounces (355 mL) of beer, 5 ounces (148 mL) of wine, or 1.5 ounces (44 mL) of liquor.  . If you are 89-3 years old, ask your health care  provider if you should take aspirin to prevent strokes.  . Use sunscreen. Apply sunscreen liberally and repeatedly throughout the day. You should seek shade when your shadow is shorter than you. Protect yourself by wearing long sleeves, pants, a wide-brimmed hat, and sunglasses year round, whenever you are outdoors.  . Once a month, do a whole body skin exam, using a mirror to look at the skin on your back. Tell your health care provider of new moles, moles that have irregular borders, moles that are larger than a pencil eraser, or moles that have changed in shape or color.

## 2018-02-10 NOTE — Progress Notes (Signed)
PCP notes:   Health maintenance:  No gaps identified.  Abnormal screenings:   None  Patient concerns:   Patient wants to discuss BP and new medication prescribed by neurologist.  Nurse concerns:  None  Next PCP appt:   02/10/18 @ 0945  I reviewed health advisor's note, was available for consultation on the day of service listed in this note, and agree with documentation and plan. Elsie Stain, MD.

## 2018-02-10 NOTE — Patient Instructions (Signed)
We'll contact you with your lab report. Try adding on HCTZ 12.5mg  a day.  Careful not to get dehydrated.  If needed, increase to 25mg .   Goal BP <130/<90.   Update me as needed.  Take care.  Glad to see you.  Thank you for your effort.

## 2018-02-10 NOTE — Progress Notes (Signed)
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  He is still doing silver sneakers, biking, etc.  D/w pt.  He is still running some, using a knee sleeve if needed.    Colonoscopy 2018 Vaccines d/w pt.  Flu shot can be done at work.  Shingles d/w pt.  See AVS.  Advance directive- wife designated if patient were incapacitated.  PSA pending.   Diet and exercise d/w pt, doing well on both.   Colonoscopy 2018  Mild snoring w/o CPAP use currently.  If sx worse then he'll update me.  D/w pt.  Hypertension:    Using medication without problems or lightheadedness: yes Chest pain with exertion:no Edema:no Short of breath:no He was asking about adding on hydrochlorothiazide use given his mild elevation in blood pressure.  Discussed.  Routine medications cautions given, especially regarding possible dehydration with his level of exercise and concurrent hydrochlorothiazide use.   H/o seizure, in distant past, per neuro.  No sx in years.  Per neuro.  Compliant with med. No ADE on med.    Meds, vitals, and allergies reviewed.   PMH and SH reviewed  ROS: Per HPI unless specifically indicated in ROS section   GEN: nad, alert and oriented HEENT: mucous membranes moist NECK: supple w/o LA CV: rrr. PULM: ctab, no inc wob ABD: soft, +bs EXT: no edema SKIN: no acute rash

## 2018-02-12 NOTE — Assessment & Plan Note (Signed)
Mild snoring w/o CPAP use currently.  If sx worse then he'll update me.

## 2018-02-12 NOTE — Assessment & Plan Note (Signed)
No symptoms now.  Compliant with medication.  Per neurology.  I will defer.

## 2018-02-12 NOTE — Assessment & Plan Note (Signed)
He was asking about adding on hydrochlorothiazide use given his mild elevation in blood pressure.  Discussed.  Routine medications cautions given, especially regarding possible dehydration with his level of exercise and concurrent hydrochlorothiazide use.  Reasonable to add on 12.5 mg of hydrochlorothiazide and update me as needed.  See after visit summary.  Labs pending.

## 2018-02-12 NOTE — Assessment & Plan Note (Signed)
Colonoscopy 2018 Vaccines d/w pt.  Flu shot can be done at work.  Shingles d/w pt.  See AVS.  Advance directive- wife designated if patient were incapacitated.  PSA pending.   Diet and exercise d/w pt, doing well on both.   Colonoscopy 2018

## 2018-02-17 ENCOUNTER — Other Ambulatory Visit: Payer: Self-pay | Admitting: Family Medicine

## 2018-02-17 DIAGNOSIS — E785 Hyperlipidemia, unspecified: Secondary | ICD-10-CM

## 2018-02-17 MED ORDER — ATORVASTATIN CALCIUM 20 MG PO TABS: 20.0000 mg | ORAL_TABLET | Freq: Every day | ORAL | 3 refills | Status: DC

## 2018-04-16 ENCOUNTER — Encounter (INDEPENDENT_AMBULATORY_CARE_PROVIDER_SITE_OTHER): Payer: Self-pay

## 2018-04-16 ENCOUNTER — Other Ambulatory Visit (INDEPENDENT_AMBULATORY_CARE_PROVIDER_SITE_OTHER): Payer: Medicare Other

## 2018-04-16 DIAGNOSIS — E785 Hyperlipidemia, unspecified: Secondary | ICD-10-CM

## 2018-04-16 LAB — HEPATIC FUNCTION PANEL
ALK PHOS: 91 U/L (ref 39–117)
ALT: 35 U/L (ref 0–53)
AST: 47 U/L — AB (ref 0–37)
Albumin: 4.3 g/dL (ref 3.5–5.2)
BILIRUBIN TOTAL: 0.5 mg/dL (ref 0.2–1.2)
Bilirubin, Direct: 0.1 mg/dL (ref 0.0–0.3)
Total Protein: 7.7 g/dL (ref 6.0–8.3)

## 2018-04-16 LAB — LIPID PANEL
Cholesterol: 155 mg/dL (ref 0–200)
HDL: 84.8 mg/dL (ref >39.00)
LDL CALC: 62 mg/dL (ref 0–99)
NONHDL: 69.75
Total CHOL/HDL Ratio: 2
Triglycerides: 41 mg/dL (ref 0.0–149.0)
VLDL: 8.2 mg/dL (ref 0.0–40.0)

## 2018-04-19 ENCOUNTER — Other Ambulatory Visit: Payer: Self-pay | Admitting: Family Medicine

## 2018-04-19 DIAGNOSIS — E785 Hyperlipidemia, unspecified: Secondary | ICD-10-CM

## 2018-04-19 MED ORDER — ATORVASTATIN CALCIUM 20 MG PO TABS: 10.0000 mg | ORAL_TABLET | Freq: Every day | ORAL | Status: DC

## 2018-04-21 ENCOUNTER — Telehealth: Payer: Self-pay | Admitting: Family Medicine

## 2018-04-21 NOTE — Telephone Encounter (Signed)
Pt called in and was given lab result message from Dr. Damita Dunnings dated 04/19/18 at 10:32PM.  He verbalized understanding of cutting his Lipitor in half.  I scheduled him a lab visit for 06/22/18 at 10:30am.  No questions from pt.

## 2018-06-22 ENCOUNTER — Other Ambulatory Visit (INDEPENDENT_AMBULATORY_CARE_PROVIDER_SITE_OTHER): Payer: Medicare Other

## 2018-06-22 DIAGNOSIS — E785 Hyperlipidemia, unspecified: Secondary | ICD-10-CM

## 2018-06-22 LAB — LIPID PANEL
CHOL/HDL RATIO: 2
CHOLESTEROL: 163 mg/dL (ref 0–200)
HDL: 79.3 mg/dL (ref >39.00)
LDL CALC: 72 mg/dL (ref 0–99)
NonHDL: 83.2
TRIGLYCERIDES: 56 mg/dL (ref 0.0–149.0)
VLDL: 11.2 mg/dL (ref 0.0–40.0)

## 2018-06-22 LAB — HEPATIC FUNCTION PANEL
ALT: 24 U/L (ref 0–53)
AST: 26 U/L (ref 0–37)
Albumin: 4.2 g/dL (ref 3.5–5.2)
Alkaline Phosphatase: 93 U/L (ref 39–117)
BILIRUBIN DIRECT: 0.1 mg/dL (ref 0.0–0.3)
BILIRUBIN TOTAL: 0.4 mg/dL (ref 0.2–1.2)
Total Protein: 7.7 g/dL (ref 6.0–8.3)

## 2018-08-10 ENCOUNTER — Ambulatory Visit: Payer: Medicare Other | Admitting: Family Medicine

## 2018-08-10 ENCOUNTER — Encounter: Payer: Self-pay | Admitting: Family Medicine

## 2018-08-10 DIAGNOSIS — S80812A Abrasion, left lower leg, initial encounter: Secondary | ICD-10-CM

## 2018-08-10 MED ORDER — HYDROCHLOROTHIAZIDE 25 MG PO TABS: 12.5000 mg | ORAL_TABLET | Freq: Every day | ORAL | Status: DC

## 2018-08-10 NOTE — Patient Instructions (Signed)
I think this is all resolving swelling and bruising.  It should get better in the next 10-14 days.   Elevation may help.  If more troubles, then let me know.   Take care.  Glad to see you.

## 2018-08-10 NOTE — Progress Notes (Signed)
He is tolerating statin, d/w pt.    L shin injury.  Scraped L midshin on a tree that he was cutting down.  About 10 days ago.  Bled a little, then cleaned it right thereafter. Used merthiolate topically and neosporin.   Some local swelling mid shin and L ankle.  Still running at baseline, exercising.  No ankle pain.  No R ankle pain or swelling.    Mildly ttp locally, mid shin. No fevers.  No drainage.  No red streaks.  No fevers.  He has some bruising on the L medial ankle.  The tree didn't break the pants, no FB.     Meds, vitals, and allergies reviewed.   ROS: Per HPI unless specifically indicated in ROS section   nad ncat L calf 40cm, R calf 39cm.  Neither calf tender to palpation. Left mid shin with narrow scabbed longitudinal/vertical area.  No spreading erythema.  No drainage. L ankle with medial and lateral bruising but medial lateral malleolus nontender.

## 2018-08-11 DIAGNOSIS — S80819A Abrasion, unspecified lower leg, initial encounter: Secondary | ICD-10-CM | POA: Insufficient documentation

## 2018-08-11 NOTE — Assessment & Plan Note (Signed)
This looks like a benign abrasion.  It does not look infected.  I think it is going to be slower to heal given the location and the size.  I think he likely had some local bruising and expected swelling.  Some of this has caused dependent swelling with bruising noted near the left ankle.  It does not look like he has an ankle issue otherwise.  He still been able to run in the meantime.  I expect him to do well.  Local care only.  Update me as needed.  He agrees.

## 2019-02-08 ENCOUNTER — Telehealth: Payer: Self-pay | Admitting: Family Medicine

## 2019-02-08 DIAGNOSIS — Z125 Encounter for screening for malignant neoplasm of prostate: Secondary | ICD-10-CM

## 2019-02-08 DIAGNOSIS — I10 Essential (primary) hypertension: Secondary | ICD-10-CM

## 2019-02-08 NOTE — Telephone Encounter (Signed)
Pt needs lab orders put in for Thursday 02/11/19 for AMV. He will see you on Friday.

## 2019-02-10 NOTE — Telephone Encounter (Signed)
Done. Thanks.

## 2019-02-11 ENCOUNTER — Other Ambulatory Visit (INDEPENDENT_AMBULATORY_CARE_PROVIDER_SITE_OTHER): Payer: Medicare Other

## 2019-02-11 DIAGNOSIS — Z125 Encounter for screening for malignant neoplasm of prostate: Secondary | ICD-10-CM

## 2019-02-11 DIAGNOSIS — I10 Essential (primary) hypertension: Secondary | ICD-10-CM

## 2019-02-11 LAB — LIPID PANEL
Cholesterol: 199 mg/dL (ref 0–200)
HDL: 83.2 mg/dL (ref >39.00)
LDL Cholesterol: 102 mg/dL — ABNORMAL HIGH (ref 0–99)
NonHDL: 115.59
Total CHOL/HDL Ratio: 2
Triglycerides: 67 mg/dL (ref 0.0–149.0)
VLDL: 13.4 mg/dL (ref 0.0–40.0)

## 2019-02-11 LAB — COMPREHENSIVE METABOLIC PANEL
ALT: 18 U/L (ref 0–53)
AST: 24 U/L (ref 0–37)
Albumin: 4.3 g/dL (ref 3.5–5.2)
Alkaline Phosphatase: 88 U/L (ref 39–117)
BUN: 17 mg/dL (ref 6–23)
CO2: 31 mEq/L (ref 19–32)
Calcium: 10.1 mg/dL (ref 8.4–10.5)
Chloride: 102 mEq/L (ref 96–112)
Creatinine, Ser: 1.23 mg/dL (ref 0.40–1.50)
GFR: 70.5 mL/min (ref >60.00)
Glucose, Bld: 95 mg/dL (ref 70–99)
Potassium: 4.9 mEq/L (ref 3.5–5.1)
Sodium: 139 mEq/L (ref 135–145)
Total Bilirubin: 0.4 mg/dL (ref 0.2–1.2)
Total Protein: 7.2 g/dL (ref 6.0–8.3)

## 2019-02-11 LAB — PSA, MEDICARE: PSA: 1.55 ng/ml (ref 0.10–4.00)

## 2019-02-12 ENCOUNTER — Ambulatory Visit (INDEPENDENT_AMBULATORY_CARE_PROVIDER_SITE_OTHER): Payer: Medicare Other | Admitting: Family Medicine

## 2019-02-12 ENCOUNTER — Other Ambulatory Visit: Payer: Self-pay

## 2019-02-12 ENCOUNTER — Ambulatory Visit: Payer: Medicare Other

## 2019-02-12 ENCOUNTER — Encounter: Payer: Self-pay | Admitting: Family Medicine

## 2019-02-12 VITALS — BP 132/88 | HR 62 | Temp 98.5°F | Ht 69.5 in | Wt 182.2 lb

## 2019-02-12 DIAGNOSIS — Z0000 Encounter for general adult medical examination without abnormal findings: Secondary | ICD-10-CM

## 2019-02-12 DIAGNOSIS — Z7189 Other specified counseling: Secondary | ICD-10-CM

## 2019-02-12 DIAGNOSIS — E785 Hyperlipidemia, unspecified: Secondary | ICD-10-CM

## 2019-02-12 DIAGNOSIS — I10 Essential (primary) hypertension: Secondary | ICD-10-CM | POA: Diagnosis not present

## 2019-02-12 DIAGNOSIS — G40219 Localization-related (focal) (partial) symptomatic epilepsy and epileptic syndromes with complex partial seizures, intractable, without status epilepticus: Secondary | ICD-10-CM

## 2019-02-12 MED ORDER — ATORVASTATIN CALCIUM 10 MG PO TABS: 10.0000 mg | ORAL_TABLET | Freq: Every day | ORAL | 3 refills | Status: DC

## 2019-02-12 MED ORDER — HYDROCHLOROTHIAZIDE 12.5 MG PO TABS: 12.5000 mg | ORAL_TABLET | Freq: Every day | ORAL | 3 refills | Status: DC

## 2019-02-12 MED ORDER — AMLODIPINE BESYLATE 10 MG PO TABS: 10.0000 mg | ORAL_TABLET | Freq: Every day | ORAL | 3 refills | Status: DC

## 2019-02-12 NOTE — Patient Instructions (Addendum)
Check with your insurance to see if they will cover the shingrix shot. Restart atorvastatin.  Update me as needed.   Thanks for your effort.  Take care.  Glad to see you.

## 2019-02-12 NOTE — Progress Notes (Signed)
I have personally reviewed the Medicare Annual Wellness questionnaire and have noted 1. The patient's medical and social history 2. Their use of alcohol, tobacco or illicit drugs 3. Their current medications and supplements 4. The patient's functional ability including ADL's, fall risks, home safety risks and hearing or visual             impairment. 5. Diet and physical activities 6. Evidence for depression or mood disorders  The patients weight, height, BMI have been recorded in the chart and visual acuity is per eye clinic.  I have made referrals, counseling and provided education to the patient based review of the above and I have provided the pt with a written personalized care plan for preventive services.  Provider list updated- see scanned forms.  Routine anticipatory guidance given to patient.  See health maintenance. The possibility exists that previously documented standard health maintenance information may have been brought forward from a previous encounter into this note.  If needed, that same information has been updated to reflect the current situation based on today's encounter.    Flu encouraged yearly. Shingles discussed with patient. PNA deferred given his history. Tetanus 2017. Colonoscopy 2018. Prostate cancer screening done in 2020. Advance directive-wife designated if patient were incapacitated. Cognitive function addressed- see scanned forms- and if abnormal then additional documentation follows.   He is helping his wife care for her parents.  His mother-in-law and father-in-law both have dementia.  He is still biking up to 40 miles at a time with his daughter.  Hypertension:    Using medication without problems or lightheadedness:  yes Chest pain with exertion:no Edema:no Short of breath:no Labs d/w pt.   Elevated Cholesterol: Using medications without problems: No previous difficulty with Lipitor but had been off medication prior to the lab  collection. Muscle aches: No Diet compliance: Yes  exercise: Yes  The 10-year ASCVD risk score Mikey Bussing DC Jr., et al., 2013) is: 16.8%   Values used to calculate the score:     Age: 68 years     Sex: Male     Is Non-Hispanic African American: Yes     Diabetic: No     Tobacco smoker: No     Systolic Blood Pressure: 573 mmHg     Is BP treated: Yes     HDL Cholesterol: 83.2 mg/dL     Total Cholesterol: 199 mg/dL ASCVD score discussed with patient.  SZ d/o.  No events.  Compliant.  Has seen neurology.  No adverse effect on medication.  Doing well.  PMH and SH reviewed  Meds, vitals, and allergies reviewed.   ROS: Per HPI.  Unless specifically indicated otherwise in HPI, the patient denies:  General: fever. Eyes: acute vision changes ENT: sore throat Cardiovascular: chest pain Respiratory: SOB GI: vomiting GU: dysuria Musculoskeletal: acute back pain Derm: acute rash Neuro: acute motor dysfunction Psych: worsening mood Endocrine: polydipsia Heme: bleeding Allergy: hayfever  GEN: nad, alert and oriented HEENT: ncat NECK: supple w/o LA CV: rrr. PULM: ctab, no inc wob ABD: soft, +bs EXT: no edema SKIN: no acute rash

## 2019-02-15 NOTE — Assessment & Plan Note (Signed)
Advance directive- wife designated if patient were incapacitated.  

## 2019-02-15 NOTE — Assessment & Plan Note (Signed)
Flu encouraged yearly. Shingles discussed with patient. PNA deferred given his history. Tetanus 2017. Colonoscopy 2018. Prostate cancer screening done in 2020. Advance directive-wife designated if patient were incapacitated. Cognitive function addressed- see scanned forms- and if abnormal then additional documentation follows.

## 2019-02-15 NOTE — Assessment & Plan Note (Signed)
Encourage restart statin. He is working hard on diet and exercise.  See above.

## 2019-02-15 NOTE — Assessment & Plan Note (Signed)
Controlled.  Continue current medications.  No adverse effect on meds.  He is working hard on diet and exercise.

## 2019-02-15 NOTE — Assessment & Plan Note (Signed)
No events.  Compliant.  Has seen neurology.  No adverse effect on medication.  Doing well.

## 2020-02-09 ENCOUNTER — Other Ambulatory Visit: Payer: Self-pay | Admitting: Family Medicine

## 2020-02-09 DIAGNOSIS — E785 Hyperlipidemia, unspecified: Secondary | ICD-10-CM

## 2020-02-09 DIAGNOSIS — Z125 Encounter for screening for malignant neoplasm of prostate: Secondary | ICD-10-CM

## 2020-02-15 ENCOUNTER — Ambulatory Visit: Payer: Medicare Other

## 2020-02-15 ENCOUNTER — Other Ambulatory Visit: Payer: Self-pay

## 2020-02-15 ENCOUNTER — Ambulatory Visit (INDEPENDENT_AMBULATORY_CARE_PROVIDER_SITE_OTHER): Payer: Medicare Other

## 2020-02-15 ENCOUNTER — Other Ambulatory Visit (INDEPENDENT_AMBULATORY_CARE_PROVIDER_SITE_OTHER): Payer: Medicare Other

## 2020-02-15 DIAGNOSIS — Z0000 Encounter for general adult medical examination without abnormal findings: Secondary | ICD-10-CM | POA: Diagnosis not present

## 2020-02-15 DIAGNOSIS — Z125 Encounter for screening for malignant neoplasm of prostate: Secondary | ICD-10-CM

## 2020-02-15 DIAGNOSIS — E785 Hyperlipidemia, unspecified: Secondary | ICD-10-CM | POA: Diagnosis not present

## 2020-02-15 LAB — LIPID PANEL
Cholesterol: 186 mg/dL (ref 0–200)
HDL: 79.4 mg/dL (ref >39.00)
LDL Cholesterol: 97 mg/dL (ref 0–99)
NonHDL: 106.4
Total CHOL/HDL Ratio: 2
Triglycerides: 49 mg/dL (ref 0.0–149.0)
VLDL: 9.8 mg/dL (ref 0.0–40.0)

## 2020-02-15 LAB — COMPREHENSIVE METABOLIC PANEL
ALT: 17 U/L (ref 0–53)
AST: 27 U/L (ref 0–37)
Albumin: 4 g/dL (ref 3.5–5.2)
Alkaline Phosphatase: 74 U/L (ref 39–117)
BUN: 20 mg/dL (ref 6–23)
CO2: 28 mEq/L (ref 19–32)
Calcium: 9.7 mg/dL (ref 8.4–10.5)
Chloride: 103 mEq/L (ref 96–112)
Creatinine, Ser: 1.5 mg/dL (ref 0.40–1.50)
GFR: 55.91 mL/min — ABNORMAL LOW (ref >60.00)
Glucose, Bld: 92 mg/dL (ref 70–99)
Potassium: 4.1 mEq/L (ref 3.5–5.1)
Sodium: 138 mEq/L (ref 135–145)
Total Bilirubin: 0.4 mg/dL (ref 0.2–1.2)
Total Protein: 6.8 g/dL (ref 6.0–8.3)

## 2020-02-15 LAB — PSA, MEDICARE: PSA: 1.83 ng/ml (ref 0.10–4.00)

## 2020-02-15 NOTE — Progress Notes (Signed)
Subjective:   Juan Wagner is a 69 y.o. male who presents for Medicare Annual/Subsequent preventive examination.  Review of Systems: N/A      I connected with the patient today by telephone and verified that I am speaking with the correct person using two identifiers. Location patient: home Location nurse: work Persons participating in the virtual visit: patient, Marine scientist.   I discussed the limitations, risks, security and privacy concerns of performing an evaluation and management service by telephone and the availability of in person appointments. I also discussed with the patient that there may be a patient responsible charge related to this service. The patient expressed understanding and verbally consented to this telephonic visit.    Interactive audio and video telecommunications were attempted between this nurse and patient, however failed, due to patient having technical difficulties OR patient did not have access to video capability.  We continued and completed visit with audio only.     Cardiac Risk Factors include: advanced age (>14men, >60 women);male gender;hypertension;dyslipidemia     Objective:    Today's Vitals   There is no height or weight on file to calculate BMI.  Advanced Directives 02/15/2020 02/10/2018 04/15/2017 02/03/2017  Does Patient Have a Medical Advance Directive? Yes Yes Yes Yes  Type of Paramedic of [place];Living will [place];Living will Living will;Healthcare Power of [place];Living will  Copy of [place] in Chart? No - copy requested No - copy requested - No - copy requested    Current Medications (verified) Outpatient Encounter Medications as of 02/15/2020  Medication Sig  . amLODipine (NORVASC) 10 MG tablet Take 1 tablet (10 mg total) by mouth daily.  Marland Kitchen aspirin 81 MG tablet Take 81 mg by mouth daily.    Marland Kitchen atorvastatin (LIPITOR) 10 MG tablet Take 1  tablet (10 mg total) by mouth daily.  . hydrochlorothiazide (HYDRODIURIL) 12.5 MG tablet Take 1 tablet (12.5 mg total) by mouth daily.  . Multiple Vitamin (MULTIVITAMIN) tablet Take 1 tablet by mouth daily.    Marland Kitchen oxcarbazepine (TRILEPTAL) 600 MG tablet Take 600 mg by mouth 2 (two) times daily.   No facility-administered encounter medications on file as of 02/15/2020.    Allergies (verified) Lisinopril, Prevnar [pneumococcal 13-val conj vacc], and Viagra [sildenafil citrate]   History: Past Medical History:  Diagnosis Date  . ED (erectile dysfunction)   . Hyperlipidemia    2018 was 184- past hx   . Hypertension   . Seizures ([place])    1st event 03/2011- no seizure in years as of 2019  . Sleep apnea    no CPAP use as of 2019   Past Surgical History:  Procedure Laterality Date  . COLONOSCOPY    . KNEE SURGERY     scope L knee  . POLYPECTOMY    . SEPTOPLASTY     broken nose   Family History  Problem Relation Age of Onset  . Hypertension Mother   . Cancer Mother        ovarian CAchemo 2010 remission  . Breast cancer Mother   . Ovarian cancer Mother   . Alcohol abuse Father   . Cancer Father   . Throat cancer Father   . Esophageal cancer Father   . Cancer Sister        pancreatic CA Stage IV,mets to lungs  . Pancreatic cancer Sister   . Cirrhosis Brother        smoke and drugs  .  Cancer Brother        lung cancer  . Lung cancer Brother   . Hypertension Maternal Grandmother   . Hypertension Maternal Grandfather   . Prostate cancer Neg Hx   . Colon cancer Neg Hx   . Colon polyps Neg Hx   . Rectal cancer Neg Hx   . Stomach cancer Neg Hx    Social History   Socioeconomic History  . Marital status: Married    Spouse name: Not on file  . Number of children: Not on file  . Years of education: Not on file  . Highest education level: Not on file  Occupational History  . Not on file  Tobacco Use  . Smoking status: Never Smoker  . Smokeless tobacco: Never Used    Vaping Use  . Vaping Use: Never used  Substance and Sexual Activity  . Alcohol use: No    Alcohol/week: 0.0 standard drinks  . Drug use: No  . Sexual activity: Yes  Other Topics Concern  . Not on file  Social History Narrative   Married ~40 years   2 kids   Retired from [employer] in [place], works with recruits as of 2020   Social Determinants of Radio broadcast assistant Strain: [place]   . Difficulty of Paying Living Expenses: Not hard at all  Food Insecurity: No Food Insecurity  . Worried About Charity fundraiser in the Last Year: Never true  . Ran Out of Food in the Last Year: Never true  Transportation Needs: No Transportation Needs  . Lack of Transportation (Medical): No  . Lack of Transportation (Non-Medical): No  Physical Activity: Sufficiently Active  . Days of Exercise per Week: 3 days  . Minutes of Exercise per Session: 150+ min  Stress: No Stress Concern Present  . Feeling of Stress : Not at all  Social Connections:   . Frequency of Communication with Friends and Family:   . Frequency of Social Gatherings with Friends and Family:   . Attends Religious Services:   . Active Member of Clubs or Organizations:   . Attends Archivist Meetings:   Marland Kitchen Marital Status:     Tobacco Counseling Counseling given: Not Answered   Clinical Intake:  Pre-visit preparation completed: Yes  Pain : No/denies pain     Nutritional Risks: None Diabetes: No  How often do you need to have someone help you when you read instructions, pamphlets, or other written materials from your doctor or pharmacy?: 1 - Never What is the last grade level you completed in school?: associates  Diabetic: No Nutrition Risk Assessment:  Has the patient had any N/V/D within the last 2 months?  No  Does the patient have any non-healing wounds?  No  Has the patient had any unintentional weight loss or weight gain?  No   Diabetes:  Is the patient diabetic?  No  If  diabetic, was a CBG obtained today?  No  Did the patient bring in their glucometer from home?  No  How often do you monitor your CBG's? N/A.  Financial Strains and Diabetes Management:  Are you having any financial strains with the device, your supplies or your medication? N/A.  Does the patient want to be seen by Chronic Care Management for management of their diabetes?  N/A Would the patient like to be referred to a Nutritionist or for Diabetic Management?  N/A    Interpreter Needed?: No  Information entered by :: CJohnson,  LPN   Activities of Daily Living In your present state of health, do you have any difficulty performing the following activities: 02/15/2020  Hearing? N  Vision? N  Difficulty concentrating or making decisions? N  Walking or climbing stairs? N  Dressing or bathing? N  Doing errands, shopping? N  Preparing Food and eating ? N  Using the Toilet? N  In the past six months, have you accidently leaked urine? N  Do you have problems with loss of bowel control? N  Managing your Medications? N  Managing your Finances? N  Housekeeping or managing your Housekeeping? N  Some recent data might be hidden    Patient Care Team: Tonia Ghent, MD as PCP - General (Family Medicine) Anabel Bene, MD as Referring Physician (Neurology)  Indicate any recent Medical Services you may have received from other than [facility] providers in the past year (date may be approximate).     Assessment:   This is a routine wellness examination for [place].  Hearing/Vision screen  Hearing Screening   125Hz  250Hz  500Hz  1000Hz  2000Hz  3000Hz  4000Hz  6000Hz  8000Hz   Right ear:           Left ear:           Vision Screening Comments: Patient gets annual eye exams   Dietary issues and exercise activities discussed: Current Exercise Habits: Home exercise routine, Type of exercise: Other - see comments (cycling), Time (Minutes): > 60, Frequency (Times/Week): 3, Weekly Exercise  (Minutes/Week): 0, Intensity: Intense, Exercise limited by: None identified  Goals    . Increase physical activity     Starting 02/03/2017, I will continue to do a variety of different types of exercise daily.    . Increase physical activity     Starting 02/10/2018, I will continue to exercise for at least 60 minutes 6 days per week.     . Patient Stated     02/15/2020, I will continue to ride my bike 3 days a week for 20 miles.       Depression Screen PHQ 2/9 Scores 02/15/2020 02/12/2019 02/10/2018 02/03/2017 01/30/2016 12/08/2014  PHQ - 2 Score 0 0 0 0 0 0  PHQ- 9 Score 0 - 0 - - -    Fall Risk Fall Risk  02/15/2020 02/12/2019 02/10/2018 02/03/2017 01/30/2016  Falls in the past year? 0 0 No No No  Number falls in past yr: 0 - - - -  Injury with Fall? 0 - - - -  Risk for fall due to : Medication side effect - - - -  Follow up Falls evaluation completed;Falls prevention discussed - - - -    Any stairs in or around the home? Yes  If so, are there any without handrails? No  Home free of loose throw rugs in walkways, pet beds, electrical cords, etc? Yes  Adequate lighting in your home to reduce risk of falls? Yes   ASSISTIVE DEVICES UTILIZED TO PREVENT FALLS:  Life alert? No  Use of a cane, [place] or w/c? No  Grab bars in the bathroom? No  Shower chair or bench in shower? No  Elevated toilet seat or a handicapped toilet? No   TIMED UP AND GO:  Was the test performed? N/A, telephonic visit .   Cognitive Function: MMSE - Mini Mental State Exam 02/15/2020 02/10/2018 02/03/2017  Orientation to time 5 5 5   Orientation to Place 5 5 5   Registration 3 3 3   Attention/ Calculation 5 0 0  Recall  2 3 3   Language- name 2 objects - 0 0  Language- repeat 1 1 1   Language- follow 3 step command - 3 3  Language- read & follow direction - 0 0  Write a sentence - 0 0  Copy design - 0 0  Total score - 20 20  Mini Cog  Mini-Cog screen was completed. Maximum score is 22. A value of 0 denotes this  part of the MMSE was not completed or the patient failed this part of the Mini-Cog screening.       Immunizations Immunization History  Administered Date(s) Administered  . Pneumococcal Conjugate-13 12/08/2014  . Td 12/21/2008, 04/17/2016    TDAP status: Up to date Flu Vaccine status: due Fall 2021 Pneumococcal vaccine status: allergic Covid-19 vaccine status: Completed vaccines Will bring copy of card to upcoming physical  Qualifies for Shingles Vaccine? Yes   Zostavax completed No   Shingrix Completed?: No.    Education has been provided regarding the importance of this vaccine. Patient has been advised to call insurance company to determine out of pocket expense if they have not yet received this vaccine. Advised may also receive vaccine at local pharmacy or Health Dept. Verbalized acceptance and understanding.  Screening Tests Health Maintenance  Topic Date Due  . COVID-19 Vaccine (1) Never done  . INFLUENZA VACCINE  02/20/2020  . COLONOSCOPY  04/29/2022  . TETANUS/TDAP  04/17/2026  . Hepatitis C Screening  Completed    Health Maintenance  Health Maintenance Due  Topic Date Due  . COVID-19 Vaccine (1) Never done    Colorectal cancer screening: Completed 04/29/2017. Repeat every 5 years  Lung Cancer Screening: (Low Dose CT Chest recommended if Age 17-80 years, 30 pack-year currently smoking OR have quit w/in 15years.) does not qualify.    Additional Screening:  Hepatitis C Screening: does qualify; Completed 01/30/2016  Vision Screening: Recommended annual ophthalmology exams for early detection of glaucoma and other disorders of the eye. Is the patient up to date with their annual eye exam?  Yes  Who is the provider or what is the name of the office in which the patient attends annual eye exams? [facility] If pt is not established with a provider, would they like to be referred to a provider to establish care? No .   Dental Screening: Recommended annual dental exams  for proper oral hygiene  Community Resource Referral / Chronic Care Management: CRR required this visit?  No   CCM required this visit?  No      Plan:     I have personally reviewed and noted the following in the patient's chart:   . Medical and social history . Use of alcohol, tobacco or illicit drugs  . Current medications and supplements . Functional ability and status . Nutritional status . Physical activity . Advanced directives . List of other physicians . Hospitalizations, surgeries, and ER visits in previous 12 months . Vitals . Screenings to include cognitive, depression, and falls . Referrals and appointments  In addition, I have reviewed and discussed with patient certain preventive protocols, quality metrics, and best practice recommendations. A written personalized care plan for preventive services as well as general preventive health recommendations were provided to patient.   Due to this being a telephonic visit, the after visit summary with patients personalized plan was offered to patient via mail or my-chart. Patient preferred to pick up at office at next visit.   Andrez Grime, LPN   [id number]

## 2020-02-15 NOTE — Progress Notes (Signed)
PCP notes:  Health Maintenance: No gaps noted Will bring covid vaccine card to physical   Abnormal Screenings: none   Patient concerns: none   Nurse concerns: none   Next PCP appt.: 02/18/2020 @ 10 am

## 2020-02-15 NOTE — Patient Instructions (Signed)
[name] , Thank you for taking time to come for your Medicare Wellness Visit. I appreciate your ongoing commitment to your health goals. Please review the following plan we discussed and let me know if I can assist you in the future.   Screening recommendations/referrals: Colonoscopy: Up to date, completed 04/29/2017, due 04/2022 Recommended yearly ophthalmology/optometry visit for glaucoma screening and checkup Recommended yearly dental visit for hygiene and checkup  Vaccinations: Influenza vaccine: due Fall 2021 Pneumococcal vaccine: allergic Tdap vaccine: Up to date, completed 04/17/2016, due 03/2026 Shingles vaccine: due, check with insurance regarding coverage   Covid-19: Completed series  Advanced directives: Please bring a copy of your POA (Power of Attorney) and/or Living Will to your next appointment.  Conditions/risks identified: hypertension, hyperlipidemia  Next appointment: Follow up in one year for your annual wellness visit.   Preventive Care 22 Years and Older, Male Preventive care refers to lifestyle choices and visits with your health care provider that can promote health and wellness. What does preventive care include?  A yearly physical exam. This is also called an annual well check.  Dental exams once or twice a year.  Routine eye exams. Ask your health care provider how often you should have your eyes checked.  Personal lifestyle choices, including:  Daily care of your teeth and gums.  Regular physical activity.  Eating a healthy diet.  Avoiding tobacco and drug use.  Limiting alcohol use.  Practicing safe sex.  Taking low doses of aspirin every day.  Taking vitamin and mineral supplements as recommended by your health care provider. What happens during an annual well check? The services and screenings done by your health care provider during your annual well check will depend on your age, overall health, lifestyle risk factors, and family  history of disease. Counseling  Your health care provider may ask you questions about your:  Alcohol use.  Tobacco use.  Drug use.  Emotional well-being.  Home and relationship well-being.  Sexual activity.  Eating habits.  History of falls.  Memory and ability to understand (cognition).  Work and work Statistician. Screening  You may have the following tests or measurements:  Height, weight, and BMI.  Blood pressure.  Lipid and cholesterol levels. These may be checked every 5 years, or more frequently if you are over 43 years old.  Skin check.  Lung cancer screening. You may have this screening every year starting at age 14 if you have a 30-pack-year history of smoking and currently smoke or have quit within the past 15 years.  Fecal occult blood test (FOBT) of the stool. You may have this test every year starting at age 24.  Flexible sigmoidoscopy or colonoscopy. You may have a sigmoidoscopy every 5 years or a colonoscopy every 10 years starting at age 95.  Prostate cancer screening. Recommendations will vary depending on your family history and other risks.  Hepatitis C blood test.  Hepatitis B blood test.  Sexually transmitted disease (STD) testing.  Diabetes screening. This is done by checking your blood sugar (glucose) after you have not eaten for a while (fasting). You may have this done every 1-3 years.  Abdominal aortic aneurysm (AAA) screening. You may need this if you are a current or former smoker.  Osteoporosis. You may be screened starting at age 65 if you are at high risk. Talk with your health care provider about your test results, treatment options, and if necessary, the need for more tests. Vaccines  Your health care provider may  recommend certain vaccines, such as:  Influenza vaccine. This is recommended every year.  Tetanus, diphtheria, and acellular pertussis (Tdap, Td) vaccine. You may need a Td booster every 10 years.  Zoster vaccine.  You may need this after age 25.  Pneumococcal 13-valent conjugate (PCV13) vaccine. One dose is recommended after age 17.  Pneumococcal polysaccharide (PPSV23) vaccine. One dose is recommended after age 63. Talk to your health care provider about which screenings and vaccines you need and how often you need them. This information is not intended to replace advice given to you by your health care provider. Make sure you discuss any questions you have with your health care provider. Document Released: 08/04/2015 Document Revised: 03/27/2016 Document Reviewed: 05/09/2015 Elsevier Interactive Patient Education  2017 [place] Prevention in the Home Falls can cause injuries. They can happen to people of all ages. There are many things you can do to make your home safe and to help prevent falls. What can I do on the outside of my home?  Regularly fix the edges of walkways and driveways and fix any cracks.  Remove anything that might make you trip as you walk through a door, such as a raised step or threshold.  Trim any bushes or trees on the path to your home.  Use bright outdoor lighting.  Clear any walking paths of anything that might make someone trip, such as rocks or tools.  Regularly check to see if handrails are loose or broken. Make sure that both sides of any steps have handrails.  Any raised decks and porches should have guardrails on the edges.  Have any leaves, snow, or ice cleared regularly.  Use sand or salt on walking paths during winter.  Clean up any spills in your garage right away. This includes oil or grease spills. What can I do in the bathroom?  Use night lights.  Install grab bars by the toilet and in the tub and shower. Do not use towel bars as grab bars.  Use non-skid mats or decals in the tub or shower.  If you need to sit down in the shower, use a plastic, non-slip stool.  Keep the floor dry. Clean up any water that spills on the floor as soon  as it happens.  Remove soap buildup in the tub or shower regularly.  Attach bath mats securely with double-sided non-slip rug tape.  Do not have throw rugs and other things on the floor that can make you trip. What can I do in the bedroom?  Use night lights.  Make sure that you have a light by your bed that is easy to reach.  Do not use any sheets or blankets that are too big for your bed. They should not hang down onto the floor.  Have a firm chair that has side arms. You can use this for support while you get dressed.  Do not have throw rugs and other things on the floor that can make you trip. What can I do in the kitchen?  Clean up any spills right away.  Avoid walking on wet floors.  Keep items that you use a lot in easy-to-reach places.  If you need to reach something above you, use a strong step stool that has a grab bar.  Keep electrical cords out of the way.  Do not use floor polish or wax that makes floors slippery. If you must use wax, use non-skid floor wax.  Do not have throw rugs and  other things on the floor that can make you trip. What can I do with my stairs?  Do not leave any items on the stairs.  Make sure that there are handrails on both sides of the stairs and use them. Fix handrails that are broken or loose. Make sure that handrails are as long as the stairways.  Check any carpeting to make sure that it is firmly attached to the stairs. Fix any carpet that is loose or worn.  Avoid having throw rugs at the top or bottom of the stairs. If you do have throw rugs, attach them to the floor with carpet tape.  Make sure that you have a light switch at the top of the stairs and the bottom of the stairs. If you do not have them, ask someone to add them for you. What else can I do to help prevent falls?  Wear shoes that:  Do not have high heels.  Have rubber bottoms.  Are comfortable and fit you well.  Are closed at the toe. Do not wear sandals.  If  you use a stepladder:  Make sure that it is fully opened. Do not climb a closed stepladder.  Make sure that both sides of the stepladder are locked into place.  Ask someone to hold it for you, if possible.  Clearly mark and make sure that you can see:  Any grab bars or handrails.  First and last steps.  Where the edge of each step is.  Use tools that help you move around (mobility aids) if they are needed. These include:  Canes.  Walkers.  Scooters.  Crutches.  Turn on the lights when you go into a dark area. Replace any light bulbs as soon as they burn out.  Set up your furniture so you have a clear path. Avoid moving your furniture around.  If any of your floors are uneven, fix them.  If there are any pets around you, be aware of where they are.  Review your medicines with your doctor. Some medicines can make you feel dizzy. This can increase your chance of falling. Ask your doctor what other things that you can do to help prevent falls. This information is not intended to replace advice given to you by your health care provider. Make sure you discuss any questions you have with your health care provider. Document Released: 05/04/2009 Document Revised: 12/14/2015 Document Reviewed: 08/12/2014 Elsevier Interactive Patient Education  2017 [employer].

## 2020-02-18 ENCOUNTER — Ambulatory Visit (INDEPENDENT_AMBULATORY_CARE_PROVIDER_SITE_OTHER): Payer: Medicare Other | Admitting: Family Medicine

## 2020-02-18 ENCOUNTER — Encounter: Payer: Self-pay | Admitting: Family Medicine

## 2020-02-18 ENCOUNTER — Other Ambulatory Visit: Payer: Self-pay

## 2020-02-18 VITALS — BP 142/88 | HR 63 | Temp 97.5°F | Ht 69.5 in | Wt 178.1 lb

## 2020-02-18 DIAGNOSIS — R569 Unspecified convulsions: Secondary | ICD-10-CM

## 2020-02-18 DIAGNOSIS — Z0000 Encounter for general adult medical examination without abnormal findings: Secondary | ICD-10-CM

## 2020-02-18 DIAGNOSIS — Z7189 Other specified counseling: Secondary | ICD-10-CM

## 2020-02-18 DIAGNOSIS — I10 Essential (primary) hypertension: Secondary | ICD-10-CM | POA: Diagnosis not present

## 2020-02-18 DIAGNOSIS — E785 Hyperlipidemia, unspecified: Secondary | ICD-10-CM | POA: Diagnosis not present

## 2020-02-18 MED ORDER — HYDROCHLOROTHIAZIDE 12.5 MG PO TABS: 12.5000 mg | ORAL_TABLET | Freq: Every day | ORAL | 3 refills | Status: DC

## 2020-02-18 MED ORDER — AMLODIPINE BESYLATE 10 MG PO TABS: 10.0000 mg | ORAL_TABLET | Freq: Every day | ORAL | 3 refills | Status: DC

## 2020-02-18 MED ORDER — ATORVASTATIN CALCIUM 10 MG PO TABS: 10.0000 mg | ORAL_TABLET | Freq: Every day | ORAL | 3 refills | Status: DC

## 2020-02-18 NOTE — Progress Notes (Signed)
This visit occurred during the SARS-CoV-2 public health emergency.  Safety protocols were in place, including screening questions prior to the visit, additional usage of staff PPE, and extensive cleaning of exam room while observing appropriate contact time as indicated for disinfecting solutions.  Hypertension:    Using medication without problems or lightheadedness: yes Chest pain with exertion:no Edema:no Short of breath:no Still on amlodipine and HCTZ.    He has been running and biking at baseline, which for him is a high baseline, almost 100 miles on the bike weekly plus other exercise.    Elevated Cholesterol: Using medications without problems: yes Muscle aches: no Diet compliance:yes Exercise: yes  Still on trileptal at baseline.  No ADE on med.  No events.  Still seeing neurology.    Reports sleeping 6-7 hours nightly. Denies difficulty staying asleep or falling asleep.   Flu encouraged yearly. Shingles discussed with patient. PNA deferred given his history. Tetanus 2017. covid vaccine 2021 Colonoscopy 2018. Prostate cancer screening done in 2021 Advance directive-wife designated if patient were incapacitated.  We talked about his memory.  He recalled today that he had trouble on the call with 1/3 on recall.  We rechecked him today.  He was 3/3 today.  He doesn't have red flag events.  We can recheck yearly.    Meds, vitals, and allergies reviewed.   PMH and SH reviewed  ROS: Per HPI unless specifically indicated in ROS section   GEN: nad, alert and oriented HEENT: ncat NECK: supple w/o LA CV: rrr. PULM: ctab, no inc wob ABD: soft, +bs EXT: no edema SKIN: no acute rash

## 2020-02-18 NOTE — Patient Instructions (Signed)
Ask for a lab visit on a Thursday, prior to working out that day.  Take it easy the day prior.  Drink enough water to keep your urine clear prior to the lab visit. You don't have to fast.  Take care.  Glad to see you. Thanks for your effort.

## 2020-02-20 NOTE — Assessment & Plan Note (Signed)
Continue atorvastatin.  Continue work on diet and exercise.  Labs discussed with patient.  He agrees.

## 2020-02-20 NOTE — Assessment & Plan Note (Signed)
Flu encouraged yearly. Shingles discussed with patient. PNA deferred given his history. Tetanus 2017. covid vaccine 2021 Colonoscopy 2018. Prostate cancer screening done in 2021 Advance directive-wife designated if patient were incapacitated.  We talked about his memory.  He recalled today that he had trouble on the call with 1/3 on recall.  We rechecked him today.  He was 3/3 today.  He doesn't have red flag events.  We can recheck yearly.

## 2020-02-20 NOTE — Assessment & Plan Note (Signed)
Still on trileptal at baseline.  No ADE on med.  No events. Still seeing neurology.   I will defer.  He agrees.

## 2020-02-20 NOTE — Assessment & Plan Note (Signed)
Advance directive- wife designated if patient were incapacitated.  

## 2020-02-20 NOTE — Assessment & Plan Note (Signed)
I suspect his creatinine elevation, which is mild, is likely influenced by relative dehydration, high muscle mass, high amount of exercise prior to lab collection.  I asked him to come back for follow-up labs when well hydrated.  See after visit summary.  No change in medications otherwise at this point he agrees with plan.

## 2020-02-24 ENCOUNTER — Other Ambulatory Visit (INDEPENDENT_AMBULATORY_CARE_PROVIDER_SITE_OTHER): Payer: Medicare Other

## 2020-02-24 ENCOUNTER — Other Ambulatory Visit: Payer: Self-pay

## 2020-02-24 DIAGNOSIS — I10 Essential (primary) hypertension: Secondary | ICD-10-CM

## 2020-02-24 LAB — BASIC METABOLIC PANEL
BUN: 15 mg/dL (ref 6–23)
CO2: 30 mEq/L (ref 19–32)
Calcium: 9.6 mg/dL (ref 8.4–10.5)
Chloride: 102 mEq/L (ref 96–112)
Creatinine, Ser: 1.24 mg/dL (ref 0.40–1.50)
GFR: 69.64 mL/min (ref >60.00)
Glucose, Bld: 91 mg/dL (ref 70–99)
Potassium: 3.8 mEq/L (ref 3.5–5.1)
Sodium: 138 mEq/L (ref 135–145)

## 2020-03-08 ENCOUNTER — Other Ambulatory Visit: Payer: Self-pay | Admitting: Family Medicine

## 2020-03-08 NOTE — Telephone Encounter (Signed)
[facility] and they had refill and pt picked up. Contacted pt and he said he picked up refill.

## 2020-03-08 NOTE — Telephone Encounter (Signed)
Pt needs a refill on amlodipine sent [facility]. He called the pharmacy but they told him need a new prescription. Pt currently has 2 pills left.

## 2020-05-09 ENCOUNTER — Ambulatory Visit: Payer: Medicare Other

## 2020-05-29 ENCOUNTER — Emergency Department (HOSPITAL_COMMUNITY): Payer: Medicare Other

## 2020-05-29 ENCOUNTER — Emergency Department (HOSPITAL_COMMUNITY): Payer: Medicare Other | Admitting: Certified Registered Nurse Anesthetist

## 2020-05-29 ENCOUNTER — Other Ambulatory Visit: Payer: Self-pay

## 2020-05-29 ENCOUNTER — Inpatient Hospital Stay (HOSPITAL_COMMUNITY)
Admission: EM | Admit: 2020-05-29 | Discharge: 2020-06-06 | DRG: 908 | Disposition: A | Discharge status: Home / Self Care | Payer: Medicare Other | Attending: Cardiothoracic Surgery | Admitting: Cardiothoracic Surgery

## 2020-05-29 ENCOUNTER — Encounter (HOSPITAL_COMMUNITY)
Admission: EM | Disposition: A | Discharge status: Home / Self Care | Payer: Self-pay | Source: Home / Self Care | Attending: Cardiothoracic Surgery

## 2020-05-29 ENCOUNTER — Inpatient Hospital Stay (HOSPITAL_COMMUNITY): Payer: Medicare Other

## 2020-05-29 DIAGNOSIS — Y9289 Other specified places as the place of occurrence of the external cause: Secondary | ICD-10-CM | POA: Diagnosis not present

## 2020-05-29 DIAGNOSIS — I4891 Unspecified atrial fibrillation: Secondary | ICD-10-CM | POA: Diagnosis not present

## 2020-05-29 DIAGNOSIS — I4892 Unspecified atrial flutter: Secondary | ICD-10-CM | POA: Diagnosis not present

## 2020-05-29 DIAGNOSIS — I10 Essential (primary) hypertension: Secondary | ICD-10-CM | POA: Diagnosis present

## 2020-05-29 DIAGNOSIS — Y93B2 Activity, push-ups, pull-ups, sit-ups: Secondary | ICD-10-CM | POA: Diagnosis not present

## 2020-05-29 DIAGNOSIS — Z79899 Other long term (current) drug therapy: Secondary | ICD-10-CM | POA: Diagnosis not present

## 2020-05-29 DIAGNOSIS — E7800 Pure hypercholesterolemia, unspecified: Secondary | ICD-10-CM | POA: Diagnosis present

## 2020-05-29 DIAGNOSIS — I959 Hypotension, unspecified: Secondary | ICD-10-CM | POA: Diagnosis present

## 2020-05-29 DIAGNOSIS — S27892A Contusion of other specified intrathoracic organs, initial encounter: Secondary | ICD-10-CM | POA: Diagnosis present

## 2020-05-29 DIAGNOSIS — Z20822 Contact with and (suspected) exposure to covid-19: Secondary | ICD-10-CM | POA: Diagnosis present

## 2020-05-29 DIAGNOSIS — D649 Anemia, unspecified: Secondary | ICD-10-CM | POA: Diagnosis present

## 2020-05-29 DIAGNOSIS — G4733 Obstructive sleep apnea (adult) (pediatric): Secondary | ICD-10-CM | POA: Diagnosis present

## 2020-05-29 DIAGNOSIS — S2509XA Other specified injury of thoracic aorta, initial encounter: Secondary | ICD-10-CM | POA: Diagnosis present

## 2020-05-29 DIAGNOSIS — J90 Pleural effusion, not elsewhere classified: Secondary | ICD-10-CM

## 2020-05-29 DIAGNOSIS — I71019 Dissection of thoracic aorta, unspecified: Secondary | ICD-10-CM

## 2020-05-29 DIAGNOSIS — R55 Syncope and collapse: Secondary | ICD-10-CM

## 2020-05-29 DIAGNOSIS — R0902 Hypoxemia: Secondary | ICD-10-CM | POA: Diagnosis present

## 2020-05-29 DIAGNOSIS — Z09 Encounter for follow-up examination after completed treatment for conditions other than malignant neoplasm: Secondary | ICD-10-CM

## 2020-05-29 DIAGNOSIS — I7101 Dissection of ascending aorta: Secondary | ICD-10-CM

## 2020-05-29 DIAGNOSIS — Z7982 Long term (current) use of aspirin: Secondary | ICD-10-CM | POA: Diagnosis not present

## 2020-05-29 DIAGNOSIS — E785 Hyperlipidemia, unspecified: Secondary | ICD-10-CM | POA: Diagnosis present

## 2020-05-29 DIAGNOSIS — I7102 Dissection of abdominal aorta: Secondary | ICD-10-CM

## 2020-05-29 DIAGNOSIS — Z9889 Other specified postprocedural states: Secondary | ICD-10-CM

## 2020-05-29 HISTORY — DX: Dissection of thoracic aorta, unspecified: I71.019

## 2020-05-29 HISTORY — PX: TEE WITHOUT CARDIOVERSION: SHX5443

## 2020-05-29 HISTORY — DX: Dissection of thoracic aorta: I71.01

## 2020-05-29 HISTORY — PX: REPAIR OF ACUTE ASCENDING THORACIC AORTIC DISSECTION: SHX6323

## 2020-05-29 LAB — POCT I-STAT, CHEM 8
BUN: 16 mg/dL (ref 8–23)
BUN: 16 mg/dL (ref 8–23)
BUN: 17 mg/dL (ref 8–23)
BUN: 17 mg/dL (ref 8–23)
BUN: 18 mg/dL (ref 8–23)
BUN: 19 mg/dL (ref 8–23)
BUN: 18 mg/dL (ref 8–23)
Calcium, Ion: 1.22 mmol/L (ref 1.15–1.40)
Calcium, Ion: 1.22 mmol/L (ref 1.15–1.40)
Calcium, Ion: 1.1 mmol/L — ABNORMAL LOW (ref 1.15–1.40)
Calcium, Ion: 0.89 mmol/L — CL (ref 1.15–1.40)
Calcium, Ion: 1.02 mmol/L — ABNORMAL LOW (ref 1.15–1.40)
Calcium, Ion: 1.12 mmol/L — ABNORMAL LOW (ref 1.15–1.40)
Calcium, Ion: 1.19 mmol/L (ref 1.15–1.40)
Chloride: 104 mmol/L (ref 98–111)
Chloride: 106 mmol/L (ref 98–111)
Chloride: 102 mmol/L (ref 98–111)
Chloride: 100 mmol/L (ref 98–111)
Chloride: 99 mmol/L (ref 98–111)
Chloride: 101 mmol/L (ref 98–111)
Chloride: 103 mmol/L (ref 98–111)
Creatinine, Ser: 0.8 mg/dL (ref 0.61–1.24)
Creatinine, Ser: 0.9 mg/dL (ref 0.61–1.24)
Creatinine, Ser: 0.8 mg/dL (ref 0.61–1.24)
Creatinine, Ser: 0.8 mg/dL (ref 0.61–1.24)
Creatinine, Ser: 0.8 mg/dL (ref 0.61–1.24)
Creatinine, Ser: 1 mg/dL (ref 0.61–1.24)
Creatinine, Ser: 0.9 mg/dL (ref 0.61–1.24)
Glucose, Bld: 170 mg/dL — ABNORMAL HIGH (ref 70–99)
Glucose, Bld: 148 mg/dL — ABNORMAL HIGH (ref 70–99)
Glucose, Bld: 125 mg/dL — ABNORMAL HIGH (ref 70–99)
Glucose, Bld: 168 mg/dL — ABNORMAL HIGH (ref 70–99)
Glucose, Bld: 174 mg/dL — ABNORMAL HIGH (ref 70–99)
Glucose, Bld: 170 mg/dL — ABNORMAL HIGH (ref 70–99)
Glucose, Bld: 148 mg/dL — ABNORMAL HIGH (ref 70–99)
HCT: 36 % — ABNORMAL LOW (ref 39.0–52.0)
HCT: 29 % — ABNORMAL LOW (ref 39.0–52.0)
HCT: 25 % — ABNORMAL LOW (ref 39.0–52.0)
HCT: 21 % — ABNORMAL LOW (ref 39.0–52.0)
HCT: 21 % — ABNORMAL LOW (ref 39.0–52.0)
HCT: 26 % — ABNORMAL LOW (ref 39.0–52.0)
HCT: 26 % — ABNORMAL LOW (ref 39.0–52.0)
Hemoglobin: 12.2 g/dL — ABNORMAL LOW (ref 13.0–17.0)
Hemoglobin: 9.9 g/dL — ABNORMAL LOW (ref 13.0–17.0)
Hemoglobin: 8.5 g/dL — ABNORMAL LOW (ref 13.0–17.0)
Hemoglobin: 7.1 g/dL — ABNORMAL LOW (ref 13.0–17.0)
Hemoglobin: 7.1 g/dL — ABNORMAL LOW (ref 13.0–17.0)
Hemoglobin: 8.8 g/dL — ABNORMAL LOW (ref 13.0–17.0)
Hemoglobin: 8.8 g/dL — ABNORMAL LOW (ref 13.0–17.0)
Potassium: 4.4 mmol/L (ref 3.5–5.1)
Potassium: 5.5 mmol/L — ABNORMAL HIGH (ref 3.5–5.1)
Potassium: 6.1 mmol/L — ABNORMAL HIGH (ref 3.5–5.1)
Potassium: 4.4 mmol/L (ref 3.5–5.1)
Potassium: 5.1 mmol/L (ref 3.5–5.1)
Potassium: 4.8 mmol/L (ref 3.5–5.1)
Potassium: 5.1 mmol/L (ref 3.5–5.1)
Sodium: 140 mmol/L (ref 135–145)
Sodium: 138 mmol/L (ref 135–145)
Sodium: 136 mmol/L (ref 135–145)
Sodium: 131 mmol/L — ABNORMAL LOW (ref 135–145)
Sodium: 146 mmol/L — ABNORMAL HIGH (ref 135–145)
Sodium: 138 mmol/L (ref 135–145)
Sodium: 141 mmol/L (ref 135–145)
TCO2: 23 mmol/L (ref 22–32)
TCO2: 22 mmol/L (ref 22–32)
TCO2: 25 mmol/L (ref 22–32)
TCO2: 22 mmol/L (ref 22–32)
TCO2: 36 mmol/L — ABNORMAL HIGH (ref 22–32)
TCO2: 23 mmol/L (ref 22–32)
TCO2: 27 mmol/L (ref 22–32)

## 2020-05-29 LAB — COMPREHENSIVE METABOLIC PANEL
ALT: 24 U/L (ref 0–44)
AST: 40 U/L (ref 15–41)
Albumin: 3.5 g/dL (ref 3.5–5.0)
Alkaline Phosphatase: 74 U/L (ref 38–126)
Anion gap: 12 (ref 5–15)
BUN: 16 mg/dL (ref 8–23)
CO2: 22 mmol/L (ref 22–32)
Calcium: 9.1 mg/dL (ref 8.9–10.3)
Chloride: 104 mmol/L (ref 98–111)
Creatinine, Ser: 1.37 mg/dL — ABNORMAL HIGH (ref 0.61–1.24)
GFR, Estimated: 55 mL/min — ABNORMAL LOW (ref >60)
Glucose, Bld: 194 mg/dL — ABNORMAL HIGH (ref 70–99)
Potassium: 3.4 mmol/L — ABNORMAL LOW (ref 3.5–5.1)
Sodium: 138 mmol/L (ref 135–145)
Total Bilirubin: 0.6 mg/dL (ref 0.3–1.2)
Total Protein: 6.7 g/dL (ref 6.5–8.1)

## 2020-05-29 LAB — POCT I-STAT 7, (LYTES, BLD GAS, ICA,H+H)
Acid-Base Excess: 0 mmol/L (ref 0.0–2.0)
Acid-Base Excess: 1 mmol/L (ref 0.0–2.0)
Acid-Base Excess: 3 mmol/L — ABNORMAL HIGH (ref 0.0–2.0)
Acid-Base Excess: 1 mmol/L (ref 0.0–2.0)
Acid-base deficit: 2 mmol/L (ref 0.0–2.0)
Acid-base deficit: 4 mmol/L — ABNORMAL HIGH (ref 0.0–2.0)
Acid-base deficit: 2 mmol/L (ref 0.0–2.0)
Bicarbonate: 25 mmol/L (ref 20.0–28.0)
Bicarbonate: 25.6 mmol/L (ref 20.0–28.0)
Bicarbonate: 25.7 mmol/L (ref 20.0–28.0)
Bicarbonate: 23.2 mmol/L (ref 20.0–28.0)
Bicarbonate: 24.3 mmol/L (ref 20.0–28.0)
Bicarbonate: 26.3 mmol/L (ref 20.0–28.0)
Bicarbonate: 26.1 mmol/L (ref 20.0–28.0)
Calcium, Ion: 1.24 mmol/L (ref 1.15–1.40)
Calcium, Ion: 1.05 mmol/L — ABNORMAL LOW (ref 1.15–1.40)
Calcium, Ion: 1.05 mmol/L — ABNORMAL LOW (ref 1.15–1.40)
Calcium, Ion: 1.08 mmol/L — ABNORMAL LOW (ref 1.15–1.40)
Calcium, Ion: 0.94 mmol/L — ABNORMAL LOW (ref 1.15–1.40)
Calcium, Ion: 1.1 mmol/L — ABNORMAL LOW (ref 1.15–1.40)
Calcium, Ion: 1.25 mmol/L (ref 1.15–1.40)
HCT: 35 % — ABNORMAL LOW (ref 39.0–52.0)
HCT: 26 % — ABNORMAL LOW (ref 39.0–52.0)
HCT: 25 % — ABNORMAL LOW (ref 39.0–52.0)
HCT: 23 % — ABNORMAL LOW (ref 39.0–52.0)
HCT: 24 % — ABNORMAL LOW (ref 39.0–52.0)
HCT: 25 % — ABNORMAL LOW (ref 39.0–52.0)
HCT: 23 % — ABNORMAL LOW (ref 39.0–52.0)
Hemoglobin: 11.9 g/dL — ABNORMAL LOW (ref 13.0–17.0)
Hemoglobin: 8.8 g/dL — ABNORMAL LOW (ref 13.0–17.0)
Hemoglobin: 8.5 g/dL — ABNORMAL LOW (ref 13.0–17.0)
Hemoglobin: 7.8 g/dL — ABNORMAL LOW (ref 13.0–17.0)
Hemoglobin: 8.2 g/dL — ABNORMAL LOW (ref 13.0–17.0)
Hemoglobin: 8.5 g/dL — ABNORMAL LOW (ref 13.0–17.0)
Hemoglobin: 7.8 g/dL — ABNORMAL LOW (ref 13.0–17.0)
O2 Saturation: 99 %
O2 Saturation: 100 %
O2 Saturation: 100 %
O2 Saturation: 100 %
O2 Saturation: 100 %
O2 Saturation: 100 %
O2 Saturation: 99 %
Patient temperature: 35.9
Potassium: 4.5 mmol/L (ref 3.5–5.1)
Potassium: 5.5 mmol/L — ABNORMAL HIGH (ref 3.5–5.1)
Potassium: 5.7 mmol/L — ABNORMAL HIGH (ref 3.5–5.1)
Potassium: 5.1 mmol/L (ref 3.5–5.1)
Potassium: 4.9 mmol/L (ref 3.5–5.1)
Potassium: 5.1 mmol/L (ref 3.5–5.1)
Potassium: 5 mmol/L (ref 3.5–5.1)
Sodium: 140 mmol/L (ref 135–145)
Sodium: 139 mmol/L (ref 135–145)
Sodium: 136 mmol/L (ref 135–145)
Sodium: 136 mmol/L (ref 135–145)
Sodium: 139 mmol/L (ref 135–145)
Sodium: 139 mmol/L (ref 135–145)
Sodium: 140 mmol/L (ref 135–145)
TCO2: 27 mmol/L (ref 22–32)
TCO2: 27 mmol/L (ref 22–32)
TCO2: 27 mmol/L (ref 22–32)
TCO2: 25 mmol/L (ref 22–32)
TCO2: 26 mmol/L (ref 22–32)
TCO2: 27 mmol/L (ref 22–32)
TCO2: 27 mmol/L (ref 22–32)
pCO2 arterial: 50.5 mmHg — ABNORMAL HIGH (ref 32.0–48.0)
pCO2 arterial: 47.5 mmHg (ref 32.0–48.0)
pCO2 arterial: 42.4 mmHg (ref 32.0–48.0)
pCO2 arterial: 53.4 mmHg — ABNORMAL HIGH (ref 32.0–48.0)
pCO2 arterial: 33.5 mmHg (ref 32.0–48.0)
pCO2 arterial: 48.3 mmHg — ABNORMAL HIGH (ref 32.0–48.0)
pCO2 arterial: 42.8 mmHg (ref 32.0–48.0)
pH, Arterial: 7.303 — ABNORMAL LOW (ref 7.350–7.450)
pH, Arterial: 7.339 — ABNORMAL LOW (ref 7.350–7.450)
pH, Arterial: 7.392 (ref 7.350–7.450)
pH, Arterial: 7.245 — ABNORMAL LOW (ref 7.350–7.450)
pH, Arterial: 7.31 — ABNORMAL LOW (ref 7.350–7.450)
pH, Arterial: 7.503 — ABNORMAL HIGH (ref 7.350–7.450)
pH, Arterial: 7.389 (ref 7.350–7.450)
pO2, Arterial: 168 mmHg — ABNORMAL HIGH (ref 83.0–108.0)
pO2, Arterial: 331 mmHg — ABNORMAL HIGH (ref 83.0–108.0)
pO2, Arterial: 370 mmHg — ABNORMAL HIGH (ref 83.0–108.0)
pO2, Arterial: 389 mmHg — ABNORMAL HIGH (ref 83.0–108.0)
pO2, Arterial: 248 mmHg — ABNORMAL HIGH (ref 83.0–108.0)
pO2, Arterial: 298 mmHg — ABNORMAL HIGH (ref 83.0–108.0)
pO2, Arterial: 118 mmHg — ABNORMAL HIGH (ref 83.0–108.0)

## 2020-05-29 LAB — CBC WITH DIFFERENTIAL/PLATELET
Abs Immature Granulocytes: 0.03 10*3/uL (ref 0.00–0.07)
Basophils Absolute: 0.1 10*3/uL (ref 0.0–0.1)
Basophils Relative: 1 %
Eosinophils Absolute: 0.1 10*3/uL (ref 0.0–0.5)
Eosinophils Relative: 1 %
HCT: 40.5 % (ref 39.0–52.0)
Hemoglobin: 12.8 g/dL — ABNORMAL LOW (ref 13.0–17.0)
Immature Granulocytes: 1 %
Lymphocytes Relative: 26 %
Lymphs Abs: 1.6 10*3/uL (ref 0.7–4.0)
MCH: 29.7 pg (ref 26.0–34.0)
MCHC: 31.6 g/dL (ref 30.0–36.0)
MCV: 94 fL (ref 80.0–100.0)
Monocytes Absolute: 0.8 10*3/uL (ref 0.1–1.0)
Monocytes Relative: 13 %
Neutro Abs: 3.6 10*3/uL (ref 1.7–7.7)
Neutrophils Relative %: 58 %
Platelets: 221 10*3/uL (ref 150–400)
RBC: 4.31 MIL/uL (ref 4.22–5.81)
RDW: 12 % (ref 11.5–15.5)
WBC: 6 10*3/uL (ref 4.0–10.5)
nRBC: 0 % (ref 0.0–0.2)

## 2020-05-29 LAB — POCT I-STAT EG7
Acid-base deficit: 3 mmol/L — ABNORMAL HIGH (ref 0.0–2.0)
Bicarbonate: 23.6 mmol/L (ref 20.0–28.0)
Calcium, Ion: 1.12 mmol/L — ABNORMAL LOW (ref 1.15–1.40)
HCT: 26 % — ABNORMAL LOW (ref 39.0–52.0)
Hemoglobin: 8.8 g/dL — ABNORMAL LOW (ref 13.0–17.0)
O2 Saturation: 72 %
Potassium: 5.3 mmol/L — ABNORMAL HIGH (ref 3.5–5.1)
Sodium: 141 mmol/L (ref 135–145)
TCO2: 25 mmol/L (ref 22–32)
pCO2, Ven: 50.3 mmHg (ref 44.0–60.0)
pH, Ven: 7.279 (ref 7.250–7.430)
pO2, Ven: 43 mmHg (ref 32.0–45.0)

## 2020-05-29 LAB — RESPIRATORY PANEL BY RT PCR (FLU A&B, COVID)
Influenza A by PCR: NEGATIVE
Influenza B by PCR: NEGATIVE
SARS Coronavirus 2 by RT PCR: NEGATIVE

## 2020-05-29 LAB — PROTIME-INR
INR: 1 (ref 0.8–1.2)
Prothrombin Time: 12.3 seconds (ref 11.4–15.2)

## 2020-05-29 LAB — CBG MONITORING, ED: Glucose-Capillary: 187 mg/dL — ABNORMAL HIGH (ref 70–99)

## 2020-05-29 LAB — PLATELET COUNT: Platelets: 106 10*3/uL — ABNORMAL LOW (ref 150–400)

## 2020-05-29 LAB — CBC
HCT: 27 % — ABNORMAL LOW (ref 39.0–52.0)
Hemoglobin: 8.8 g/dL — ABNORMAL LOW (ref 13.0–17.0)
MCH: 29.2 pg (ref 26.0–34.0)
MCHC: 32.6 g/dL (ref 30.0–36.0)
MCV: 89.7 fL (ref 80.0–100.0)
Platelets: 91 10*3/uL — ABNORMAL LOW (ref 150–400)
RBC: 3.01 MIL/uL — ABNORMAL LOW (ref 4.22–5.81)
RDW: 14.1 % (ref 11.5–15.5)
WBC: 8.1 10*3/uL (ref 4.0–10.5)
nRBC: 0 % (ref 0.0–0.2)

## 2020-05-29 LAB — PREPARE RBC (CROSSMATCH)

## 2020-05-29 LAB — ECHOCARDIOGRAM COMPLETE
Area-P 1/2: 3.42 cm2
S' Lateral: 2 cm

## 2020-05-29 LAB — FIBRINOGEN: Fibrinogen: 112 mg/dL — ABNORMAL LOW (ref 210–475)

## 2020-05-29 LAB — APTT: aPTT: 37 seconds — ABNORMAL HIGH (ref 24–36)

## 2020-05-29 LAB — D-DIMER, QUANTITATIVE: D-Dimer, Quant: >20 ug/mL-FEU — ABNORMAL HIGH (ref 0.00–0.50)

## 2020-05-29 LAB — TROPONIN I (HIGH SENSITIVITY)
Troponin I (High Sensitivity): 31 ng/L — ABNORMAL HIGH (ref <18)
Troponin I (High Sensitivity): 569 ng/L (ref <18)

## 2020-05-29 LAB — GLUCOSE, CAPILLARY
Glucose-Capillary: 150 mg/dL — ABNORMAL HIGH (ref 70–99)
Glucose-Capillary: 159 mg/dL — ABNORMAL HIGH (ref 70–99)

## 2020-05-29 LAB — HEMOGLOBIN AND HEMATOCRIT, BLOOD
HCT: 21.9 % — ABNORMAL LOW (ref 39.0–52.0)
Hemoglobin: 7.2 g/dL — ABNORMAL LOW (ref 13.0–17.0)

## 2020-05-29 LAB — ABO/RH: ABO/RH(D): B POS

## 2020-05-29 SURGERY — REPAIR, AORTIC DISSECTION, ASCENDING
Anesthesia: General

## 2020-05-29 MED ORDER — PANTOPRAZOLE SODIUM 40 MG PO TBEC
40.0000 mg | DELAYED_RELEASE_TABLET | Freq: Every day | ORAL | Status: DC
  Administered 2020-05-31 – 2020-06-06 (×7): 40 mg via ORAL
  Filled 2020-05-29 (×7): qty 1

## 2020-05-29 MED ORDER — NITROGLYCERIN IN D5W 200-5 MCG/ML-% IV SOLN: 0.0000 ug/min | INTRAVENOUS | Status: DC

## 2020-05-29 MED ORDER — SODIUM CHLORIDE 0.9 % IV SOLN
750.0000 mg | INTRAVENOUS | Status: AC
  Administered 2020-05-29: 750 mg via INTRAVENOUS
  Filled 2020-05-29: qty 750

## 2020-05-29 MED ORDER — FENTANYL CITRATE (PF) 250 MCG/5ML IJ SOLN
INTRAMUSCULAR | Status: AC
  Filled 2020-05-29: qty 5

## 2020-05-29 MED ORDER — HEMOSTATIC AGENTS (NO CHARGE) OPTIME
TOPICAL | Status: DC | PRN
  Administered 2020-05-29: 1 via TOPICAL

## 2020-05-29 MED ORDER — ACETAMINOPHEN 500 MG PO TABS
1000.0000 mg | ORAL_TABLET | Freq: Four times a day (QID) | ORAL | Status: DC
  Administered 2020-05-30 – 2020-06-02 (×12): 1000 mg via ORAL
  Filled 2020-05-29 (×12): qty 2

## 2020-05-29 MED ORDER — MIDAZOLAM HCL 5 MG/5ML IJ SOLN
INTRAMUSCULAR | Status: DC | PRN
  Administered 2020-05-29 (×2): 2 mg via INTRAVENOUS
  Administered 2020-05-29 (×2): 1 mg via INTRAVENOUS
  Administered 2020-05-29: 2 mg via INTRAVENOUS

## 2020-05-29 MED ORDER — ROCURONIUM BROMIDE 10 MG/ML (PF) SYRINGE
PREFILLED_SYRINGE | INTRAVENOUS | Status: AC
  Filled 2020-05-29: qty 30

## 2020-05-29 MED ORDER — ALBUMIN HUMAN 5 % IV SOLN: INTRAVENOUS | Status: DC | PRN

## 2020-05-29 MED ORDER — LACTATED RINGERS IV SOLN: INTRAVENOUS | Status: DC

## 2020-05-29 MED ORDER — NOREPINEPHRINE 4 MG/250ML-% IV SOLN
0.0000 ug/min | INTRAVENOUS | Status: AC
  Administered 2020-05-29: 1 ug/min via INTRAVENOUS
  Filled 2020-05-29: qty 250

## 2020-05-29 MED ORDER — HEPARIN SODIUM (PORCINE) 1000 UNIT/ML IJ SOLN
INTRAMUSCULAR | Status: DC | PRN
  Administered 2020-05-29: 3000 [IU] via INTRAVENOUS
  Administered 2020-05-29: 5000 [IU] via INTRAVENOUS
  Administered 2020-05-29: 21000 [IU] via INTRAVENOUS

## 2020-05-29 MED ORDER — SODIUM CHLORIDE 0.9 % IV BOLUS
1000.0000 mL | Freq: Once | INTRAVENOUS | Status: AC
  Administered 2020-05-29: 1000 mL via INTRAVENOUS

## 2020-05-29 MED ORDER — CALCIUM CHLORIDE 10 % IV SOLN
INTRAVENOUS | Status: DC | PRN
  Administered 2020-05-29: 800 mg via INTRAVENOUS
  Administered 2020-05-29: 250 mg via INTRAVENOUS
  Administered 2020-05-29: 300 mg via INTRAVENOUS
  Administered 2020-05-29: 250 mg via INTRAVENOUS
  Administered 2020-05-29: 200 mg via INTRAVENOUS

## 2020-05-29 MED ORDER — ACETAMINOPHEN 160 MG/5ML PO SOLN
1000.0000 mg | Freq: Four times a day (QID) | ORAL | Status: DC
  Administered 2020-05-30: 1000 mg
  Filled 2020-05-29: qty 40.6

## 2020-05-29 MED ORDER — FENTANYL CITRATE (PF) 250 MCG/5ML IJ SOLN
INTRAMUSCULAR | Status: DC | PRN
  Administered 2020-05-29 (×2): 150 ug via INTRAVENOUS
  Administered 2020-05-29 (×2): 100 ug via INTRAVENOUS
  Administered 2020-05-29: 250 ug via INTRAVENOUS
  Administered 2020-05-29: 200 ug via INTRAVENOUS
  Administered 2020-05-29: 50 ug via INTRAVENOUS

## 2020-05-29 MED ORDER — PHENYLEPHRINE HCL-NACL 20-0.9 MG/250ML-% IV SOLN: 0.0000 ug/min | INTRAVENOUS | Status: DC

## 2020-05-29 MED ORDER — MAGNESIUM SULFATE 4 GM/100ML IV SOLN
INTRAVENOUS | Status: AC
  Administered 2020-05-29: 4 g via INTRAVENOUS
  Filled 2020-05-29: qty 100

## 2020-05-29 MED ORDER — POTASSIUM CHLORIDE 10 MEQ/50ML IV SOLN: 10.0000 meq | INTRAVENOUS | Status: AC

## 2020-05-29 MED ORDER — ALBUMIN HUMAN 5 % IV SOLN
250.0000 mL | INTRAVENOUS | Status: AC | PRN
  Administered 2020-05-29 – 2020-05-30 (×3): 12.5 g via INTRAVENOUS
  Filled 2020-05-29 (×2): qty 250

## 2020-05-29 MED ORDER — PLATELET RICH PLASMA OPTIME
Status: DC | PRN
  Administered 2020-05-29: 10 mL

## 2020-05-29 MED ORDER — PHENYLEPHRINE HCL-NACL 20-0.9 MG/250ML-% IV SOLN
30.0000 ug/min | INTRAVENOUS | Status: AC
  Administered 2020-05-29: 50 ug/min via INTRAVENOUS
  Filled 2020-05-29: qty 250

## 2020-05-29 MED ORDER — SODIUM CHLORIDE 0.9% FLUSH
10.0000 mL | Freq: Two times a day (BID) | INTRAVENOUS | Status: DC
  Administered 2020-05-29: 20 mL
  Administered 2020-05-30 – 2020-06-01 (×4): 10 mL

## 2020-05-29 MED ORDER — TRANEXAMIC ACID (OHS) BOLUS VIA INFUSION
15.0000 mg/kg | INTRAVENOUS | Status: AC
  Administered 2020-05-29: 1224 mg via INTRAVENOUS
  Filled 2020-05-29: qty 1224

## 2020-05-29 MED ORDER — MILRINONE LACTATE IN DEXTROSE 20-5 MG/100ML-% IV SOLN
0.3000 ug/kg/min | INTRAVENOUS | Status: DC
  Filled 2020-05-29: qty 100

## 2020-05-29 MED ORDER — NITROGLYCERIN IN D5W 200-5 MCG/ML-% IV SOLN
2.0000 ug/min | INTRAVENOUS | Status: DC
  Filled 2020-05-29: qty 250

## 2020-05-29 MED ORDER — SODIUM CHLORIDE 0.9% IV SOLUTION: Freq: Once | INTRAVENOUS | Status: DC

## 2020-05-29 MED ORDER — FENTANYL CITRATE (PF) 250 MCG/5ML IJ SOLN
INTRAMUSCULAR | Status: AC
  Filled 2020-05-29: qty 10

## 2020-05-29 MED ORDER — ACETAMINOPHEN 160 MG/5ML PO SOLN: 650.0000 mg | Freq: Once | ORAL | Status: AC

## 2020-05-29 MED ORDER — EPINEPHRINE HCL 5 MG/250ML IV SOLN IN NS
0.0000 ug/min | INTRAVENOUS | Status: DC
  Filled 2020-05-29: qty 250

## 2020-05-29 MED ORDER — BISACODYL 10 MG RE SUPP: 10.0000 mg | Freq: Every day | RECTAL | Status: DC

## 2020-05-29 MED ORDER — SODIUM CHLORIDE 0.9% FLUSH: 10.0000 mL | INTRAVENOUS | Status: DC | PRN

## 2020-05-29 MED ORDER — TRANEXAMIC ACID 1000 MG/10ML IV SOLN
1.5000 mg/kg/h | INTRAVENOUS | Status: AC
  Administered 2020-05-29: 1.5 mg/kg/h via INTRAVENOUS
  Filled 2020-05-29: qty 25

## 2020-05-29 MED ORDER — VANCOMYCIN HCL 1000 MG IV SOLR
INTRAVENOUS | Status: AC
  Filled 2020-05-29: qty 3000

## 2020-05-29 MED ORDER — MIDAZOLAM HCL 2 MG/2ML IJ SOLN
INTRAMUSCULAR | Status: AC
  Filled 2020-05-29: qty 2

## 2020-05-29 MED ORDER — ROCURONIUM BROMIDE 10 MG/ML (PF) SYRINGE
PREFILLED_SYRINGE | INTRAVENOUS | Status: DC | PRN
  Administered 2020-05-29: 100 mg via INTRAVENOUS
  Administered 2020-05-29: 20 mg via INTRAVENOUS
  Administered 2020-05-29: 100 mg via INTRAVENOUS

## 2020-05-29 MED ORDER — IOHEXOL 350 MG/ML SOLN
75.0000 mL | Freq: Once | INTRAVENOUS | Status: AC | PRN
  Administered 2020-05-29: 75 mL via INTRAVENOUS

## 2020-05-29 MED ORDER — MANNITOL 20 % IV SOLN
Freq: Once | INTRAVENOUS | Status: DC
  Filled 2020-05-29: qty 13

## 2020-05-29 MED ORDER — PHENYLEPHRINE 40 MCG/ML (10ML) SYRINGE FOR IV PUSH (FOR BLOOD PRESSURE SUPPORT)
PREFILLED_SYRINGE | INTRAVENOUS | Status: AC
  Filled 2020-05-29: qty 10

## 2020-05-29 MED ORDER — FENTANYL CITRATE (PF) 100 MCG/2ML IJ SOLN
INTRAMUSCULAR | Status: AC
  Filled 2020-05-29: qty 2

## 2020-05-29 MED ORDER — TRANEXAMIC ACID (OHS) PUMP PRIME SOLUTION
2.0000 mg/kg | INTRAVENOUS | Status: DC
  Filled 2020-05-29: qty 1.63

## 2020-05-29 MED ORDER — CHLORHEXIDINE GLUCONATE 0.12 % MT SOLN: 15.0000 mL | Freq: Once | OROMUCOSAL | Status: DC

## 2020-05-29 MED ORDER — ACETAMINOPHEN 650 MG RE SUPP
650.0000 mg | Freq: Once | RECTAL | Status: AC
  Administered 2020-05-29: 650 mg via RECTAL

## 2020-05-29 MED ORDER — SODIUM CHLORIDE 0.9% FLUSH
3.0000 mL | Freq: Two times a day (BID) | INTRAVENOUS | Status: DC
  Administered 2020-05-30 – 2020-06-01 (×4): 3 mL via INTRAVENOUS

## 2020-05-29 MED ORDER — DEXMEDETOMIDINE HCL IN NACL 400 MCG/100ML IV SOLN: 0.0000 ug/kg/h | INTRAVENOUS | Status: DC

## 2020-05-29 MED ORDER — FAMOTIDINE IN NACL 20-0.9 MG/50ML-% IV SOLN: 20.0000 mg | Freq: Two times a day (BID) | INTRAVENOUS | Status: DC

## 2020-05-29 MED ORDER — MORPHINE SULFATE (PF) 2 MG/ML IV SOLN
1.0000 mg | INTRAVENOUS | Status: DC | PRN
  Administered 2020-05-30 (×2): 2 mg via INTRAVENOUS
  Filled 2020-05-29 (×2): qty 1

## 2020-05-29 MED ORDER — SODIUM CHLORIDE 0.9 % IV SOLN
1.5000 g | INTRAVENOUS | Status: AC
  Administered 2020-05-29: 1.5 g via INTRAVENOUS
  Filled 2020-05-29: qty 1.5

## 2020-05-29 MED ORDER — CHLORHEXIDINE GLUCONATE CLOTH 2 % EX PADS: 6.0000 | MEDICATED_PAD | Freq: Once | CUTANEOUS | Status: DC

## 2020-05-29 MED ORDER — POTASSIUM CHLORIDE 2 MEQ/ML IV SOLN
80.0000 meq | INTRAVENOUS | Status: DC
  Filled 2020-05-29: qty 40

## 2020-05-29 MED ORDER — ASPIRIN EC 325 MG PO TBEC
325.0000 mg | DELAYED_RELEASE_TABLET | Freq: Once | ORAL | Status: DC
  Filled 2020-05-29: qty 1

## 2020-05-29 MED ORDER — INSULIN REGULAR(HUMAN) IN NACL 100-0.9 UT/100ML-% IV SOLN
INTRAVENOUS | Status: AC
  Administered 2020-05-29: 2.6 [IU]/h via INTRAVENOUS
  Filled 2020-05-29: qty 100

## 2020-05-29 MED ORDER — SODIUM CHLORIDE 0.9 % IV SOLN
1.5000 g | Freq: Two times a day (BID) | INTRAVENOUS | Status: AC
  Administered 2020-05-30 – 2020-05-31 (×4): 1.5 g via INTRAVENOUS
  Filled 2020-05-29 (×4): qty 1.5

## 2020-05-29 MED ORDER — BISACODYL 5 MG PO TBEC
10.0000 mg | DELAYED_RELEASE_TABLET | Freq: Every day | ORAL | Status: DC
  Administered 2020-05-30 – 2020-06-01 (×3): 10 mg via ORAL
  Filled 2020-05-29 (×4): qty 2

## 2020-05-29 MED ORDER — ASPIRIN 81 MG PO CHEW: 324.0000 mg | CHEWABLE_TABLET | Freq: Every day | ORAL | Status: DC

## 2020-05-29 MED ORDER — CHLORHEXIDINE GLUCONATE CLOTH 2 % EX PADS
6.0000 | MEDICATED_PAD | Freq: Every day | CUTANEOUS | Status: DC
  Administered 2020-05-30 – 2020-05-31 (×2): 6 via TOPICAL

## 2020-05-29 MED ORDER — HEMOSTATIC AGENTS (NO CHARGE) OPTIME
TOPICAL | Status: DC | PRN
  Administered 2020-05-29 (×2): 1 via TOPICAL

## 2020-05-29 MED ORDER — ONDANSETRON HCL 4 MG/2ML IJ SOLN
4.0000 mg | Freq: Four times a day (QID) | INTRAMUSCULAR | Status: DC | PRN
  Administered 2020-05-30: 4 mg via INTRAVENOUS
  Filled 2020-05-29: qty 2

## 2020-05-29 MED ORDER — PLATELET POOR PLASMA OPTIME
Status: DC | PRN
  Administered 2020-05-29: 10 mL

## 2020-05-29 MED ORDER — OXYCODONE HCL 5 MG PO TABS
5.0000 mg | ORAL_TABLET | ORAL | Status: DC | PRN
  Administered 2020-05-31: 10 mg via ORAL
  Administered 2020-05-31: 5 mg via ORAL
  Administered 2020-06-01: 10 mg via ORAL
  Administered 2020-06-03: 5 mg via ORAL
  Administered 2020-06-03: 10 mg via ORAL
  Administered 2020-06-04 (×2): 5 mg via ORAL
  Filled 2020-05-29 (×3): qty 1
  Filled 2020-05-29: qty 2
  Filled 2020-05-29: qty 1
  Filled 2020-05-29 (×2): qty 2

## 2020-05-29 MED ORDER — ASPIRIN EC 325 MG PO TBEC
325.0000 mg | DELAYED_RELEASE_TABLET | Freq: Every day | ORAL | Status: DC
  Administered 2020-05-30 – 2020-05-31 (×2): 325 mg via ORAL
  Filled 2020-05-29 (×2): qty 1

## 2020-05-29 MED ORDER — TRAMADOL HCL 50 MG PO TABS
50.0000 mg | ORAL_TABLET | ORAL | Status: DC | PRN
  Administered 2020-05-30: 50 mg via ORAL
  Administered 2020-05-31 – 2020-06-04 (×4): 100 mg via ORAL
  Filled 2020-05-29: qty 1
  Filled 2020-05-29: qty 2
  Filled 2020-05-29: qty 1
  Filled 2020-05-29 (×3): qty 2

## 2020-05-29 MED ORDER — PROPOFOL 10 MG/ML IV BOLUS
INTRAVENOUS | Status: AC
  Filled 2020-05-29: qty 20

## 2020-05-29 MED ORDER — SUCCINYLCHOLINE CHLORIDE 200 MG/10ML IV SOSY
PREFILLED_SYRINGE | INTRAVENOUS | Status: AC
  Filled 2020-05-29: qty 10

## 2020-05-29 MED ORDER — SUCCINYLCHOLINE CHLORIDE 20 MG/ML IJ SOLN
INTRAMUSCULAR | Status: DC | PRN
  Administered 2020-05-29: 120 mg via INTRAVENOUS

## 2020-05-29 MED ORDER — PLASMA-LYTE 148 IV SOLN
INTRAVENOUS | Status: DC | PRN
  Administered 2020-05-29: 500 mL via INTRAVASCULAR

## 2020-05-29 MED ORDER — STERILE WATER FOR IRRIGATION IR SOLN
Status: DC | PRN
  Administered 2020-05-29: 2000 mL

## 2020-05-29 MED ORDER — PROPOFOL 10 MG/ML IV BOLUS
INTRAVENOUS | Status: DC | PRN
  Administered 2020-05-29: 100 mg via INTRAVENOUS

## 2020-05-29 MED ORDER — BISACODYL 5 MG PO TBEC
5.0000 mg | DELAYED_RELEASE_TABLET | Freq: Once | ORAL | Status: DC
  Filled 2020-05-29: qty 1

## 2020-05-29 MED ORDER — LACTATED RINGERS IV SOLN: 500.0000 mL | Freq: Once | INTRAVENOUS | Status: DC | PRN

## 2020-05-29 MED ORDER — ORAL CARE MOUTH RINSE
15.0000 mL | OROMUCOSAL | Status: DC
  Administered 2020-05-30 (×4): 15 mL via OROMUCOSAL

## 2020-05-29 MED ORDER — CHLORHEXIDINE GLUCONATE 0.12 % MT SOLN: 15.0000 mL | OROMUCOSAL | Status: AC

## 2020-05-29 MED ORDER — SODIUM CHLORIDE 0.45 % IV SOLN: INTRAVENOUS | Status: DC | PRN

## 2020-05-29 MED ORDER — CHLORHEXIDINE GLUCONATE 0.12% ORAL RINSE (MEDLINE KIT)
15.0000 mL | Freq: Two times a day (BID) | OROMUCOSAL | Status: DC
  Administered 2020-05-29 – 2020-05-30 (×2): 15 mL via OROMUCOSAL

## 2020-05-29 MED ORDER — ASPIRIN 81 MG PO CHEW
162.0000 mg | CHEWABLE_TABLET | Freq: Once | ORAL | Status: DC
  Filled 2020-05-29: qty 2

## 2020-05-29 MED ORDER — SODIUM CHLORIDE 0.9% FLUSH: 3.0000 mL | INTRAVENOUS | Status: DC | PRN

## 2020-05-29 MED ORDER — SODIUM CHLORIDE 0.9 % IV SOLN
INTRAVENOUS | Status: DC
  Filled 2020-05-29: qty 30

## 2020-05-29 MED ORDER — THROMBIN 5000 UNITS EX SOLR
INTRAVENOUS | Status: DC | PRN
  Administered 2020-05-29: 2 mL

## 2020-05-29 MED ORDER — MIDAZOLAM HCL 2 MG/2ML IJ SOLN
2.0000 mg | INTRAMUSCULAR | Status: DC | PRN
  Administered 2020-05-29 – 2020-05-30 (×3): 2 mg via INTRAVENOUS
  Filled 2020-05-29 (×3): qty 2

## 2020-05-29 MED ORDER — ORAL CARE MOUTH RINSE: 15.0000 mL | Freq: Once | OROMUCOSAL | Status: DC

## 2020-05-29 MED ORDER — ETOMIDATE 2 MG/ML IV SOLN
INTRAVENOUS | Status: DC | PRN
  Administered 2020-05-29: 12 mg via INTRAVENOUS

## 2020-05-29 MED ORDER — NOREPINEPHRINE 4 MG/250ML-% IV SOLN
0.0000 ug/min | INTRAVENOUS | Status: DC
  Administered 2020-05-30: 4 ug/min via INTRAVENOUS
  Filled 2020-05-29: qty 250

## 2020-05-29 MED ORDER — VANCOMYCIN HCL 1000 MG IV SOLR
INTRAVENOUS | Status: DC | PRN
  Administered 2020-05-29: 3 g

## 2020-05-29 MED ORDER — VANCOMYCIN HCL 1250 MG/250ML IV SOLN
1250.0000 mg | INTRAVENOUS | Status: DC
  Filled 2020-05-29: qty 250

## 2020-05-29 MED ORDER — 0.9 % SODIUM CHLORIDE (POUR BTL) OPTIME
TOPICAL | Status: DC | PRN
  Administered 2020-05-29: 5000 mL

## 2020-05-29 MED ORDER — METOPROLOL TARTRATE 5 MG/5ML IV SOLN
2.5000 mg | INTRAVENOUS | Status: DC | PRN
  Administered 2020-06-02: 5 mg via INTRAVENOUS
  Filled 2020-05-29: qty 5

## 2020-05-29 MED ORDER — METOPROLOL TARTRATE 25 MG/10 ML ORAL SUSPENSION
12.5000 mg | Freq: Two times a day (BID) | ORAL | Status: DC
  Filled 2020-05-29 (×2): qty 5

## 2020-05-29 MED ORDER — OXCARBAZEPINE 300 MG PO TABS
600.0000 mg | ORAL_TABLET | Freq: Two times a day (BID) | ORAL | Status: DC
  Administered 2020-05-30 – 2020-06-06 (×15): 600 mg via ORAL
  Filled 2020-05-29 (×17): qty 2

## 2020-05-29 MED ORDER — CHLORHEXIDINE GLUCONATE 0.12 % MT SOLN
OROMUCOSAL | Status: AC
  Filled 2020-05-29: qty 15

## 2020-05-29 MED ORDER — VANCOMYCIN HCL IN DEXTROSE 1-5 GM/200ML-% IV SOLN
1000.0000 mg | Freq: Once | INTRAVENOUS | Status: AC
  Administered 2020-05-30: 1000 mg via INTRAVENOUS
  Filled 2020-05-29: qty 200

## 2020-05-29 MED ORDER — SODIUM CHLORIDE (PF) 0.9 % IJ SOLN
OROMUCOSAL | Status: DC | PRN
  Administered 2020-05-29: 4 mL via TOPICAL

## 2020-05-29 MED ORDER — PLASMA-LYTE 148 IV SOLN
INTRAVENOUS | Status: DC
  Filled 2020-05-29: qty 2.5

## 2020-05-29 MED ORDER — DOCUSATE SODIUM 100 MG PO CAPS
200.0000 mg | ORAL_CAPSULE | Freq: Every day | ORAL | Status: DC
  Administered 2020-05-30 – 2020-06-03 (×5): 200 mg via ORAL
  Filled 2020-05-29 (×7): qty 2

## 2020-05-29 MED ORDER — METOPROLOL TARTRATE 12.5 MG HALF TABLET: 12.5000 mg | ORAL_TABLET | Freq: Once | ORAL | Status: DC

## 2020-05-29 MED ORDER — ASPIRIN 81 MG PO CHEW
243.0000 mg | CHEWABLE_TABLET | Freq: Once | ORAL | Status: AC
  Administered 2020-05-29: 243 mg via ORAL

## 2020-05-29 MED ORDER — DEXMEDETOMIDINE HCL IN NACL 400 MCG/100ML IV SOLN
0.1000 ug/kg/h | INTRAVENOUS | Status: DC
  Filled 2020-05-29 (×2): qty 100

## 2020-05-29 MED ORDER — SODIUM CHLORIDE 0.9 % IV SOLN: INTRAVENOUS | Status: DC

## 2020-05-29 MED ORDER — CHLORHEXIDINE GLUCONATE CLOTH 2 % EX PADS
6.0000 | MEDICATED_PAD | Freq: Every day | CUTANEOUS | Status: DC
  Administered 2020-06-02 – 2020-06-05 (×4): 6 via TOPICAL

## 2020-05-29 MED ORDER — LACTATED RINGERS IV SOLN: INTRAVENOUS | Status: DC | PRN

## 2020-05-29 MED ORDER — EPHEDRINE SULFATE 50 MG/ML IJ SOLN
INTRAMUSCULAR | Status: DC | PRN
  Administered 2020-05-29: 5 mg via INTRAVENOUS

## 2020-05-29 MED ORDER — ARTIFICIAL TEARS OPHTHALMIC OINT
TOPICAL_OINTMENT | OPHTHALMIC | Status: AC
  Filled 2020-05-29: qty 3.5

## 2020-05-29 MED ORDER — DEXMEDETOMIDINE HCL IN NACL 400 MCG/100ML IV SOLN
INTRAVENOUS | Status: DC | PRN
  Administered 2020-05-29: .4 ug/kg/h via INTRAVENOUS

## 2020-05-29 MED ORDER — ONDANSETRON HCL 4 MG/2ML IJ SOLN
INTRAMUSCULAR | Status: AC
  Filled 2020-05-29: qty 2

## 2020-05-29 MED ORDER — FAMOTIDINE IN NACL 20-0.9 MG/50ML-% IV SOLN
INTRAVENOUS | Status: AC
  Filled 2020-05-29: qty 50

## 2020-05-29 MED ORDER — DEXTROSE 50 % IV SOLN: 0.0000 mL | INTRAVENOUS | Status: DC | PRN

## 2020-05-29 MED ORDER — MIDAZOLAM HCL (PF) 10 MG/2ML IJ SOLN
INTRAMUSCULAR | Status: AC
  Filled 2020-05-29: qty 2

## 2020-05-29 MED ORDER — METOPROLOL TARTRATE 12.5 MG HALF TABLET
12.5000 mg | ORAL_TABLET | Freq: Two times a day (BID) | ORAL | Status: DC
  Administered 2020-05-30 – 2020-06-02 (×6): 12.5 mg via ORAL
  Filled 2020-05-29 (×6): qty 1

## 2020-05-29 MED ORDER — SODIUM CHLORIDE 0.9 % IV SOLN: 250.0000 mL | INTRAVENOUS | Status: DC

## 2020-05-29 MED ORDER — INSULIN REGULAR(HUMAN) IN NACL 100-0.9 UT/100ML-% IV SOLN: INTRAVENOUS | Status: DC

## 2020-05-29 MED ORDER — MAGNESIUM SULFATE 4 GM/100ML IV SOLN: 4.0000 g | Freq: Once | INTRAVENOUS | Status: AC

## 2020-05-29 MED ORDER — TEMAZEPAM 15 MG PO CAPS: 15.0000 mg | ORAL_CAPSULE | Freq: Once | ORAL | Status: DC | PRN

## 2020-05-29 MED ORDER — PROTAMINE SULFATE 10 MG/ML IV SOLN
INTRAVENOUS | Status: DC | PRN
  Administered 2020-05-29: 10 mg via INTRAVENOUS

## 2020-05-29 MED ORDER — EPINEPHRINE HCL 5 MG/250ML IV SOLN IN NS: 2.0000 ug/min | INTRAVENOUS | Status: DC

## 2020-05-29 MED ORDER — HYDROCORTISONE NA SUCCINATE PF 100 MG IJ SOLR
INTRAMUSCULAR | Status: DC | PRN
  Administered 2020-05-29: 125 mg via INTRAVENOUS

## 2020-05-29 MED ORDER — EPHEDRINE 5 MG/ML INJ
INTRAVENOUS | Status: AC
  Filled 2020-05-29: qty 10

## 2020-05-29 MED ORDER — COAGULATION FACTOR VIIA RECOMB 5 MG IV SOLR
5.0000 mg | Freq: Once | INTRAVENOUS | Status: AC
  Administered 2020-05-29: 5 mg via INTRAVENOUS
  Filled 2020-05-29: qty 5

## 2020-05-29 MED ORDER — ETOMIDATE 2 MG/ML IV SOLN
INTRAVENOUS | Status: AC
  Filled 2020-05-29: qty 10

## 2020-05-29 SURGICAL SUPPLY — 130 items
ADAPTER CARDIO PERF ANTE/RETRO (ADAPTER) ×2 IMPLANT
ADAPTER MULTI PERFUSION 15 (ADAPTER) ×1 IMPLANT
ADH SKN CLS APL DERMABOND .7 (GAUZE/BANDAGES/DRESSINGS) ×1
ADH SRG 12 PREFL SYR 3 SPRDR (MISCELLANEOUS) ×1
ADPR PRFSN 84XANTGRD RTRGD (ADAPTER) ×1
APL SRG 7X2 LUM MLBL SLNT (VASCULAR PRODUCTS)
APPLICATOR TIP COSEAL (VASCULAR PRODUCTS) IMPLANT
APPLICATOR TIP STD SYR BGAT-SY (MISCELLANEOUS) ×1 IMPLANT
BAG DECANTER FOR FLEXI CONT (MISCELLANEOUS) ×2 IMPLANT
BANDAGE HEMOSTAT MRDH 4X4 STRL (MISCELLANEOUS) IMPLANT
BLADE CLIPPER SURG (BLADE) ×2 IMPLANT
BLADE STERNUM SYSTEM 6 (BLADE) ×2 IMPLANT
BLADE SURG 15 STRL LF DISP TIS (BLADE) ×1 IMPLANT
BLADE SURG 15 STRL SS (BLADE) ×4
BNDG HEMOSTAT MRDH 4X4 STRL (MISCELLANEOUS) ×2
CANISTER SUCT 3000ML PPV (MISCELLANEOUS) ×2 IMPLANT
CANNULA AORTIC ROOT 9FR (CANNULA) ×1 IMPLANT
CANNULA OPTISITE PERFUSION 22F (CANNULA) ×1 IMPLANT
CANNULA SUMP PERICARDIAL (CANNULA) ×1 IMPLANT
CATH HEART VENT LEFT (CATHETERS) ×1 IMPLANT
CATH RETROPLEGIA CORONARY 14FR (CATHETERS) ×2 IMPLANT
CATH ROBINSON RED A/P 18FR (CATHETERS) ×5 IMPLANT
CAUTERY HIGH TEMP VAS (MISCELLANEOUS) ×2 IMPLANT
CLIP VESOCCLUDE LG 6/CT (CLIP) ×1 IMPLANT
CLIP VESOCCLUDE SM WIDE 24/CT (CLIP) ×1 IMPLANT
CNTNR URN SCR LID CUP LEK RST (MISCELLANEOUS) ×1 IMPLANT
CONN ST 1/4X3/8  BEN (MISCELLANEOUS) ×6
CONN ST 1/4X3/8 BEN (MISCELLANEOUS) IMPLANT
CONT SPEC 4OZ STRL OR WHT (MISCELLANEOUS) ×4
DERMABOND ADVANCED (GAUZE/BANDAGES/DRESSINGS) ×1
DERMABOND ADVANCED .7 DNX12 (GAUZE/BANDAGES/DRESSINGS) IMPLANT
DRAIN CHANNEL 28F RND 3/8 FF (WOUND CARE) ×4 IMPLANT
DRSG AQUACEL AG ADV 3.5X 6 (GAUZE/BANDAGES/DRESSINGS) ×1 IMPLANT
DRSG AQUACEL AG ADV 3.5X14 (GAUZE/BANDAGES/DRESSINGS) ×1 IMPLANT
DRSG COVADERM 4X14 (GAUZE/BANDAGES/DRESSINGS) ×1 IMPLANT
ELECT BLADE 4.0 EZ CLEAN MEGAD (MISCELLANEOUS) ×2
ELECT CAUTERY BLADE 6.4 (BLADE) ×2 IMPLANT
ELECT REM PT RETURN 9FT ADLT (ELECTROSURGICAL) ×4
ELECTRODE BLDE 4.0 EZ CLN MEGD (MISCELLANEOUS) IMPLANT
ELECTRODE REM PT RTRN 9FT ADLT (ELECTROSURGICAL) ×2 IMPLANT
FELT TEFLON 1X6 (MISCELLANEOUS) ×2 IMPLANT
FELT TEFLON 6X6 (MISCELLANEOUS) ×1 IMPLANT
GAUZE SPONGE 4X4 12PLY STRL (GAUZE/BANDAGES/DRESSINGS) ×2 IMPLANT
GAUZE SPONGE 4X4 12PLY STRL LF (GAUZE/BANDAGES/DRESSINGS) ×1 IMPLANT
GLOVE BIO SURGEON STRL SZ 6 (GLOVE) ×1 IMPLANT
GLOVE BIO SURGEON STRL SZ 6.5 (GLOVE) ×2 IMPLANT
GLOVE BIO SURGEON STRL SZ7 (GLOVE) IMPLANT
GLOVE BIO SURGEON STRL SZ7.5 (GLOVE) ×1 IMPLANT
GLOVE BIOGEL PI IND STRL 6.5 (GLOVE) IMPLANT
GLOVE BIOGEL PI INDICATOR 6.5 (GLOVE) ×4
GLOVE NEODERM STRL 7.5  LF PF (GLOVE) ×3
GLOVE NEODERM STRL 7.5 LF PF (GLOVE) ×2 IMPLANT
GLOVE SURG NEODERM 7.5  LF PF (GLOVE) ×3
GOWN STRL REUS W/ TWL LRG LVL3 (GOWN DISPOSABLE) ×4 IMPLANT
GOWN STRL REUS W/TWL LRG LVL3 (GOWN DISPOSABLE) ×10
GRAFT HEMASHIELD 10MM (Graft) ×2 IMPLANT
GRAFT VASC STRG 30X10STRL (Graft) IMPLANT
GRAFT WOVEN D/V 32DX30L (Vascular Products) ×1 IMPLANT
HANDLE STAPLE ENDO GIA SHORT (STAPLE) ×1
HEMOSTAT POWDER SURGIFOAM 1G (HEMOSTASIS) ×5 IMPLANT
HEMOSTAT SNOW SURGICEL 2X4 (HEMOSTASIS) ×1 IMPLANT
HEMOSTAT SURGICEL 2X14 (HEMOSTASIS) ×2 IMPLANT
HEMOSTAT SURGICEL 2X4 FIBR (HEMOSTASIS) ×2 IMPLANT
INSERT FOGARTY SM (MISCELLANEOUS) ×1 IMPLANT
INSERT FOGARTY XLG (MISCELLANEOUS) ×1 IMPLANT
INSERT SUTURE HOLDER (MISCELLANEOUS) ×2 IMPLANT
KIT APPLICATOR RATIO 11:1 (KITS) ×1 IMPLANT
KIT BASIN OR (CUSTOM PROCEDURE TRAY) ×2 IMPLANT
KIT DRAINAGE VACCUM ASSIST (KITS) ×1 IMPLANT
KIT SUCTION CATH 14FR (SUCTIONS) ×2 IMPLANT
KIT TURNOVER KIT B (KITS) ×2 IMPLANT
LEAD PACING MYOCARDI (MISCELLANEOUS) ×1 IMPLANT
LINE VENT (MISCELLANEOUS) ×1 IMPLANT
NS IRRIG 1000ML POUR BTL (IV SOLUTION) ×10 IMPLANT
PACK E OPEN HEART (SUTURE) ×2 IMPLANT
PACK OPEN HEART (CUSTOM PROCEDURE TRAY) ×2 IMPLANT
PACK PLATELET PROCEDURE 60 (MISCELLANEOUS) ×1 IMPLANT
PAD ARMBOARD 7.5X6 YLW CONV (MISCELLANEOUS) ×3 IMPLANT
POSITIONER HEAD DONUT 9IN (MISCELLANEOUS) ×2 IMPLANT
POWDER SURGICEL 3.0 GRAM (HEMOSTASIS) ×3 IMPLANT
RELOAD TRI 2.0 30 VAS MED SUL (STAPLE) ×1 IMPLANT
SEALANT SURG COSEAL 8ML (VASCULAR PRODUCTS) ×2 IMPLANT
SET CARDIOPLEGIA MPS 5001102 (MISCELLANEOUS) ×1 IMPLANT
SPONGE LAP 18X18 RF (DISPOSABLE) ×2 IMPLANT
SPONGE LAP 4X18 RFD (DISPOSABLE) ×5 IMPLANT
STAPLER ENDO GIA 12 SHRT THIN (STAPLE) IMPLANT
STAPLER ENDO GIA 12MM SHORT (STAPLE) ×1 IMPLANT
SUT BONE WAX W31G (SUTURE) ×2 IMPLANT
SUT ETHIBON 2 0 V 52N 30 (SUTURE) ×4 IMPLANT
SUT ETHIBON EXCEL 2-0 V-5 (SUTURE) IMPLANT
SUT ETHIBOND V-5 VALVE (SUTURE) IMPLANT
SUT ETHIBOND X763 2 0 SH 1 (SUTURE) ×1 IMPLANT
SUT MNCRL AB 3-0 PS2 18 (SUTURE) ×3 IMPLANT
SUT PROLENE 2 0 SH DA (SUTURE) ×1 IMPLANT
SUT PROLENE 3 0 SH 1 (SUTURE) ×2 IMPLANT
SUT PROLENE 3 0 SH 48 (SUTURE) ×6 IMPLANT
SUT PROLENE 3 0 SH DA (SUTURE) ×7 IMPLANT
SUT PROLENE 4 0 RB 1 (SUTURE) ×10
SUT PROLENE 4 0 SH DA (SUTURE) ×8 IMPLANT
SUT PROLENE 4-0 RB1 .5 CRCL 36 (SUTURE) ×4 IMPLANT
SUT PROLENE 5 0 C 1 36 (SUTURE) ×5 IMPLANT
SUT PROLENE 6 0 C 1 30 (SUTURE) ×2 IMPLANT
SUT SILK  1 MH (SUTURE) ×4
SUT SILK 1 MH (SUTURE) IMPLANT
SUT STEEL 6MS V (SUTURE) ×1 IMPLANT
SUT STEEL STERNAL CCS#1 18IN (SUTURE) IMPLANT
SUT STEEL SZ 6 DBL 3X14 BALL (SUTURE) ×1 IMPLANT
SUT VIC AB 1 CTX 36 (SUTURE) ×4
SUT VIC AB 1 CTX36XBRD ANBCTR (SUTURE) ×2 IMPLANT
SUT VIC AB 2-0 CT1 27 (SUTURE) ×2
SUT VIC AB 2-0 CT1 TAPERPNT 27 (SUTURE) IMPLANT
SUT VIC AB 2-0 CTX 27 (SUTURE) ×1 IMPLANT
SUT VIC AB 3-0 X1 27 (SUTURE) IMPLANT
SYR 10ML KIT SKIN ADHESIVE (MISCELLANEOUS) ×1 IMPLANT
SYR BULB IRRIG 60ML STRL (SYRINGE) ×2 IMPLANT
SYSTEM SAHARA CHEST DRAIN ATS (WOUND CARE) ×2 IMPLANT
TAPE CLOTH SURG 4X10 WHT LF (GAUZE/BANDAGES/DRESSINGS) ×1 IMPLANT
TAPE PAPER 2X10 WHT MICROPORE (GAUZE/BANDAGES/DRESSINGS) ×1 IMPLANT
TIP DUAL SPRAY TOPICAL (TIP) ×2 IMPLANT
TOWEL GREEN STERILE (TOWEL DISPOSABLE) ×2 IMPLANT
TOWEL GREEN STERILE FF (TOWEL DISPOSABLE) ×2 IMPLANT
TRAY CATH LUMEN 1 20CM STRL (SET/KITS/TRAYS/PACK) ×1 IMPLANT
TRAY FOLEY SLVR 14FR TEMP STAT (SET/KITS/TRAYS/PACK) ×1 IMPLANT
TUBE CONNECTING 20X1/4 (TUBING) ×2 IMPLANT
TUBE SUCT INTRACARD DLP 20F (MISCELLANEOUS) ×1 IMPLANT
TUBING ART PRESS 72  MALE/FEM (TUBING) ×4
TUBING ART PRESS 72 MALE/FEM (TUBING) IMPLANT
UNDERPAD 30X36 HEAVY ABSORB (UNDERPADS AND DIAPERS) ×2 IMPLANT
VENT LEFT HEART 12002 (CATHETERS) ×2
WATER STERILE IRR 1000ML POUR (IV SOLUTION) ×4 IMPLANT

## 2020-05-29 NOTE — Progress Notes (Signed)
Received patient from OR. Increased rate to 14 per CRNA.

## 2020-05-29 NOTE — ED Triage Notes (Signed)
Pt BIBA for substernal chest pain heaviness and a witnessed syncopal episode at the gym. Pt has complaints of chest pain with some nausea. Denies radiation. Resp even and unlabored. Skin color normal and dry. Pt had ST depressionin V5 and V6 leads for EMS. Marland Kitchen

## 2020-05-29 NOTE — ED Notes (Signed)
Pt's O2 saturation dropped to 85% on RA. Placed patient on 3L [place] with an O2 saturation of 94%. Cardiology at bedside and aware of BP.

## 2020-05-29 NOTE — Progress Notes (Signed)
Echocardiogram 2D Echocardiogram has been performed.  Oneal Deputy Kaila Devries 05/29/2020, 12:57 PM

## 2020-05-29 NOTE — ED Notes (Signed)
MD aware of BP. Pt resting comfortably in bed and in NAD. Wife at bedside.

## 2020-05-29 NOTE — Anesthesia Procedure Notes (Signed)
Central Venous Catheter Insertion Performed by: Belinda Block, MD, anesthesiologist Start/End11/02/2020 1:45 PM, 05/29/2020 2:00 PM Patient location: Pre-op. Preanesthetic checklist: patient identified, IV checked, site marked, risks and benefits discussed, surgical consent, monitors and equipment checked, pre-op evaluation, timeout performed and anesthesia consent Position: Trendelenburg Lidocaine 1% used for infiltration and patient sedated Hand hygiene performed , maximum sterile barriers used  and Seldinger technique used Catheter size: 8.5 Fr PA cath was placed.MAC introducer Swan type:thermodilution Procedure performed using ultrasound guided technique. Ultrasound Notes:anatomy identified, needle tip was noted to be adjacent to the nerve/plexus identified, no ultrasound evidence of intravascular and/or intraneural injection and image(s) printed for medical record Attempts: 1 Following insertion, line sutured, dressing applied and Biopatch. Post procedure assessment: blood return through all ports and free fluid flow  Patient tolerated the procedure well with no immediate complications.

## 2020-05-29 NOTE — CV Procedure (Addendum)
Echocardiogram 05/29/2020: 1. Small sized left ventricle.  No wall motion abnormality.  EF 55 to 60%.  Mild LVH.  Grade 1 diastolic dysfunction. 2.  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 3.  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  No aortic regurgitation. 4.  No other significant valvular abnormality. 5.  No pericardial effusion.  I am unable to read the echocardiogram on the syngo dynamics portal, Dr. Orvan Seen aware of the results, Dr. Valentina Lucks was present during imaging as well.   Adrian Prows, MD, [facility] 05/29/2020, 1:16 PM Office: [phone] Pager: [phone]

## 2020-05-29 NOTE — ED Provider Notes (Signed)
[facility] EMERGENCY DEPARTMENT Provider Note   CSN: [id number] Arrival date & time: 05/29/20  0827     History No chief complaint on file.   Juan Wagner is a 69 y.o. male.  The history is provided by the patient and the EMS personnel.  Loss of Consciousness Episode history:  Single Most recent episode:  Today Timing:  Constant Progression:  Resolved Chronicity:  New Context: exertion   Witnessed: yes   Relieved by:  Lying down Associated symptoms: chest pain, nausea and vomiting   Associated symptoms: no confusion, no diaphoresis, no dizziness, no fever, no headaches, no palpitations and no shortness of breath   Chest pain:    Quality: pressure     Severity:  Moderate   Onset quality:  Sudden   Timing:  Constant   Progression:  Unchanged   Chronicity:  New Risk factors: seizures   Chest Pain Associated symptoms: nausea, numbness, syncope and vomiting   Associated symptoms: no abdominal pain, no cough, no diaphoresis, no dizziness, no fever, no headache, no palpitations and no shortness of breath     HPI: A 69 year old patient with a history of hypertension and hypercholesterolemia presents for evaluation of chest pain. Initial onset of pain was less than one hour ago. The patient's chest pain is described as heaviness/pressure/tightness and is not worse with exertion. The patient complains of nausea. The patient's chest pain is middle- or left-sided, is not well-localized, is not sharp and does not radiate to the arms/jaw/neck. The patient denies diaphoresis. The patient has no history of stroke, has no history of peripheral artery disease, has not smoked in the past 90 days, denies any history of treated diabetes, has no relevant family history of coronary artery disease (first degree relative at less than age 24) and does not have an elevated BMI (>=30).   Past Medical History:  Diagnosis Date  . ED (erectile dysfunction)   . Hyperlipidemia     2018 was 184- past hx   . Hypertension   . Seizures ([place])    1st event 03/2011- no seizure in years as of 2019  . Sleep apnea    no CPAP use as of 2019    Patient Active Problem List   Diagnosis Date Noted  . Healthcare maintenance 02/04/2017  . Advance care planning 12/09/2014  . Sleep apnea syndrome 11/28/2014  . Medicare annual wellness visit, subsequent 01/07/2012  . Seizure ([place]) 04/01/2011  . 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 ERECTILE DYSFUNCTION 02/11/2008  . HLD (hyperlipidemia) 01/29/2007  . Essential hypertension 01/29/2007  . 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 COLONOSCOPY    . KNEE SURGERY     scope L knee  . POLYPECTOMY    . SEPTOPLASTY     broken nose       Family History  Problem Relation Age of Onset  . Hypertension Mother   . Cancer Mother        ovarian CAchemo 2010 remission  . Breast cancer Mother   . Ovarian cancer Mother   . Alcohol abuse Father   . Cancer Father   . Throat cancer Father   . Esophageal cancer Father   . Cancer Sister        pancreatic CA Stage IV,mets to lungs  . Pancreatic cancer Sister   . Cirrhosis Brother        smoke and drugs  . Cancer  Brother        lung cancer  . Lung cancer Brother   . Hypertension Maternal Grandmother   . Hypertension Maternal Grandfather   . Prostate cancer Neg Hx   . Colon cancer Neg Hx   . Colon polyps Neg Hx   . Rectal cancer Neg Hx   . Stomach cancer Neg Hx     Social History   Tobacco Use  . Smoking status: Never Smoker  . Smokeless tobacco: Never Used  Vaping Use  . Vaping Use: Never used  Substance Use Topics  . Alcohol use: No    Alcohol/week: 0.0 standard drinks  . Drug use: No    Home Medications Prior to Admission medications   Medication Sig Start Date End Date Taking? Authorizing Provider  amLODipine (NORVASC) 10 MG tablet Take 1 tablet (10 mg total) by mouth  daily. 02/18/20   Tonia Ghent, MD  aspirin 81 MG tablet Take 81 mg by mouth daily.      [provider]  atorvastatin (LIPITOR) 10 MG tablet Take 1 tablet (10 mg total) by mouth daily. 02/18/20   Tonia Ghent, MD  hydrochlorothiazide (HYDRODIURIL) 12.5 MG tablet Take 1 tablet (12.5 mg total) by mouth daily. 02/18/20   Tonia Ghent, MD  Multiple Vitamin (MULTIVITAMIN) tablet Take 1 tablet by mouth daily.      [provider]  oxcarbazepine (TRILEPTAL) 600 MG tablet Take 600 mg by mouth 2 (two) times daily.    [provider]    Allergies    Lisinopril, Prevnar [pneumococcal 13-val conj vacc], and Viagra [sildenafil citrate]  Review of Systems   Review of Systems  Constitutional: Negative for diaphoresis and fever.  HENT: Negative for congestion and sore throat.   Eyes: Negative for visual disturbance.  Respiratory: Negative for cough and shortness of breath.   Cardiovascular: Positive for chest pain and syncope. Negative for palpitations and leg swelling.  Gastrointestinal: Positive for nausea and vomiting. Negative for abdominal pain and diarrhea.  Skin: Negative for color change and rash.  Neurological: Positive for numbness. Negative for dizziness and headaches.  Psychiatric/Behavioral: Negative for confusion.  All other systems reviewed and are negative.   Physical Exam Updated Vital Signs BP 118/65 (BP Location: Left Arm)   Pulse 66   Temp 97.6 F (36.4 C) (Oral)   Resp 20   SpO2 95%   Physical Exam Vitals reviewed.  Constitutional:      General: He is not in acute distress.    Appearance: Normal appearance.  HENT:     Head: Normocephalic and atraumatic.     Nose: Nose normal.     Mouth/Throat:     Mouth: Mucous membranes are moist.     Pharynx: Oropharynx is clear.  Eyes:     Conjunctiva/sclera: Conjunctivae normal.  Cardiovascular:     Rate and Rhythm: Normal rate.     Pulses: Normal pulses.     Heart sounds: Normal heart  sounds.  Pulmonary:     Effort: Pulmonary effort is normal. No respiratory distress.     Breath sounds: Normal breath sounds. No rhonchi or rales.  Abdominal:     General: Abdomen is flat.     Palpations: Abdomen is soft.     Tenderness: There is no abdominal tenderness.  Musculoskeletal:     Cervical back: Neck supple.     Right lower leg: No edema.     Left lower leg: No edema.  Skin:    General:  Skin is warm and dry.  Neurological:     Mental Status: He is alert.  Psychiatric:        Mood and Affect: Mood normal.        Behavior: Behavior normal.     ED Results / Procedures / Treatments   Labs (all labs ordered are listed, but only abnormal results are displayed) Labs Reviewed  CBC WITH DIFFERENTIAL/PLATELET - Abnormal; Notable for the following components:      Result Value   Hemoglobin 12.8 (*)    All other components within normal limits  COMPREHENSIVE METABOLIC PANEL - Abnormal; Notable for the following components:   Potassium 3.4 (*)    Glucose, Bld 194 (*)    Creatinine, Ser 1.37 (*)    GFR, Estimated 55 (*)    All other components within normal limits  D-DIMER, QUANTITATIVE (NOT AT [facility]) - Abnormal; Notable for the following components:   D-Dimer, Quant >20.00 (*)    All other components within normal limits  CBG MONITORING, ED - Abnormal; Notable for the following components:   Glucose-Capillary 187 (*)    All other components within normal limits  TROPONIN I (HIGH SENSITIVITY) - Abnormal; Notable for the following components:   Troponin I (High Sensitivity) 31 (*)    All other components within normal limits  TROPONIN I (HIGH SENSITIVITY) - Abnormal; Notable for the following components:   Troponin I (High Sensitivity) 569 (*)    All other components within normal limits  RESPIRATORY PANEL BY RT PCR (FLU A&B, COVID)  POC OCCULT BLOOD, ED  TYPE AND SCREEN  ABO/RH    EKG EKG Interpretation  Date/Time:  Monday May 29 2020 09:19:35  EST Ventricular Rate:  68 PR Interval:    QRS Duration: 119 QT Interval:  379 QTC Calculation: 403 R Axis:   -65 Text Interpretation: Sinus rhythm Prolonged PR interval Left anterior fascicular block Left ventricular hypertrophy `mild st elev v2-v3, with t inv v3-v6 - ?ischemia/ACS vs LVH w repol abn - STEMI MD consulted Confirmed by Lajean Saver [id number]) on 05/29/2020 9:37:32 AM   Radiology CT Angio Chest PE W and/or Wo Contrast  Result Date: 05/29/2020 CLINICAL DATA:  Substernal chest pain and recent syncopal episode EXAM: CT ANGIOGRAPHY CHEST WITH CONTRAST TECHNIQUE: Multidetector CT imaging of the chest was performed using the standard protocol during bolus administration of intravenous contrast. Multiplanar CT image reconstructions and MIPs were obtained to evaluate the vascular anatomy. CONTRAST:  30mL OMNIPAQUE IOHEXOL 350 MG/ML SOLN COMPARISON:  Chest x-ray from earlier in the same day. FINDINGS: Cardiovascular: There are changes consistent with a type a dissection involving the ascending aorta (originating just above the sino-tubular junction) and extending into descending aorta at least to its mid thoracic level. The timing of the contrast bolus for evaluation of the pulmonary artery somewhat limits the more distal evaluation of aorta but the dissection flap extends at least to just below the level of the carina. The ascending aorta measures 4.1 cm in greatest dimension. There is significant compromise of the true lumen in the distal aspect of the ascending aorta related to compression by the false lumen. The dissection extends into the level of the right innominate vein and into the origin of the left subclavian artery. Evaluation of the left common carotid artery is limited due to scatter from the contrast bolus in the left subclavian artery. The pulmonary artery is significantly attenuated by mass effect from the aortic hematoma in the left lateral aspect of the ascending  aorta. Delayed  images at the beginning of the CT of the abdomen and pelvis reveal opacification of this aortic hematoma although no contrast extends into the pericardial sac. No filling defect to suggest pulmonary embolism is identified however. No significant coronary calcifications are noted. No significant pericardial effusion is seen. Coronary arteries appear to arise from the true lumen. Mediastinum/Nodes: Esophagus is within normal limits. No sizable hilar or mediastinal adenopathy is noted. Thoracic inlet is within normal limits. Lungs/Pleura: Lungs are well aerated bilaterally and demonstrate some central perivascular edema likely related in part due to the mass effect from the aortic dissection in periaortic hematoma. No focal confluent infiltrate is seen. Upper Abdomen: Visualized upper abdomen appears within normal limits with better described on the concomitant CT of the abdomen and pelvis. Musculoskeletal: No chest wall abnormality. No acute or significant osseous findings. Review of the MIP images confirms the above findings. IMPRESSION: Changes consistent with a type a dissection of the aorta involving the anterior and descending portions with extension into the right innominate artery and into the origin of the left subclavian artery. No definitive involvement of the common carotid artery on the left is noted. The extent of the dissection flap distally is somewhat difficult to evaluate although felt to extend into the midportion of the descending thoracic aorta. Arterial timed CTA of the chest may be helpful as clinically necessary. Adjacent to the ascending aorta just above the aortic root is a periaortic hematoma which is better visualized on the regional images of the abdomen and pelvis film. This measures approximately 3.7 x 2.4 cm in greatest dimension but again is somewhat limited in evaluation due to the timing of the contrast bolus and lack of delayed images in the chest. Central perivascular edema is noted  within the lungs bilaterally likely related to central compression of the vasculature related to aortic changes. No evidence of pulmonary emboli although there is significant attenuation of the lumen of the pulmonary artery related to the periaortic hematoma. Aortic aneurysm NOS (ICD10-I71.9). Critical Value/emergent results were called by telephone at the time of interpretation on 05/29/2020 at 11:44 am to Dr. Roosevelt Locks, who verbally acknowledged these results. Electronically Signed   By: Inez Catalina M.D.   On: 05/29/2020 11:59   DG Chest Portable 1 View  Result Date: 05/29/2020 CLINICAL DATA:  Chest pain. EXAM: PORTABLE CHEST 1 VIEW COMPARISON:  05/21/2010 FINDINGS: The heart size and mediastinal contours are within normal limits. No consolidation. No visible pleural effusions or pneumothorax. The visualized skeletal structures are unremarkable. IMPRESSION: No acute cardiopulmonary disease. Electronically Signed   By: Margaretha Sheffield MD   On: 05/29/2020 09:16    Procedures Procedures (including critical care time)  Medications Ordered in ED Medications  aspirin EC tablet 325 mg (325 mg Oral Not Given 05/29/20 0944)  sodium chloride 0.9 % bolus 1,000 mL (0 mLs Intravenous Stopped 05/29/20 1029)  aspirin chewable tablet 243 mg (243 mg Oral Given 05/29/20 0948)  iohexol (OMNIPAQUE) 350 MG/ML injection 75 mL (75 mLs Intravenous Contrast Given 05/29/20 1111)  sodium chloride 0.9 % bolus 1,000 mL (1,000 mLs Intravenous New Bag/Given 05/29/20 1214)    ED Course  I have reviewed the triage vital signs and the nursing notes.  Pertinent labs & imaging results that were available during my care of the patient were reviewed by me and considered in my medical decision making (see chart for details).    MDM Rules/Calculators/A&P HEAR Score: 5  Medical Decision Making: COLLIS THEDE is a 69 y.o. male who presented to the ED today with syncopal episode during exercise with  sudden CP.  Pt had witnessed syncope, no seizure activity reported, consciousness regained within seconds of falling to the ground. Pt report chest heaviness prior to onset, denies headache, vision change, dizziness. Pt denies recent illness or injury.   Past medical history significant for HTN and seizure disorder (on AED) Reviewed and confirmed nursing documentation for past medical history, family history, social history.  On my initial exam, the pt was in NAD, alert and oriented, not diaphoretic, normal WOB, lungs CTAB,  reporting continued CP.   The patient did not receive 325 mg aspirin prior to arrival; therefore, I did provide aspirin in the ED. Symptomatic treatment with nitroglycerin was not required. Pt hypotensive on arrival, BP 86/46.  Given 1 L fluid bolus. The ECG reveals ST elevation in V2 and V3, likely consistent with LVH, no prior for comparison, concern for STEMI.  To further evaluate for ongoing myocardial ischemia, serial troponins will be ordered. The first of these is 31, will trend troponins to peak. ACS remains on the differential. The patient was appropriately risk stratified as HEAR score of 5.    Cardiology consulted. No STEMI criteria at this time.   CXR read No acute cardiopulmonary disease. However, compared to prior there is widened mediastinum.   Pt persistently Hypotensive, BP 70/62, given 2nd liter fluid bolus.  Notified by nursing that pt de-sating to 85% on RA, placed on 3L [place] with improvement.   Concern for Pulmonary embolism in pt w syncope, CP and hypotension, intermittently desating, D- dimer >20, CTA obtained.  COVID negative.  CBC and CMP unremarkable.  Repeat trop 569.   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  CT surgery consulted.  Type and screen obtained.  Bilateral BP shows L BP 123/86 and R BP 69/52  CT surgery at bedside- plan for OR for aortic repair  All radiology and laboratory studies reviewed independently and with my  attending physician, agree with reading provided by radiologist unless otherwise noted.   Upon reassessing patient, patient was comfortable appearing, NAD.   Based on the above findings, I believe patient requires admission.   Dispo:  To OR   The above care was discussed with and agreed upon by my attending physician.    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

## 2020-05-29 NOTE — Consult Note (Signed)
CARDIOLOGY CONSULT NOTE  Patient ID: Juan Wagner MRN: [id number] DOB/AGE: 70-May-1952 70 y.o.  Admit date: 05/29/2020 Referring Physician  Lajean Saver, MD Primary Physician:  Tonia Ghent, MD Reason for Consultation  Syncope and abnormal EKG  Patient ID: Juan Wagner, male    DOB: Sep 10, 1950, 69 y.o.   MRN: [id number]  Syncope and abnormal EKG  HPI:    Juan Wagner  is a 69 y.o. African-American male who is very active, exercises regularly, had gone to the gym today and was exercising fairly rigorously, started feeling dizzy.  He stopped exercise and went and sat down.  This was followed by a frank episode of syncope that lasted for just a few seconds, bystanders activated EMS and was brought to the emergency room.  Patient states that he did notice some mild chest discomfort while he was in the gym, but stated that it is very mild.  He still has minimal discomfort upon presentation to the ED but states that it is essentially resolved but feels his mouth is dry.  Otherwise he denies any other symptoms, no recent travel, no leg edema, no painful swelling of the lower extremity.  His wife is present at the bedside.  States that he was in his usual state of health until this episode.  No loss of bowel or bladder control, no postictal state, no involuntary movement.  His past medical history significant for hypertension, hyperlipidemia, he has had seizures in the past and last episode was in 2019.  He was told to have sleep apnea but does not use any CPAP.  States that he has no symptoms from obstructive sleep apnea.  Past Medical History:  Diagnosis Date  . ED (erectile dysfunction)   . Hyperlipidemia    2018 was 184- past hx   . Hypertension   . Seizures ([place])    1st event 03/2011- no seizure in years as of 2019  . Sleep apnea    no CPAP use as of 2019   Past Surgical History:  Procedure Laterality Date  . COLONOSCOPY    . KNEE SURGERY     scope L knee  . POLYPECTOMY    .  SEPTOPLASTY     broken nose   Social History   Tobacco Use  . Smoking status: Never Smoker  . Smokeless tobacco: Never Used  Substance Use Topics  . Alcohol use: No    Alcohol/week: 0.0 standard drinks    Marital Sttus: Married  ROS  Review of Systems  Cardiovascular: Positive for chest pain. Negative for dyspnea on exertion and leg swelling.  Gastrointestinal: Negative for melena.  Neurological: Positive for dizziness.  All other systems reviewed and are negative.  Objective   Vitals with BMI 05/29/2020 05/29/2020 05/29/2020  Height - - -  Weight - - -  BMI - - -  Systolic 78 82 81  Diastolic 56 51 68  Pulse 66 65 65    Blood pressure (!) 78/56, pulse 66, temperature (!) 97.5 F (36.4 C), resp. rate 19, SpO2 99 %.    Physical Exam Constitutional:      Appearance: Normal appearance.  HENT:     Mouth/Throat:     Mouth: Mucous membranes are moist.  Eyes:     Extraocular Movements: Extraocular movements intact.  Cardiovascular:     Rate and Rhythm: Normal rate and regular rhythm.     Pulses: Intact distal pulses.     Heart sounds: Normal heart sounds. No murmur heard.  No gallop.      Comments: No leg edema, no JVD. Pulmonary:     Effort: Pulmonary effort is normal.     Breath sounds: Normal breath sounds.  Abdominal:     General: Bowel sounds are normal.     Palpations: Abdomen is soft.  Musculoskeletal:     Cervical back: Normal range of motion.  Skin:    Capillary Refill: Capillary refill takes less than 2 seconds.  Neurological:     General: No focal deficit present.     Mental Status: He is alert and oriented to person, place, and time.  Psychiatric:        Mood and Affect: Mood normal.    Laboratory examination:   Recent Labs    02/15/20 0811 02/24/20 0849 05/29/20 0844  NA 138 138 138  K 4.1 3.8 3.4*  CL 103 102 104  CO2 28 30 22   GLUCOSE 92 91 194*  BUN 20 15 16   CREATININE 1.50 1.24 1.37*  CALCIUM 9.7 9.6 9.1  GFRNONAA  --   --  55*    CrCl cannot be calculated (Unknown ideal weight.).  CMP Latest Ref Rng & Units 05/29/2020 02/24/2020 02/15/2020  Glucose 70 - 99 mg/dL 194(H) 91 92  BUN 8 - 23 mg/dL 16 15 20   Creatinine 0.61 - 1.24 mg/dL 1.37(H) 1.24 1.50  Sodium 135 - 145 mmol/L 138 138 138  Potassium 3.5 - 5.1 mmol/L 3.4(L) 3.8 4.1  Chloride 98 - 111 mmol/L 104 102 103  CO2 22 - 32 mmol/L 22 30 28   Calcium 8.9 - 10.3 mg/dL 9.1 9.6 9.7  Total Protein 6.5 - 8.1 g/dL 6.7 - 6.8  Total Bilirubin 0.3 - 1.2 mg/dL 0.6 - 0.4  Alkaline Phos 38 - 126 U/L 74 - 74  AST 15 - 41 U/L 40 - 27  ALT 0 - 44 U/L 24 - 17   CBC Latest Ref Rng & Units 05/29/2020 05/18/2012 05/18/2012  WBC 4.0 - 10.5 K/uL 6.0 - 5.1  Hemoglobin 13.0 - 17.0 g/dL 12.8(L) 15.0 13.8  Hematocrit 39 - 52 % 40.5 44.0 40.0  Platelets 150 - 400 K/uL 221 - 195   Lipid Panel Recent Labs    02/15/20 0811  CHOL 186  TRIG 49.0  LDLCALC 97  VLDL 9.8  HDL 79.40  CHOLHDL 2     Ref Range & Units 08:44  Troponin I (High Sensitivity) <18 ng/L 31High     Ref Range & Units 08:44  D-Dimer, Quant 0.00 - 0.50 ug/mL-FEU >20.00High        HEMOGLOBIN A1C No results found for: HGBA1C, MPG TSH No results for input(s): TSH in the last 8760 hours. BNP (last 3 results)  Medications and allergies   Allergies  Allergen Reactions  . Lisinopril Other (See Comments)    Presumed cause of cough  . Prevnar [Pneumococcal 13-Val Conj Vacc]     Local reaction.   . Viagra [Sildenafil Citrate] Other (See Comments)    headache     No outpatient medications have been marked as taking for the 05/29/20 encounter [facility] Encounter).    Scheduled Meds: . aspirin EC  325 mg Oral Once   Continuous Infusions: PRN Meds:.   No intake/output data recorded. Total I/O In: 1000 [IV Piggyback:1000] Out: -     Radiology:   DG Chest Portable 1 View  Result Date: 05/29/2020 CLINICAL DATA:  Chest pain. EXAM: PORTABLE CHEST 1 VIEW COMPARISON:  05/21/2010 FINDINGS: The heart  size and  mediastinal contours are within normal limits. No consolidation. No visible pleural effusions or pneumothorax. The visualized skeletal structures are unremarkable. IMPRESSION: No acute cardiopulmonary disease. Electronically Signed   By: Margaretha Sheffield MD   On: 05/29/2020 09:16   CT CHEST pending Cardiac Studies:   None  Bedside echocardiogram 05/29/2020: Normal LV systolic function without wall motion abnormality.  There appears to be at least a moderate LVH.  No significant valvular abnormality.  Aortic root appears to be mildly dilated at 4.1 cm.  EKG:  EKG 05/29/2020: Normal sinus rhythm with rate of 68 bpm, left axis deviation, left anterior fascicular block.  Voltage criteria for LVH.  LVH with repolarization abnormality, cannot exclude lateral ischemia.  No prior EKG to compare.  Assessment   1.  Syncope and collapse 2.  Hypotension 3.  Hypoxemia 4.  History of hypertension 5.  Hypercholesterolemia 6.  Abnormal EKG. Medications Discontinued During This Encounter  Medication Reason  . aspirin chewable tablet 162 mg     Recommendations:   Patient while I was examining him continued to remain hypotensive and in fact dropped his blood pressure to 77 mmHg but completely asymptomatic.  Also his saturations dropped to around 86% on 2 L of nasal cannula oxygen.  He is in no acute distress.  In view of echocardiogram that was performed at the bedside, there is no wall motion abnormality, I am beginning to wonder if this is acute pulmonary embolism.  I discussed the findings with Dr. Ashok Cordia and will proceed with obtaining CT angiogram of the chest.  Fluid boluses were given, we can start him on dopamine for pressor support.  Fortunately patient in no acute distress, physical examination is remarkable, lungs are clear and his peripheral pulses in spite of low blood pressure very well felt including pedal pulses.  Chest x-ray does not demonstrate any mediastinal widening.  I will  continue to follow the patient closely and await further evaluation.  His troponin is elevated and ACS also cannot be excluded.  D-dimer is markedly elevated.   Adrian Prows, MD, [facility] 05/29/2020, 11:39 AM Office: [phone]

## 2020-05-29 NOTE — Anesthesia Procedure Notes (Signed)
Arterial Line Insertion Start/End11/02/2020 1:38 PM, 05/29/2020 1:43 PM Performed by: Colin Benton, CRNA, CRNA  Patient location: Pre-op. Preanesthetic checklist: patient identified, IV checked, site marked, risks and benefits discussed, surgical consent, monitors and equipment checked, pre-op evaluation, timeout performed and anesthesia consent Lidocaine 1% used for infiltration Left, radial was placed Catheter size: 20 G Hand hygiene performed , maximum sterile barriers used  and Seldinger technique used Allen's test indicative of satisfactory collateral circulation Attempts: 1 Procedure performed without using ultrasound guided technique. Following insertion, dressing applied and Biopatch. Post procedure assessment: normal  Patient tolerated the procedure well with no immediate complications.

## 2020-05-29 NOTE — Anesthesia Preprocedure Evaluation (Addendum)
Anesthesia Evaluation  Patient identified by MRN, date of birth, ID band Patient awake    Reviewed: Allergy & Precautions, NPO status , Patient's Chart, lab work & pertinent test results  Airway Mallampati: II  TM Distance: >3 FB     Dental   Pulmonary sleep apnea ,    breath sounds clear to auscultation       Cardiovascular hypertension,  Rhythm:Regular Rate:Normal     Neuro/Psych Seizures -,     GI/Hepatic   Endo/Other    Renal/GU      Musculoskeletal   Abdominal   Peds  Hematology   Anesthesia Other Findings   Reproductive/Obstetrics                             Anesthesia Physical Anesthesia Plan  ASA: IV  Anesthesia Plan: General   Post-op Pain Management:    Induction: Intravenous  PONV Risk Score and Plan: 2  Airway Management Planned: Oral ETT  Additional Equipment:   Intra-op Plan:   Post-operative Plan: Post-operative intubation/ventilation  Informed Consent: I have reviewed the patients History and Physical, chart, labs and discussed the procedure including the risks, benefits and alternatives for the proposed anesthesia with the patient or authorized representative who has indicated his/her understanding and acceptance.       Plan Discussed with: CRNA and Anesthesiologist  Anesthesia Plan Comments:         Anesthesia Quick Evaluation

## 2020-05-29 NOTE — ED Notes (Signed)
Patient transported to CT 

## 2020-05-29 NOTE — Anesthesia Procedure Notes (Signed)
Procedure Name: Intubation Date/Time: 05/29/2020 2:39 PM Performed by: Oletta Lamas, CRNA Pre-anesthesia Checklist: Patient identified, Emergency Drugs available, Suction available and Patient being monitored Patient Re-evaluated:Patient Re-evaluated prior to induction Oxygen Delivery Method: Circle System Utilized Preoxygenation: Pre-oxygenation with 100% oxygen Induction Type: IV induction Ventilation: Mask ventilation without difficulty Laryngoscope Size: Mac and 4 Grade View: Grade II Tube type: Oral Tube size: 8.0 mm Number of attempts: 1 Airway Equipment and Method: Stylet and Oral airway Placement Confirmation: ETT inserted through vocal cords under direct vision,  positive ETCO2 and breath sounds checked- equal and bilateral Secured at: 23 cm Tube secured with: Tape Dental Injury: Teeth and Oropharynx as per pre-operative assessment

## 2020-05-29 NOTE — Plan of Care (Signed)
  Problem: Education: Goal: Will demonstrate proper wound care and an understanding of methods to prevent future damage Outcome: Progressing Goal: Knowledge of disease or condition will improve Outcome: Progressing Goal: Knowledge of the prescribed therapeutic regimen will improve Outcome: Progressing Goal: Individualized Educational Video(s) Outcome: Progressing   Problem: Cardiac: Goal: Will achieve and/or maintain hemodynamic stability Outcome: Progressing   Problem: Clinical Measurements: Goal: Postoperative complications will be avoided or minimized Outcome: Progressing   Problem: Respiratory: Goal: Respiratory status will improve Outcome: Progressing   Problem: Skin Integrity: Goal: Wound healing without signs and symptoms of infection Outcome: Progressing Goal: Risk for impaired skin integrity will decrease Outcome: Progressing   Problem: Urinary Elimination: Goal: Ability to achieve and maintain adequate renal perfusion and functioning will improve Outcome: Progressing

## 2020-05-29 NOTE — Transfer of Care (Signed)
Immediate Anesthesia Transfer of Care Note  Patient: Juan Wagner  Procedure(s) Performed: REPAIR OF ACUTE ASCENDING THORACIC AORTIC DISSECTION USING HEMASHIELD PLATINUM WOVEN DOUBLE VELOUR VASCULAR GRAFT 32 MM X 30 CM (N/A ) TRANSESOPHAGEAL ECHOCARDIOGRAM (TEE) (N/A )  Patient Location: ICU  Anesthesia Type:General  Level of Consciousness: sedated and Patient remains intubated per anesthesia plan  Airway & Oxygen Therapy: Patient remains intubated per anesthesia plan and Patient placed on Ventilator (see vital sign flow sheet for setting)  Post-op Assessment: Report given to RN and Post -op Vital signs reviewed and stable  Post vital signs: Reviewed and stable  Last Vitals:  Vitals Value Taken Time  BP    Temp 36.1 C 05/29/20 2144  Pulse 90 05/29/20 2144  Resp 14 05/29/20 2144  SpO2 100 % 05/29/20 2144  Vitals shown include unvalidated device data.  Last Pain:  Vitals:   05/29/20 1138  TempSrc: Oral  PainSc:          Complications: No complications documented.  Surgeon at bedside

## 2020-05-29 NOTE — Anesthesia Procedure Notes (Signed)
Central Venous Catheter Insertion Performed by: Belinda Block, MD, anesthesiologist Start/End11/02/2020 1:45 PM, 05/29/2020 2:00 PM Patient location: Pre-op. Preanesthetic checklist: patient identified, IV checked, site marked, risks and benefits discussed, surgical consent, monitors and equipment checked, pre-op evaluation, timeout performed and anesthesia consent Hand hygiene performed  and maximum sterile barriers used  PA cath was placed.Swan type:thermodilution Procedure performed without using ultrasound guided technique. Ultrasound Notes:anatomy identified and image(s) printed for medical record Attempts: 1 Following insertion, line sutured, dressing applied and Biopatch. Patient tolerated the procedure well with no immediate complications.

## 2020-05-29 NOTE — ED Notes (Signed)
Bilateral BP taken. Left arm shows BP 123/86 and right arm shows BP 69/52. CT surgeon at bedside.

## 2020-05-29 NOTE — ED Notes (Signed)
MD aware of patient's BP. No new orders at this time.

## 2020-05-29 NOTE — ED Notes (Signed)
Upon administering ASA ordered pt stated he took 81mg  ASA this AM. MD notified.

## 2020-05-29 NOTE — Brief Op Note (Signed)
05/29/2020  6:56 PM  PATIENT:  Juan Wagner  69 y.o. 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 USING HEMASHIELD PLATINUM WOVEN DOUBLE VELOUR VASCULAR GRAFT 32 MM X 30 CM (N/A) TRANSESOPHAGEAL ECHOCARDIOGRAM (TEE) (N/A)  SURGEON:  Surgeon(s) and Role: Panel 1:    * Wonda Olds, MD - Primary Panel 2:    * Wonda Olds, MD - Primary  PHYSICIAN ASSISTANT: WAYNE GOLD PA-C  ASSISTANTS: STAFF  ANESTHESIA:   general  EBL:  Per anes   BLOOD ADMINISTERED:PRBC'S,FFP, CRYO, PLATELETS- SEE ANEST/PERFUSION RECORDS FOR DETAILS  DRAINS: MEDIASTINAL   LOCAL MEDICATIONS USED:  NONE  SPECIMEN:  Source of Specimen:  AORTIC DISSECTION  DISPOSITION OF SPECIMEN:  PATHOLOGY  COUNTS:  YES  TOURNIQUET:  * No tourniquets in log *  DICTATION: .Dragon Dictation  PLAN OF CARE: Admit to inpatient   PATIENT DISPOSITION:  ICU - intubated and hemodynamically stable.   Delay start of Pharmacological VTE agent (>24hrs) due to surgical blood loss or risk of bleeding: yes  COMPLICATIONS: NO KNOWN

## 2020-05-29 NOTE — H&P (Signed)
[address]       [place] [address]             [phone]        Yves R Thaw [place] Medical Record #[id number] Date of Birth: [birth year]  Referring: Dr Einar Gip Primary Care: Tonia Ghent, MD Primary Cardiologist:No primary care provider on file.  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  He notes that this morning they started with jumping jacks and then he moved to doing push-ups.  While doing push-ups he felt weak, walked to an adjacent room and sat down.  He is not sure if he lost consciousness but did have a presyncopal episode.,  With the symptoms he came to the emergency room CT scan of the chest was done and is noted below-confirming the presence of a type I aortic dissection.  In the emergency room the patient is awake alert with mild chest discomfort, he is able to relate his history in good detail.  He has no obvious neurologic deficit.  Pulse in the right arm is weaker than pulse in the left, lower extremity pulses are full and equal bilaterally.  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  His wife daughter and son present.    Current Activity/ Functional Status: Patient is independent with mobility/ambulation, transfers, ADL's, IADL's.   Zubrod Score: At the time of surgery this patient's most appropriate activity status/level should be described as: [x]     0    Normal activity, no symptoms []     1    Restricted in physical strenuous activity but ambulatory, able to do out light work []     2    Ambulatory and capable of self care, unable to do work  activities, up and about                 more than 50%  Of the time                            []     3    Only limited self care, in bed greater than 50% of waking hours []     4    Completely disabled, no self care, confined to bed or chair []     5    Moribund  Past Medical History:  Diagnosis Date  . ED (erectile dysfunction)   . Hyperlipidemia    2018 was 184- past hx   . Hypertension   . Seizures ([place])    1st event 03/2011- no seizure in years as of 2019  . Sleep apnea    no CPAP use as of 2019    Past Surgical History:  Procedure Laterality Date  . COLONOSCOPY    . KNEE SURGERY     scope L knee  . POLYPECTOMY    . SEPTOPLASTY     broken nose    Social History   Tobacco Use  Smoking Status Never Smoker  Smokeless Tobacco Never Used    Social History   Substance and Sexual Activity  Alcohol Use No  .  Alcohol/week: 0.0 standard drinks     Allergies  Allergen Reactions  . Lisinopril Other (See Comments)    Presumed cause of cough  . Prevnar [Pneumococcal 13-Val Conj Vacc]     Local reaction.   . Viagra [Sildenafil Citrate] Other (See Comments)    headache    Current Facility-Administered Medications  Medication Dose Route Frequency Provider Last Rate Last Admin  . aspirin EC tablet 325 mg  325 mg Oral Once Roosevelt Locks, MD       Current Outpatient Medications  Medication Sig Dispense Refill  . amLODipine (NORVASC) 10 MG tablet Take 1 tablet (10 mg total) by mouth daily. 90 tablet 3  . aspirin 81 MG tablet Take 81 mg by mouth daily.      Marland Kitchen atorvastatin (LIPITOR) 10 MG tablet Take 1 tablet (10 mg total) by mouth daily. 90 tablet 3  . hydrochlorothiazide (HYDRODIURIL) 12.5 MG tablet Take 1 tablet (12.5 mg total) by mouth daily. 90 tablet 3  . Multiple Vitamin (MULTIVITAMIN) tablet Take 1 tablet by mouth daily.      Marland Kitchen oxcarbazepine (TRILEPTAL) 600 MG tablet Take 600 mg by mouth 2 (two) times daily.      (Not in a hospital admission)   Family History   Problem Relation Age of Onset  . Hypertension Mother   . Cancer Mother        ovarian CAchemo 2010 remission  . Breast cancer Mother   . Ovarian cancer Mother   . Alcohol abuse Father   . Cancer Father   . Throat cancer Father   . Esophageal cancer Father   . Cancer Sister        pancreatic CA Stage IV,mets to lungs  . Pancreatic cancer Sister   . Cirrhosis Brother        smoke and drugs  . Cancer Brother        lung cancer  . Lung cancer Brother   . Hypertension Maternal Grandmother   . Hypertension Maternal Grandfather   . Prostate cancer Neg Hx   . Colon cancer Neg Hx   . Colon polyps Neg Hx   . Rectal cancer Neg Hx   . Stomach cancer Neg Hx      Review of Systems:   Pertinent items are noted in HPI.     Cardiac Review of Systems: Y or  [    ]= no  Chest Pain [  y  ]  Resting SOB [ n  ] Exertional SOB  [ y ]  Orthopnea Blue.Reese  ]   Pedal Edema [ n  ]    Palpitations [n  ] Syncope  [?  ]   Presyncope Blue.Reese  ]  General Review of Systems: [Y] = yes [  ]=no Constitional: recent weight change [  ]; anorexia [  ]; fatigue [  ]; nausea [  ]; night sweats [  ]; fever [  ]; or chills [  ]                                                               Dental: Last Dentist visit:   Eye : blurred vision [  ]; diplopia [   ]; vision changes [  ];  Amaurosis fugax[  ]; Resp: cough [  ];  wheezing[  ];  hemoptysis[  ]; shortness of breath[y  ]; paroxysmal nocturnal dyspnea[  ]; dyspnea on exertion[  ]; or orthopnea[  ];  GI:  gallstones[  ], vomiting[  ];  dysphagia[  ]; melena[  ];  hematochezia [  ]; heartburn[  ];   Hx of  Colonoscopy[  ]; GU: kidney stones [  ]; hematuria[  ];   dysuria [  ];  nocturia[  ];  history of     obstruction [  ]; urinary frequency [  ]             Skin: rash, swelling[  ];, hair loss[  ];  peripheral edema[  ];  or itching[  ]; Musculosketetal: myalgias[  ];  joint swelling[  ];  joint erythema[  ];  joint pain[  ];  back pain[  ];  Heme/Lymph: bruising[  ];   bleeding[  ];  anemia[  ];  Neuro: TIA[  ];  headaches[  ];  stroke[  ];  vertigo[  ];  seizures[  ];   paresthesias[  ];  difficulty walking[  ];  Psych:depression[  ]; anxiety[  ];  Endocrine: diabetes[  ];  thyroid dysfunction[  ];             Patient has had Covid vaccination x3   Physical Exam: BP 133/87   Pulse 69   Temp 97.6 F (36.4 C) (Oral)   Resp 20   SpO2 95%   Left arm blood pressure, in the right arm blood pressure lower 88 systolic  General appearance: alert, cooperative, appears stated age and mild distress Head: Normocephalic, without obvious abnormality, atraumatic Neck: no adenopathy, no carotid bruit, no JVD, supple, symmetrical, trachea midline and thyroid not enlarged, symmetric, no tenderness/mass/nodules Lymph nodes: Cervical, supraclavicular, and axillary nodes normal. Resp: clear to auscultation bilaterally Cardio: regular rate and rhythm, S1, S2 normal, no murmur of aortic insufficiency, click, rub or gallop GI: soft, non-tender; bowel sounds normal; no masses,  no organomegaly Extremities: extremities normal, atraumatic, no cyanosis or edema and Homans sign is negative, no sign of DVT Neurologic: Grossly normal  Patient has 2+ radial and brachial pulse on the left very weak right radial and brachial pulse barely palpable, full DP and PT pulses lower extremities bilaterally and equal   Diagnostic Studies & Laboratory data:     Recent Radiology Findings:   CT Angio Chest PE W and/or Wo Contrast  Result Date: 05/29/2020 CLINICAL DATA:  Substernal chest pain and recent syncopal episode EXAM: CT ANGIOGRAPHY CHEST WITH CONTRAST TECHNIQUE: Multidetector CT imaging of the chest was performed using the standard protocol during bolus administration of intravenous contrast. Multiplanar CT image reconstructions and MIPs were obtained to evaluate the vascular anatomy. CONTRAST:  88mL OMNIPAQUE IOHEXOL 350 MG/ML SOLN COMPARISON:  Chest x-ray from earlier in the same day.  FINDINGS: Cardiovascular: There are changes consistent with a type a dissection involving the ascending aorta (originating just above the sino-tubular junction) and extending into descending aorta at least to its mid thoracic level. The timing of the contrast bolus for evaluation of the pulmonary artery somewhat limits the more distal evaluation of aorta but the dissection flap extends at least to just below the level of the carina. The ascending aorta measures 4.1 cm in greatest dimension. There is significant compromise of the true lumen in the distal aspect of the ascending aorta related to compression by the false lumen. The dissection extends into the level of the right  innominate vein and into the origin of the left subclavian artery. Evaluation of the left common carotid artery is limited due to scatter from the contrast bolus in the left subclavian artery. The pulmonary artery is significantly attenuated by mass effect from the aortic hematoma in the left lateral aspect of the ascending aorta. Delayed images at the beginning of the CT of the abdomen and pelvis reveal opacification of this aortic hematoma although no contrast extends into the pericardial sac. No filling defect to suggest pulmonary embolism is identified however. No significant coronary calcifications are noted. No significant pericardial effusion is seen. Coronary arteries appear to arise from the true lumen. Mediastinum/Nodes: Esophagus is within normal limits. No sizable hilar or mediastinal adenopathy is noted. Thoracic inlet is within normal limits. Lungs/Pleura: Lungs are well aerated bilaterally and demonstrate some central perivascular edema likely related in part due to the mass effect from the aortic dissection in periaortic hematoma. No focal confluent infiltrate is seen. Upper Abdomen: Visualized upper abdomen appears within normal limits with better described on the concomitant CT of the abdomen and pelvis. Musculoskeletal: No  chest wall abnormality. No acute or significant osseous findings. Review of the MIP images confirms the above findings. IMPRESSION: Changes consistent with a type a dissection of the aorta involving the anterior and descending portions with extension into the right innominate artery and into the origin of the left subclavian artery. No definitive involvement of the common carotid artery on the left is noted. The extent of the dissection flap distally is somewhat difficult to evaluate although felt to extend into the midportion of the descending thoracic aorta. Arterial timed CTA of the chest may be helpful as clinically necessary. Adjacent to the ascending aorta just above the aortic root is a periaortic hematoma which is better visualized on the regional images of the abdomen and pelvis film. This measures approximately 3.7 x 2.4 cm in greatest dimension but again is somewhat limited in evaluation due to the timing of the contrast bolus and lack of delayed images in the chest. Central perivascular edema is noted within the lungs bilaterally likely related to central compression of the vasculature related to aortic changes. No evidence of pulmonary emboli although there is significant attenuation of the lumen of the pulmonary artery related to the periaortic hematoma. Aortic aneurysm NOS (ICD10-I71.9). Critical Value/emergent results were called by telephone at the time of interpretation on 05/29/2020 at 11:44 am to Dr. Roosevelt Locks, who verbally acknowledged these results. Electronically Signed   By: Inez Catalina M.D.   On: 05/29/2020 11:59   I have independently reviewed the above radiology studies  and reviewed the findings with the patient.    DG Chest Portable 1 View  Result Date: 05/29/2020 CLINICAL DATA:  Chest pain. EXAM: PORTABLE CHEST 1 VIEW COMPARISON:  05/21/2010 FINDINGS: The heart size and mediastinal contours are within normal limits. No consolidation. No visible pleural effusions or  pneumothorax. The visualized skeletal structures are unremarkable. IMPRESSION: No acute cardiopulmonary disease. Electronically Signed   By: Margaretha Sheffield MD   On: 05/29/2020 09:16     I have independently reviewed the above radiologic studies and discussed with the patient  Bedside echocardiogram done in the emergency room, confirms a sending aortic dissection, but without evidence of aortic insufficiency, no significant wall motion abnormalities noted   Recent Lab Findings: Lab Results  Component Value Date   WBC 6.0 05/29/2020   HGB 12.8 (L) 05/29/2020   HCT 40.5 05/29/2020   PLT 221 05/29/2020  GLUCOSE 194 (H) 05/29/2020   CHOL 186 02/15/2020   TRIG 49.0 02/15/2020   HDL 79.40 02/15/2020   LDLDIRECT 123.8 12/25/2009   LDLCALC 97 02/15/2020   ALT 24 05/29/2020   AST 40 05/29/2020   NA 138 05/29/2020   K 3.4 (L) 05/29/2020   CL 104 05/29/2020   CREATININE 1.37 (H) 05/29/2020   BUN 16 05/29/2020   CO2 22 05/29/2020   TSH 1.30 12/25/2009      Assessment / Plan:   #1  Acute ascending aortic dissection-likely related to physical exertion, with involvement of innominate artery origin of the left's carotid and extending into the descending aorta-the full extent distally is hard to evaluate without repeat scanning and additional contrast.  Echocardiogram confirms evidence of aortic ascending dissection with a trileaflet aortic valve and without evidence of aortic insufficiency or pericardial effusion.  CT scan shows large mediastinal hematoma.  I discussed with the patient and his wife, the diagnosis and the need to proceed with urgent surgical repair.  The magnitude of the operation is discussed including risks and options.  They are aware that without surgery the mortality is very high in a short period of time.  The son and daughter were not allowed in the hospital but I did go outside the door of the emergency room and discussed with him the current situation with her father.   Dr. Orvan Seen is available to proceed with surgery on an urgent basis as the OR becomes available in the next hour.  The goals risks and alternatives of the planned surgical procedure Procedure(s): REPAIR OF ACUTE ASCENDING THORACIC AORTIC DISSECTION (N/A) TRANSESOPHAGEAL ECHOCARDIOGRAM (TEE) (N/A)  have been discussed with the patient wife son and daughter in detail. The risks of the procedure including death, infection, stroke, myocardial infarction, bleeding, blood transfusion have all been discussed specifically. The patient's questions have been answered.DARYLE AMIS is willing  to proceed with the planned procedure.     Grace Isaac MD      [address] [place] [address] Office [phone]     05/29/2020 1:05 PM

## 2020-05-29 NOTE — Progress Notes (Signed)
MD at bedside- reviewed POC. Orders for delayed wean. Second CO/CI 2.7/1.35- orders to start low dose epi and d/c neo.

## 2020-05-30 ENCOUNTER — Inpatient Hospital Stay (HOSPITAL_COMMUNITY): Payer: Medicare Other

## 2020-05-30 ENCOUNTER — Encounter (HOSPITAL_COMMUNITY): Payer: Self-pay | Admitting: Cardiothoracic Surgery

## 2020-05-30 LAB — PREPARE CRYOPRECIPITATE
Unit division: 0
Unit division: 0
Unit division: 0

## 2020-05-30 LAB — BPAM FFP
Blood Product Expiration Date: 202111092359
Blood Product Expiration Date: 202111092359
Blood Product Expiration Date: 202111092359
Blood Product Expiration Date: 202111092359
Blood Product Expiration Date: 202111092359
Blood Product Expiration Date: 202111092359
ISSUE DATE / TIME: 202111081839
ISSUE DATE / TIME: 202111081839
ISSUE DATE / TIME: 202111081953
ISSUE DATE / TIME: 202111081953
ISSUE DATE / TIME: 202111081953
ISSUE DATE / TIME: 202111081953
Unit Type and Rh: 7300
Unit Type and Rh: 7300
Unit Type and Rh: 7300
Unit Type and Rh: 7300
Unit Type and Rh: 7300
Unit Type and Rh: 7300

## 2020-05-30 LAB — BPAM PLATELET PHERESIS
Blood Product Expiration Date: 202111082359
Blood Product Expiration Date: 202111082359
ISSUE DATE / TIME: 202111081835
ISSUE DATE / TIME: 202111081835
Unit Type and Rh: 6200
Unit Type and Rh: 6200

## 2020-05-30 LAB — GLUCOSE, CAPILLARY
Glucose-Capillary: 114 mg/dL — ABNORMAL HIGH (ref 70–99)
Glucose-Capillary: 132 mg/dL — ABNORMAL HIGH (ref 70–99)
Glucose-Capillary: 139 mg/dL — ABNORMAL HIGH (ref 70–99)
Glucose-Capillary: 152 mg/dL — ABNORMAL HIGH (ref 70–99)
Glucose-Capillary: 153 mg/dL — ABNORMAL HIGH (ref 70–99)
Glucose-Capillary: 156 mg/dL — ABNORMAL HIGH (ref 70–99)
Glucose-Capillary: 158 mg/dL — ABNORMAL HIGH (ref 70–99)
Glucose-Capillary: 158 mg/dL — ABNORMAL HIGH (ref 70–99)
Glucose-Capillary: 161 mg/dL — ABNORMAL HIGH (ref 70–99)
Glucose-Capillary: 162 mg/dL — ABNORMAL HIGH (ref 70–99)
Glucose-Capillary: 163 mg/dL — ABNORMAL HIGH (ref 70–99)
Glucose-Capillary: 164 mg/dL — ABNORMAL HIGH (ref 70–99)
Glucose-Capillary: 168 mg/dL — ABNORMAL HIGH (ref 70–99)
Glucose-Capillary: 176 mg/dL — ABNORMAL HIGH (ref 70–99)
Glucose-Capillary: 190 mg/dL — ABNORMAL HIGH (ref 70–99)

## 2020-05-30 LAB — PREPARE PLATELET PHERESIS
Unit division: 0
Unit division: 0

## 2020-05-30 LAB — BASIC METABOLIC PANEL
Anion gap: 8 (ref 5–15)
BUN: 20 mg/dL (ref 8–23)
CO2: 23 mmol/L (ref 22–32)
Calcium: 8.2 mg/dL — ABNORMAL LOW (ref 8.9–10.3)
Chloride: 108 mmol/L (ref 98–111)
Creatinine, Ser: 1.43 mg/dL — ABNORMAL HIGH (ref 0.61–1.24)
GFR, Estimated: 53 mL/min — ABNORMAL LOW (ref >60)
Glucose, Bld: 177 mg/dL — ABNORMAL HIGH (ref 70–99)
Potassium: 4.4 mmol/L (ref 3.5–5.1)
Sodium: 139 mmol/L (ref 135–145)

## 2020-05-30 LAB — COMPREHENSIVE METABOLIC PANEL
ALT: 25 U/L (ref 0–44)
AST: 55 U/L — ABNORMAL HIGH (ref 15–41)
Albumin: 2.9 g/dL — ABNORMAL LOW (ref 3.5–5.0)
Alkaline Phosphatase: 30 U/L — ABNORMAL LOW (ref 38–126)
Anion gap: 10 (ref 5–15)
BUN: 21 mg/dL (ref 8–23)
CO2: 22 mmol/L (ref 22–32)
Calcium: 8.1 mg/dL — ABNORMAL LOW (ref 8.9–10.3)
Chloride: 107 mmol/L (ref 98–111)
Creatinine, Ser: 1.54 mg/dL — ABNORMAL HIGH (ref 0.61–1.24)
GFR, Estimated: 48 mL/min — ABNORMAL LOW (ref >60)
Glucose, Bld: 191 mg/dL — ABNORMAL HIGH (ref 70–99)
Potassium: 4.5 mmol/L (ref 3.5–5.1)
Sodium: 139 mmol/L (ref 135–145)
Total Bilirubin: 0.6 mg/dL (ref 0.3–1.2)
Total Protein: 4.6 g/dL — ABNORMAL LOW (ref 6.5–8.1)

## 2020-05-30 LAB — POCT I-STAT 7, (LYTES, BLD GAS, ICA,H+H)
Acid-base deficit: 2 mmol/L (ref 0.0–2.0)
Acid-base deficit: 1 mmol/L (ref 0.0–2.0)
Bicarbonate: 23.2 mmol/L (ref 20.0–28.0)
Bicarbonate: 24 mmol/L (ref 20.0–28.0)
Calcium, Ion: 1.19 mmol/L (ref 1.15–1.40)
Calcium, Ion: 1.21 mmol/L (ref 1.15–1.40)
HCT: 27 % — ABNORMAL LOW (ref 39.0–52.0)
HCT: 26 % — ABNORMAL LOW (ref 39.0–52.0)
Hemoglobin: 9.2 g/dL — ABNORMAL LOW (ref 13.0–17.0)
Hemoglobin: 8.8 g/dL — ABNORMAL LOW (ref 13.0–17.0)
O2 Saturation: 94 %
O2 Saturation: 93 %
Patient temperature: 37.9
Patient temperature: 38.1
Potassium: 4.4 mmol/L (ref 3.5–5.1)
Potassium: 4.5 mmol/L (ref 3.5–5.1)
Sodium: 143 mmol/L (ref 135–145)
Sodium: 142 mmol/L (ref 135–145)
TCO2: 24 mmol/L (ref 22–32)
TCO2: 25 mmol/L (ref 22–32)
pCO2 arterial: 41.8 mmHg (ref 32.0–48.0)
pCO2 arterial: 41 mmHg (ref 32.0–48.0)
pH, Arterial: 7.357 (ref 7.350–7.450)
pH, Arterial: 7.381 (ref 7.350–7.450)
pO2, Arterial: 80 mmHg — ABNORMAL LOW (ref 83.0–108.0)
pO2, Arterial: 74 mmHg — ABNORMAL LOW (ref 83.0–108.0)

## 2020-05-30 LAB — PREPARE FRESH FROZEN PLASMA
Unit division: 0
Unit division: 0
Unit division: 0
Unit division: 0

## 2020-05-30 LAB — CBC
HCT: 23.9 % — ABNORMAL LOW (ref 39.0–52.0)
HCT: 27.3 % — ABNORMAL LOW (ref 39.0–52.0)
Hemoglobin: 7.6 g/dL — ABNORMAL LOW (ref 13.0–17.0)
Hemoglobin: 9.1 g/dL — ABNORMAL LOW (ref 13.0–17.0)
MCH: 28.6 pg (ref 26.0–34.0)
MCH: 29.7 pg (ref 26.0–34.0)
MCHC: 31.8 g/dL (ref 30.0–36.0)
MCHC: 33.3 g/dL (ref 30.0–36.0)
MCV: 89.8 fL (ref 80.0–100.0)
MCV: 89.2 fL (ref 80.0–100.0)
Platelets: 93 10*3/uL — ABNORMAL LOW (ref 150–400)
Platelets: 93 10*3/uL — ABNORMAL LOW (ref 150–400)
RBC: 2.66 MIL/uL — ABNORMAL LOW (ref 4.22–5.81)
RBC: 3.06 MIL/uL — ABNORMAL LOW (ref 4.22–5.81)
RDW: 14.6 % (ref 11.5–15.5)
RDW: 15.2 % (ref 11.5–15.5)
WBC: 7.2 10*3/uL (ref 4.0–10.5)
WBC: 9.5 10*3/uL (ref 4.0–10.5)
nRBC: 0 % (ref 0.0–0.2)
nRBC: 0 % (ref 0.0–0.2)

## 2020-05-30 LAB — MAGNESIUM
Magnesium: 2.8 mg/dL — ABNORMAL HIGH (ref 1.7–2.4)
Magnesium: 2.4 mg/dL (ref 1.7–2.4)

## 2020-05-30 LAB — BPAM CRYOPRECIPITATE
Blood Product Expiration Date: 202111090022
Blood Product Expiration Date: 202111090141
Blood Product Expiration Date: 202111090141
ISSUE DATE / TIME: 202111081840
ISSUE DATE / TIME: 202111081955
ISSUE DATE / TIME: 202111081955
Unit Type and Rh: 5100
Unit Type and Rh: 5100
Unit Type and Rh: 5100

## 2020-05-30 LAB — MRSA PCR SCREENING: MRSA by PCR: NEGATIVE

## 2020-05-30 LAB — PREPARE RBC (CROSSMATCH)

## 2020-05-30 MED ORDER — SODIUM CHLORIDE 0.9% IV SOLUTION: Freq: Once | INTRAVENOUS | Status: AC

## 2020-05-30 MED ORDER — ORAL CARE MOUTH RINSE
15.0000 mL | Freq: Two times a day (BID) | OROMUCOSAL | Status: DC
  Administered 2020-05-30 – 2020-06-06 (×14): 15 mL via OROMUCOSAL

## 2020-05-30 MED ORDER — INSULIN ASPART 100 UNIT/ML ~~LOC~~ SOLN
0.0000 [IU] | SUBCUTANEOUS | Status: DC
  Administered 2020-05-30 – 2020-05-31 (×4): 2 [IU] via SUBCUTANEOUS
  Administered 2020-05-31: 4 [IU] via SUBCUTANEOUS
  Administered 2020-06-01 – 2020-06-03 (×7): 2 [IU] via SUBCUTANEOUS

## 2020-05-30 MED ORDER — THIAMINE HCL 100 MG/ML IJ SOLN
Freq: Once | INTRAVENOUS | Status: AC
  Filled 2020-05-30: qty 1000

## 2020-05-30 MED ORDER — COLCHICINE 0.3 MG HALF TABLET
0.3000 mg | ORAL_TABLET | Freq: Two times a day (BID) | ORAL | Status: DC
  Administered 2020-05-30 – 2020-06-06 (×15): 0.3 mg via ORAL
  Filled 2020-05-30 (×17): qty 1

## 2020-05-30 NOTE — Plan of Care (Signed)

## 2020-05-30 NOTE — Op Note (Signed)
CARDIOTHORACIC SURGERY OPERATIVE NOTE  Date of Procedure: 05/29/20 Preoperative Diagnosis: Acute type A aortic dissection  Postoperative Diagnosis: Same  Procedure:   Repair of acute type a aortic dissection with replacement of the ascending aorta under hypothermic circulatory arrest distally and super coronary graft implantation proximally  Right axillary cannulation  Surgeon: B.  Murvin Natal, MD  Assistant: Jadene Pierini, PA-C  Anesthesia: General endotracheal  Operative Findings:  Mildly reduced left ventricular systolic function  Primary tear at the level of the aortic root but just distal to the right coronary artery    BRIEF CLINICAL NOTE AND INDICATIONS FOR SURGERY  69 year old man was in his usual state of health until sudden onset of chest pain on the day of presentation.  He was taken to the emergency room where a CT scan to rule out pulmonary embolus was performed which demonstrated type a aortic dissection.  Consultation was obtained with thoracic surgery and he was taken urgently to the operating room for repair.  His condition was hemodynamically stable through induction of anesthesia.   DETAILS OF THE OPERATIVE PROCEDURE  Preparation:  The patient is brought to the operating room on the above mentioned date and central monitoring was established by the anesthesia team including placement of Swan-Ganz catheter and left radial arterial line. The patient is placed in the supine position on the operating table.  Intravenous antibiotics are administered. General endotracheal anesthesia is induced uneventfully. A Foley catheter is placed.  Baseline transesophageal echocardiogram was performed.  Findings were notable for mild central aortic regurgitation and preserved biventricular function  The patient's chest, abdomen, both groins, and both lower extremities are prepared and draped in a sterile manner. A time out procedure is performed.   Surgical Approach and Conduit  Harvest:  Attention was first turned to the right deltopectoral groove where an incision was made overlying the structure.  Dissection carried down to subcutaneous tissue and the pectoralis major minor muscles were divided.  The right axillary artery was encircled.  7,000 units of heparin were given intravenously.  A 10 mm dacryon graft was sewn end-to-side to the artery with a running suture of 5-0 Prolene.  The graft was then de-aired and a 22 [place] arterial cannula was inserted into the graft which was also de-aired and then attached to the arterial limb of the cardiopulmonary bypass instrument.  Next, median sternotomy incision was performed.The pericardium is opened and there was significant blood within the pericardium. The ascending aorta is ecchymotic and aneurysmal in appearance.  A pericardial well is created by placing pericardial tacking sutures.  Full dose heparin is given intravenously. Following systemic heparinization,  thright atrium is cannulated for cardiopulmonary bypass.  Adequate heparinization is verified.   A retrograde cardioplegia cannula is placed through the right atrium into the coronary sinus. Cardiopulmonary bypass was begun and the surface of the heart is inspected.  The left ventricular vent was inserted through the right superior pulmonary vein.  Carbon dioxide was blown into the field.  The patient is cooled to a goal temperature of 25 degreesC systemic temperature.  The aortic cross clamp is applied and cold blood cardioplegia is delivered initially in a retro-grade fashion. Supplemental cardioplegia is given rectally into the coronary ostia once the aorta itself was opened iced saline slush is applied for topical hypothermia.  The initial cardioplegic arrest is rapid with early diastolic arrest.  Repeat doses of cardioplegia are administered intermittently throughout the entire cross clamp portion of the operation through the aortic root and  through the coronary sinus  catheter in order to maintain completely flat electrocardiogram.   Once the heart was arrested attention was first turned to the reconstruction of the aortic root.  There was a primary care approximately 7 mm above the right coronary artery was extended medially towards the pulmonary artery.  This tissue was excised.  Neomedia creation with felt within the dissection flap was performed and secured with BioGlue.  A 32 millimeter Hemashield gold graft was brought onto the field and sewn into in to the residual ascending aorta approximately 2 to 3 mm above the level of the sinotubular junction.  This was done with a running suture of 3-0 Prolene.  Each aortic valve commissure was resuspended within the neoaorta.  After we had been cooling for approximately 50 minutes and the goal temperature for hypothermia was achieved circulatory arrest was undertaken.  Antegrade cerebral perfusion was performed by perfusing the right carotid artery after clamping the base of the innominate artery and the left carotid artery.  Should be noted that the patient had normal arch anatomy.  Still hemiarch reconstruction was performed using a 32 mm dacryon graft which had been beveled and sewn to the underside of the arch with a running suture of 3-0 Prolene.  When this completed, graft graft anastomosis with 4-0 Prolene was done.  De-airing procedures were performed and the aortic cross-clamp was removed. Procedure Completion:   Epicardial pacing wires are fixed to the right ventricular outflow tract and to the right atrial appendage. The patient is rewarmed to 37C temperature. The patient is weaned and disconnected from cardiopulmonary bypass.  The patient's rhythm at separation from bypass was normal sinus.  The patient was weaned from cardiopulmonary bypass with mild inotropic support.   Followup transesophageal echocardiogram performed after separation from bypass revealed  no changes from the preoperative exam.    The  aortic and venous cannula were removed uneventfully. Protamine was administered to reverse the anticoagulation.  Significant volume and coagulopathy resuscitation were required including multiple blood products and administration of NovoSeven.  The mediastinum and pleural space were inspected for hemostasis and irrigated with saline solution. The mediastinum and both pleural spaces were drained using fluted chest tubes placed through separate stab incisions inferiorly.  The soft tissues anterior to the aorta were reapproximated loosely. The sternum is closed with double strength sternal wire. The soft tissues anterior to the sternum were closed in multiple layers and the skin is closed with a running subcuticular skin closure.  The post-bypass portion of the operation was notable for stable rhythm and hemodynamics.     Disposition:  The patient tolerated the procedure well and is transported to the surgical intensive care in stable condition. There are no intraoperative complications. All sponge instrument and needle counts are verified correct at completion of the operation.    Jayme Cloud, MD 05/30/2020 8:51 AM

## 2020-05-30 NOTE — Procedures (Signed)
Extubation Procedure Note  Patient Details:   Name: Juan Wagner DOB: Aug 18, 1950 MRN: [id number]   Airway Documentation:    Vent end date: 05/30/20 Vent end time: 0803   Evaluation  O2 sats: stable throughout Complications: No apparent complications Patient did tolerate procedure well. Bilateral Breath Sounds: Clear, Diminished   Yes, pt could speak post extubation.  Pt extubated to 6 l/m [name] without difficulty.   Earney Navy 05/30/2020, 8:05 AM

## 2020-05-30 NOTE — Progress Notes (Signed)
1 Day Post-Op Procedure(s) (LRB): REPAIR OF ACUTE ASCENDING THORACIC AORTIC DISSECTION USING HEMASHIELD PLATINUM WOVEN DOUBLE VELOUR VASCULAR GRAFT 32 MM X 30 CM (N/A) TRANSESOPHAGEAL ECHOCARDIOGRAM (TEE) (N/A) Subjective: Sedated/intubated  Objective: Vital signs in last 24 hours: Temp:  [96.6 F (35.9 C)-99.9 F (37.7 C)] 99.7 F (37.6 C) (11/09 0700) Pulse Rate:  [64-93] 89 (11/09 0700) Cardiac Rhythm: Atrial paced (11/09 0400) Resp:  [0-23] 14 (11/09 0700) BP: (69-156)/(35-106) 103/71 (11/09 0700) SpO2:  [89 %-100 %] 100 % (11/09 0700) Arterial Line BP: (73-127)/(46-81) 105/57 (11/09 0700) FiO2 (%):  [40 %-50 %] 40 % (11/09 0717) Weight:  [81.6 kg-88.5 kg] 88.5 kg (11/09 0500)  Hemodynamic parameters for last 24 hours: PAP: (9-153)/(5-84) 23/11 CO:  [2.7 L/min-3.9 L/min] 3.6 L/min CI:  [1.4 L/min/m2-2 L/min/m2] 1.8 L/min/m2  Intake/Output from previous day: 11/08 0701 - 11/09 0700 In: 12317.8 [I.V.:4286.5; Blood:5238; IV Piggyback:2793.3] Out: 1607 [Urine:1275; Emesis/NG output:300; Blood:3500; Chest Tube:660] Intake/Output this shift: No intake/output data recorded.  General appearance: intubated/sedated Neurologic: unable to fully assess; opens eyes to voice Heart: regular rate and rhythm, S1, S2 normal, no murmur, click, rub or gallop Lungs: clear to auscultation bilaterally Abdomen: soft, non-tender; bowel sounds normal; no masses,  no organomegaly Extremities: extremities normal, atraumatic, no cyanosis or edema Wound: dressed  Lab Results: Recent Labs    05/29/20 2152 05/29/20 2152 05/29/20 2256 05/30/20 0409  WBC 8.1  --   --  7.2  HGB 8.8*   < > 7.8* 7.6*  HCT 27.0*   < > 23.0* 23.9*  PLT 91*  --   --  93*   < > = values in this interval not displayed.   BMET:  Recent Labs    05/29/20 0844 05/29/20 1446 05/29/20 2053 05/29/20 2053 05/29/20 2256 05/30/20 0409  NA 138   < > 141   < > 140 139  K 3.4*   < > 5.1   < > 5.0 4.4  CL 104   < > 103   --   --  108  CO2 22  --   --   --   --  23  GLUCOSE 194*   < > 148*  --   --  177*  BUN 16   < > 18  --   --  20  CREATININE 1.37*   < > 0.90  --   --  1.43*  CALCIUM 9.1  --   --   --   --  8.2*   < > = values in this interval not displayed.    PT/INR:  Recent Labs    05/29/20 2152  LABPROT 12.3  INR 1.0   ABG    Component Value Date/Time   PHART 7.389 05/29/2020 2256   HCO3 26.1 05/29/2020 2256   TCO2 27 05/29/2020 2256   ACIDBASEDEF 2.0 05/29/2020 1924   O2SAT 99.0 05/29/2020 2256   CBG (last 3)  Recent Labs    05/30/20 0502 05/30/20 0611 05/30/20 0708  GLUCAP 158* 164* 162*    Assessment/Plan: S/P Procedure(s) (LRB): REPAIR OF ACUTE ASCENDING THORACIC AORTIC DISSECTION USING HEMASHIELD PLATINUM WOVEN DOUBLE VELOUR VASCULAR GRAFT 32 MM X 30 CM (N/A) TRANSESOPHAGEAL ECHOCARDIOGRAM (TEE) (N/A) Mobilize extubate  Continue to resuscitate Repeat labs later today   LOS: 1 day    Wonda Olds 05/30/2020

## 2020-05-30 NOTE — Progress Notes (Signed)
Patient ID: Juan Wagner, male   DOB: Dec 09, 1950, 69 y.o.   MRN: [id number] TCTS Evening Rounds:  Hemodynamically stable CI 2.5 on epi 1. Will stop it and DC arterial line.  Urine output good. CT output low.  BMET    Component Value Date/Time   NA 139 05/30/2020 1406   K 4.5 05/30/2020 1406   CL 107 05/30/2020 1406   CO2 22 05/30/2020 1406   GLUCOSE 191 (H) 05/30/2020 1406   BUN 21 05/30/2020 1406   CREATININE 1.54 (H) 05/30/2020 1406   CREATININE 1.17 01/30/2013 1645   CALCIUM 8.1 (L) 05/30/2020 1406   GFRNONAA 48 (L) 05/30/2020 1406   GFRNONAA >60 01/30/2013 1645   GFRAA >60 01/30/2013 1645   CBC    Component Value Date/Time   WBC 9.5 05/30/2020 1406   RBC 3.06 (L) 05/30/2020 1406   HGB 9.1 (L) 05/30/2020 1406   HCT 27.3 (L) 05/30/2020 1406   PLT 93 (L) 05/30/2020 1406   MCV 89.2 05/30/2020 1406   MCH 29.7 05/30/2020 1406   MCHC 33.3 05/30/2020 1406   RDW 15.2 05/30/2020 1406   LYMPHSABS 1.6 05/29/2020 0844   MONOABS 0.8 05/29/2020 0844   EOSABS 0.1 05/29/2020 0844   BASOSABS 0.1 05/29/2020 0844

## 2020-05-31 ENCOUNTER — Inpatient Hospital Stay (HOSPITAL_COMMUNITY): Payer: Medicare Other

## 2020-05-31 LAB — BASIC METABOLIC PANEL
Anion gap: 7 (ref 5–15)
BUN: 22 mg/dL (ref 8–23)
CO2: 26 mmol/L (ref 22–32)
Calcium: 8.2 mg/dL — ABNORMAL LOW (ref 8.9–10.3)
Chloride: 102 mmol/L (ref 98–111)
Creatinine, Ser: 1.22 mg/dL (ref 0.61–1.24)
GFR, Estimated: >60 mL/min (ref >60)
Glucose, Bld: 128 mg/dL — ABNORMAL HIGH (ref 70–99)
Potassium: 4.6 mmol/L (ref 3.5–5.1)
Sodium: 135 mmol/L (ref 135–145)

## 2020-05-31 LAB — GLUCOSE, CAPILLARY
Glucose-Capillary: 110 mg/dL — ABNORMAL HIGH (ref 70–99)
Glucose-Capillary: 114 mg/dL — ABNORMAL HIGH (ref 70–99)
Glucose-Capillary: 135 mg/dL — ABNORMAL HIGH (ref 70–99)
Glucose-Capillary: 92 mg/dL (ref 70–99)
Glucose-Capillary: 81 mg/dL (ref 70–99)
Glucose-Capillary: 166 mg/dL — ABNORMAL HIGH (ref 70–99)

## 2020-05-31 LAB — SURGICAL PATHOLOGY

## 2020-05-31 LAB — CBC
HCT: 25.9 % — ABNORMAL LOW (ref 39.0–52.0)
Hemoglobin: 8.5 g/dL — ABNORMAL LOW (ref 13.0–17.0)
MCH: 29.2 pg (ref 26.0–34.0)
MCHC: 32.8 g/dL (ref 30.0–36.0)
MCV: 89 fL (ref 80.0–100.0)
Platelets: 81 10*3/uL — ABNORMAL LOW (ref 150–400)
RBC: 2.91 MIL/uL — ABNORMAL LOW (ref 4.22–5.81)
RDW: 15.4 % (ref 11.5–15.5)
WBC: 9.6 10*3/uL (ref 4.0–10.5)
nRBC: 0 % (ref 0.0–0.2)

## 2020-05-31 MED ORDER — FUROSEMIDE 10 MG/ML IJ SOLN
40.0000 mg | Freq: Two times a day (BID) | INTRAMUSCULAR | Status: DC
  Administered 2020-05-31 – 2020-06-03 (×7): 40 mg via INTRAVENOUS
  Filled 2020-05-31 (×7): qty 4

## 2020-05-31 MED FILL — Heparin Sodium (Porcine) Inj 1000 Unit/ML: INTRAMUSCULAR | Qty: 30 | Status: AC

## 2020-05-31 MED FILL — Lidocaine HCl Local Preservative Free (PF) Inj 2%: INTRAMUSCULAR | Qty: 15 | Status: AC

## 2020-05-31 MED FILL — Potassium Chloride Inj 2 mEq/ML: INTRAVENOUS | Qty: 40 | Status: AC

## 2020-05-31 NOTE — Progress Notes (Signed)
2 Days Post-Op Procedure(s) (LRB): REPAIR OF ACUTE ASCENDING THORACIC AORTIC DISSECTION USING HEMASHIELD PLATINUM WOVEN DOUBLE VELOUR VASCULAR GRAFT 32 MM X 30 CM (N/A) TRANSESOPHAGEAL ECHOCARDIOGRAM (TEE) (N/A) Subjective: No complaints  Objective: Vital signs in last 24 hours: Temp:  [97.8 F (36.6 C)-100.4 F (38 C)] 99.2 F (37.3 C) (11/10 0645) Pulse Rate:  [65-100] 78 (11/10 0700) Cardiac Rhythm: Normal sinus rhythm (11/10 0400) Resp:  [0-31] 18 (11/10 0700) BP: (107-149)/(64-86) 119/79 (11/10 0700) SpO2:  [82 %-100 %] 100 % (11/10 0700) Arterial Line BP: (83-158)/(40-82) 129/66 (11/10 0400) FiO2 (%):  [36 %-44 %] 36 % (11/09 1005) Weight:  [90.1 kg] 90.1 kg (11/10 0500)  Hemodynamic parameters for last 24 hours: PAP: (18-33)/(6-10) 23/10  Intake/Output from previous day: 11/09 0701 - 11/10 0700 In: 2416 [I.V.:1901.2; Blood:315; IV Piggyback:199.8] Out: 2295 [Urine:1225; Chest Tube:1070] Intake/Output this shift: No intake/output data recorded.  General appearance: alert and cooperative Neurologic: intact Heart: regular rate and rhythm, S1, S2 normal, no murmur, click, rub or gallop Lungs: clear to auscultation bilaterally Abdomen: soft, non-tender; bowel sounds normal; no masses,  no organomegaly Extremities: edema mild Wound: dressed  Lab Results: Recent Labs    05/30/20 1406 05/31/20 0226  WBC 9.5 9.6  HGB 9.1* 8.5*  HCT 27.3* 25.9*  PLT 93* 81*   BMET:  Recent Labs    05/30/20 1406 05/31/20 0226  NA 139 135  K 4.5 4.6  CL 107 102  CO2 22 26  GLUCOSE 191* 128*  BUN 21 22  CREATININE 1.54* 1.22  CALCIUM 8.1* 8.2*    PT/INR:  Recent Labs    05/29/20 2152  LABPROT 12.3  INR 1.0   ABG    Component Value Date/Time   PHART 7.381 05/30/2020 0901   HCO3 24.0 05/30/2020 0901   TCO2 25 05/30/2020 0901   ACIDBASEDEF 1.0 05/30/2020 0901   O2SAT 93.0 05/30/2020 0901   CBG (last 3)  Recent Labs    05/30/20 1942 05/30/20 2347 05/31/20 0437   GLUCAP 132* 114* 110*    Assessment/Plan: S/P Procedure(s) (LRB): REPAIR OF ACUTE ASCENDING THORACIC AORTIC DISSECTION USING HEMASHIELD PLATINUM WOVEN DOUBLE VELOUR VASCULAR GRAFT 32 MM X 30 CM (N/A) TRANSESOPHAGEAL ECHOCARDIOGRAM (TEE) (N/A) Mobilize Diuresis maybe remove tubes later today   LOS: 2 days    Wonda Olds 05/31/2020

## 2020-05-31 NOTE — Discharge Summary (Signed)
Physician Discharge Summary  Patient ID: Juan Wagner MRN: [id number] DOB/AGE: December 25, 1950 69 y.o.  Admit date: 05/29/2020 Discharge date: 06/06/2020  Admission Diagnoses: Aortic dissection/rupture  Discharge Diagnoses:  Active Problems:   Aortic dissection, thoracic [facility])   Patient Active Problem List   Diagnosis Date Noted  . Aortic dissection, thoracic ([place]) 05/29/2020  . Healthcare maintenance 02/04/2017  . Advance care planning 12/09/2014  . Sleep apnea syndrome 11/28/2014  . Medicare annual wellness visit, subsequent 01/07/2012  . Seizure ([place]) 04/01/2011  . Partial epilepsy with impairment of consciousness, intractable ([place]) 02/20/2011  . ERECTILE DYSFUNCTION 02/11/2008  . HLD (hyperlipidemia) 01/29/2007  . Essential hypertension 01/29/2007  . 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  He notes that this morning they started with jumping jacks and then he moved to doing push-ups.  While doing push-ups he felt weak, walked to an adjacent room and sat down.  He is not sure if he lost consciousness but did have a presyncopal episode.,  With the symptoms he came to the emergency room CT scan of the chest was done and is noted below-confirming the presence of a type I aortic dissection.  In the emergency room the patient is awake alert with mild chest discomfort, he is able to relate his history in good detail.  He has no obvious neurologic deficit.  Pulse in the right arm is weaker than pulse in the left, lower extremity pulses are full and equal bilaterally.  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  His wife  daughter and son present.   The patient was admitted for urgent surgical repair.  Discharged Condition: good  Hospital Course: The patient was taken urgently to the operating room where he underwent the below described procedure.  He tolerated it well.  He did require multiple transfusions of packed cells as well as products initially and bleeding did come under control.  He was taken to the surgical intensive care unit in stable condition.  Postoperative hospital course:  The patient has made very steady progress.  He has maintained stable hemodynamics.  He was weaned from the ventilator using standard protocols.  He did develop postoperative atrial fibrillation/flutter but has been chemically cardioverted to sinus rhythm..  All routine lines, monitors and drainage devices have been discontinued in the standard fashion.  Anemia has been monitored over time.  Most recent hemoglobin hematocrit dated 11/16 are 8.6/26.6.  He has had some postoperative volume overload which is responding well to diuretics.  He did have an acute bump in his creatinine but it is stabilizing over time.  Most recent BUN/creatinine dated 11/14 are 24/1.22.  He is tolerating gradually increasing activities using standard cardiac rehab protocols.  On postop day 3 he was transferred to the [facility]  progressive care unit.We continued to work on weaning of his supplemental oxygen. He remained on 3L [place]. His renal function continued to improve and we continued his diuretic regimen. We were able to resume his home dose of HCTZ. He did have bilateral pleural effusions which we continued to evaluate for the need of a thoracentesis inpatient. On 11/13 the patient underwent an US-guided thoracentesis which yielded 1L of blood  fluid. Subsequent chest xray revealed small bilateral pleural effusions remains with some presumed atelectasis.  Oxygen has been able to be weaned and he is tolerating cardiac rehab with plans to continue home  PT/OT.  Consults: PT/OT  Significant Diagnostic Studies: CTA abd and pelvis  Treatments:  CARDIOTHORACIC SURGERY OPERATIVE NOTE  Date of Procedure:    05/29/20 Preoperative Diagnosis:      Acute type A aortic dissection  Postoperative Diagnosis:    Same  Procedure:       Repair of acute type a aortic dissection with replacement of the ascending aorta under hypothermic circulatory arrest distally and super coronary graft implantation proximally  Right axillary cannulation  Surgeon:        B.  Murvin Natal, MD  Assistant:       Jadene Pierini, PA-C  Anesthesia:    General endotracheal  Operative Findings: ? Mildly reduced left ventricular systolic function ? Primary tear at the level of the aortic root but just distal to the right coronary artery    Discharge Exam: Blood pressure 128/72, pulse 88, temperature 98.4 F (36.9 C), temperature source Oral, resp. rate 20, height 5\' 9"  (1.753 m), weight 85.2 kg, SpO2 95 %.     General appearance: alert, cooperative and no distress Heart: regular rate and rhythm Lungs: dim in bases Abdomen: benign Extremities: no edema Wound: incis healing wellDisposition: Discharge disposition: 01-Home or Self Care       Discharge Instructions    Discharge patient   Complete by: As directed    Discharge disposition: 01-Home or Self Care   Discharge patient date: 06/06/2020     Allergies as of 06/06/2020      Reactions   Lisinopril Other (See Comments)   Presumed cause of cough   Prevnar [pneumococcal 13-val Conj Vacc]    Local reaction.    Viagra [sildenafil Citrate] Other (See Comments)   headache      Medication List    TAKE these medications   amiodarone 200 MG tablet Commonly known as: PACERONE Take 1 tablet (200 mg total) by mouth 2 (two) times daily.   amLODipine 10 MG tablet Commonly known as: NORVASC Take 1 tablet (10 mg total) by mouth daily.   aspirin 81 MG tablet Take 81 mg by mouth daily.    atorvastatin 10 MG tablet Commonly known as: LIPITOR Take 1 tablet (10 mg total) by mouth daily.   colchicine 0.6 MG tablet Take 0.5 tablets (0.3 mg total) by mouth 2 (two) times daily.   hydrochlorothiazide 12.5 MG tablet Commonly known as: HYDRODIURIL Take 1 tablet (12.5 mg total) by mouth daily.   Metoprolol Tartrate 37.5 MG Tabs Take 37.5 mg by mouth 2 (two) times daily.   multivitamin tablet Take 1 tablet by mouth daily.   oxcarbazepine 600 MG tablet Commonly known as: TRILEPTAL Take 600 mg by mouth 2 (two) times daily.   traMADol 50 MG tablet Commonly known as: ULTRAM Take 1 tablet (50 mg total) by mouth every 6 (six) hours as needed for up to 7 days for moderate pain.       Follow-up Information    Wonda Olds, MD Follow up.   Specialty: Cardiothoracic Surgery Why: Your follow-up appoinment is on 11/29 at 12:30pm. Please arrive at 12:00 noon for a chest xray located at [facility] which is on the first floor of our building. Contact information: [place] [address] [phone]        Damita Dunnings,  Elveria Rising, MD. Call in 1 day(s).   Specialty: Family Medicine Contact information: [place] [place] [address] [phone]        Adrian Prows, MD Follow up.   Specialty: Cardiology Why: Please call and schedule and appointment if you do not hear from Dr. Irven Shelling office Contact information: [address] [place] [address] [phone]               The patient has been discharged on:   1.Beta Blocker:  Yes [ yes  ]                              No   [   ]                              If No, reason:  2.Ace Inhibitor/ARB: Yes [   ]                                     No  [ no   ]                                     If No, reason: home HCTZ, allergic  3.Statin:   Yes [   ]                  No  [ no  ]                  If No, reason: no known CAD  4.Shela CommonsVelta Addison  [ yes  ]                  No   [   ]                  If No, reason:   Signed: Zauria Dombek E Ramsie Ostrander PA-C 06/06/2020, 1:10 PM

## 2020-05-31 NOTE — Progress Notes (Signed)
Patient ID: Juan Wagner, male   DOB: [birth year], 69 y.o.   MRN: [id number] EVENING ROUNDS NOTE :     [address]       [place] [address]             [phone]                 2 Days Post-Op Procedure(s) (LRB): REPAIR OF ACUTE ASCENDING THORACIC AORTIC DISSECTION USING HEMASHIELD PLATINUM WOVEN DOUBLE VELOUR VASCULAR GRAFT 32 MM X 30 CM (N/A) TRANSESOPHAGEAL ECHOCARDIOGRAM (TEE) (N/A)  Total Length of Stay:  LOS: 2 days  BP 112/71   Pulse 87   Temp 98.6 F (37 C) (Oral)   Resp 18   Ht 5\' 9"  (1.753 m)   Wt 90.1 kg   SpO2 99%   BMI 29.33 kg/m   .Intake/Output      11/09 0701 - 11/10 0700 11/10 0701 - 11/11 0700   I.V. (mL/kg) 1901.2 (21.1)    Blood 315    IV Piggyback 199.8    Total Intake(mL/kg) 2416 (26.8)    Urine (mL/kg/hr) 1320 (0.6) 1305 (1.6)   Emesis/NG output     Blood     Chest Tube 1080 170   Total Output 2400 1475   Net +16 -1475          . sodium chloride Stopped (05/30/20 0921)  . sodium chloride    . sodium chloride 20 mL/hr at 05/31/20 0700  . lactated ringers    . lactated ringers    . lactated ringers 20 mL/hr at 05/31/20 0700     Lab Results  Component Value Date   WBC 9.6 05/31/2020   HGB 8.5 (L) 05/31/2020   HCT 25.9 (L) 05/31/2020   PLT 81 (L) 05/31/2020   GLUCOSE 128 (H) 05/31/2020   CHOL 186 02/15/2020   TRIG 49.0 02/15/2020   HDL 79.40 02/15/2020   LDLDIRECT 123.8 12/25/2009   LDLCALC 97 02/15/2020   ALT 25 05/30/2020   AST 55 (H) 05/30/2020   NA 135 05/31/2020   K 4.6 05/31/2020   CL 102 05/31/2020   CREATININE 1.22 05/31/2020   BUN 22 05/31/2020   CO2 26 05/31/2020   TSH 1.30 12/25/2009   PSA 1.83 02/15/2020   INR 1.0 05/29/2020   Has been up ambulating Renal function stable    Grace Isaac MD  Beeper [phone] Office [phone] 05/31/2020 4:17 PM

## 2020-05-31 NOTE — Plan of Care (Signed)
  Problem: Education: Goal: Will demonstrate proper wound care and an understanding of methods to prevent future damage Outcome: Progressing Goal: Knowledge of disease or condition will improve Outcome: Progressing Goal: Knowledge of the prescribed therapeutic regimen will improve Outcome: Progressing Goal: Individualized Educational Video(s) Outcome: Progressing   Problem: Activity: Goal: Risk for activity intolerance will decrease Outcome: Progressing   Problem: Cardiac: Goal: Will achieve and/or maintain hemodynamic stability Outcome: Progressing   Problem: Clinical Measurements: Goal: Postoperative complications will be avoided or minimized Outcome: Progressing   Problem: Respiratory: Goal: Respiratory status will improve Outcome: Progressing   Problem: Skin Integrity: Goal: Wound healing without signs and symptoms of infection Outcome: Progressing Goal: Risk for impaired skin integrity will decrease Outcome: Progressing   Problem: Clinical Measurements: Goal: Ability to maintain clinical measurements within normal limits will improve Outcome: Progressing Goal: Will remain free from infection Outcome: Progressing Goal: Diagnostic test results will improve Outcome: Progressing Goal: Respiratory complications will improve Outcome: Progressing Goal: Cardiovascular complication will be avoided Outcome: Progressing   Problem: Activity: Goal: Risk for activity intolerance will decrease Outcome: Progressing   Problem: Nutrition: Goal: Adequate nutrition will be maintained Outcome: Progressing   Problem: Coping: Goal: Level of anxiety will decrease Outcome: Progressing   Problem: Elimination: Goal: Will not experience complications related to bowel motility Outcome: Progressing Goal: Will not experience complications related to urinary retention Outcome: Progressing   Problem: Pain Managment: Goal: General experience of comfort will improve Outcome:  Progressing   Problem: Safety: Goal: Ability to remain free from injury will improve Outcome: Progressing   Problem: Skin Integrity: Goal: Risk for impaired skin integrity will decrease Outcome: Progressing

## 2020-06-01 ENCOUNTER — Inpatient Hospital Stay (HOSPITAL_COMMUNITY): Payer: Medicare Other

## 2020-06-01 LAB — GLUCOSE, CAPILLARY
Glucose-Capillary: 105 mg/dL — ABNORMAL HIGH (ref 70–99)
Glucose-Capillary: 107 mg/dL — ABNORMAL HIGH (ref 70–99)
Glucose-Capillary: 132 mg/dL — ABNORMAL HIGH (ref 70–99)
Glucose-Capillary: 55 mg/dL — ABNORMAL LOW (ref 70–99)
Glucose-Capillary: 99 mg/dL (ref 70–99)

## 2020-06-01 LAB — BASIC METABOLIC PANEL
Anion gap: 6 (ref 5–15)
BUN: 24 mg/dL — ABNORMAL HIGH (ref 8–23)
CO2: 31 mmol/L (ref 22–32)
Calcium: 8.3 mg/dL — ABNORMAL LOW (ref 8.9–10.3)
Chloride: 97 mmol/L — ABNORMAL LOW (ref 98–111)
Creatinine, Ser: 1.29 mg/dL — ABNORMAL HIGH (ref 0.61–1.24)
GFR, Estimated: 60 mL/min — ABNORMAL LOW (ref >60)
Glucose, Bld: 124 mg/dL — ABNORMAL HIGH (ref 70–99)
Potassium: 4.4 mmol/L (ref 3.5–5.1)
Sodium: 134 mmol/L — ABNORMAL LOW (ref 135–145)

## 2020-06-01 LAB — CBC
HCT: 24 % — ABNORMAL LOW (ref 39.0–52.0)
Hemoglobin: 7.7 g/dL — ABNORMAL LOW (ref 13.0–17.0)
MCH: 29.5 pg (ref 26.0–34.0)
MCHC: 32.1 g/dL (ref 30.0–36.0)
MCV: 92 fL (ref 80.0–100.0)
Platelets: 95 10*3/uL — ABNORMAL LOW (ref 150–400)
RBC: 2.61 MIL/uL — ABNORMAL LOW (ref 4.22–5.81)
RDW: 14.6 % (ref 11.5–15.5)
WBC: 10 10*3/uL (ref 4.0–10.5)
nRBC: 0 % (ref 0.0–0.2)

## 2020-06-01 MED ORDER — ASPIRIN EC 81 MG PO TBEC
81.0000 mg | DELAYED_RELEASE_TABLET | Freq: Every day | ORAL | Status: DC
  Administered 2020-06-01 – 2020-06-06 (×6): 81 mg via ORAL
  Filled 2020-06-01 (×5): qty 1

## 2020-06-01 MED FILL — Sodium Chloride IV Soln 0.9%: INTRAVENOUS | Qty: 4000 | Status: AC

## 2020-06-01 MED FILL — Thrombin For Soln 5000 Unit: CUTANEOUS | Qty: 5000 | Status: AC

## 2020-06-01 MED FILL — Heparin Sodium (Porcine) Inj 1000 Unit/ML: INTRAMUSCULAR | Qty: 30 | Status: AC

## 2020-06-01 MED FILL — Lidocaine HCl Local Preservative Free (PF) Inj 2%: INTRAMUSCULAR | Qty: 15 | Status: AC

## 2020-06-01 MED FILL — Heparin Sodium (Porcine) Inj 1000 Unit/ML: INTRAMUSCULAR | Qty: 20 | Status: AC

## 2020-06-01 MED FILL — Sodium Bicarbonate IV Soln 8.4%: INTRAVENOUS | Qty: 100 | Status: AC

## 2020-06-01 MED FILL — Lidocaine HCl Local Soln Prefilled Syringe 100 MG/5ML (2%): INTRAMUSCULAR | Qty: 5 | Status: AC

## 2020-06-01 MED FILL — Electrolyte-R (PH 7.4) Solution: INTRAVENOUS | Qty: 6000 | Status: AC

## 2020-06-01 MED FILL — Calcium Chloride Inj 10%: INTRAVENOUS | Qty: 10 | Status: AC

## 2020-06-01 MED FILL — Mannitol IV Soln 20%: INTRAVENOUS | Qty: 500 | Status: AC

## 2020-06-01 NOTE — Anesthesia Postprocedure Evaluation (Signed)
Anesthesia Post Note  Patient: Juan Wagner  Procedure(s) Performed: REPAIR OF ACUTE ASCENDING THORACIC AORTIC DISSECTION USING HEMASHIELD PLATINUM WOVEN DOUBLE VELOUR VASCULAR GRAFT 32 MM X 30 CM (N/A ) TRANSESOPHAGEAL ECHOCARDIOGRAM (TEE) (N/A )     Patient location during evaluation: SICU Anesthesia Type: General Level of consciousness: sedated Pain management: pain level controlled Vital Signs Assessment: post-procedure vital signs reviewed and stable Respiratory status: patient remains intubated per anesthesia plan Cardiovascular status: stable Postop Assessment: no apparent nausea or vomiting Anesthetic complications: no   No complications documented.  Last Vitals:  Vitals:   06/01/20 0800 06/01/20 0900  BP: 115/75 131/80  Pulse: 82 82  Resp: 16 20  Temp:    SpO2: 96% 93%    Last Pain:  Vitals:   06/01/20 0800  TempSrc:   PainSc: 0-No pain                 Keoni Risinger

## 2020-06-01 NOTE — Progress Notes (Signed)
Pt arrived to 4e from 2h. Pt oriented room and staff. Vitals obtained and stable. Telemetry box applied and CCMD notified x2 verifiers.

## 2020-06-01 NOTE — Progress Notes (Signed)
CARDIAC REHAB PHASE I   PRE:  Rate/Rhythm: up with RN    BP: sitting     SaO2:   MODE:  Ambulation: 660 ft   POST:  Rate/Rhythm: 94 SR with PACs    BP: sitting 112/94     SaO2: 93 3L  Pt up with EVA and RN, I took over for RN. Slow and steady, no c/o. Happy to ambulate. To recliner, VSS. Practiced IS, 500 mL. Son present. Encouraged another walk this evening. Pt very motivated. Ashley, ACSM 06/01/2020 2:00 PM

## 2020-06-01 NOTE — Progress Notes (Signed)
Spoke with April RN and she states [employer] has been removed.

## 2020-06-01 NOTE — Progress Notes (Signed)
3 Days Post-Op Procedure(s) (LRB): REPAIR OF ACUTE ASCENDING THORACIC AORTIC DISSECTION USING HEMASHIELD PLATINUM WOVEN DOUBLE VELOUR VASCULAR GRAFT 32 MM X 30 CM (N/A) TRANSESOPHAGEAL ECHOCARDIOGRAM (TEE) (N/A) Subjective: Feeling fine Objective: Vital signs in last 24 hours: Temp:  [98.3 F (36.8 C)-99.1 F (37.3 C)] 98.3 F (36.8 C) (11/11 0733) Pulse Rate:  [76-105] 82 (11/11 0800) Cardiac Rhythm: Normal sinus rhythm (11/11 0800) Resp:  [0-21] 16 (11/11 0800) BP: (103-136)/(59-84) 115/75 (11/11 0800) SpO2:  [93 %-100 %] 96 % (11/11 0800) Weight:  [86.1 kg] 86.1 kg (11/11 0657)  Hemodynamic parameters for last 24 hours:    Intake/Output from previous day: 11/10 0701 - 11/11 0700 In: -  Out: 2575 [Urine:2405; Chest Tube:170] Intake/Output this shift: No intake/output data recorded.  General appearance: alert and cooperative Neurologic: intact Heart: regular rate and rhythm, S1, S2 normal, no murmur, click, rub or gallop Lungs: clear to auscultation bilaterally Abdomen: soft, non-tender; bowel sounds normal; no masses,  no organomegaly Extremities: extremities normal, atraumatic, no cyanosis or edema Wound: dressed  Lab Results: Recent Labs    05/31/20 0226 06/01/20 0404  WBC 9.6 10.0  HGB 8.5* 7.7*  HCT 25.9* 24.0*  PLT 81* 95*   BMET:  Recent Labs    05/31/20 0226 06/01/20 0404  NA 135 134*  K 4.6 4.4  CL 102 97*  CO2 26 31  GLUCOSE 128* 124*  BUN 22 24*  CREATININE 1.22 1.29*  CALCIUM 8.2* 8.3*    PT/INR:  Recent Labs    05/29/20 2152  LABPROT 12.3  INR 1.0   ABG    Component Value Date/Time   PHART 7.381 05/30/2020 0901   HCO3 24.0 05/30/2020 0901   TCO2 25 05/30/2020 0901   ACIDBASEDEF 1.0 05/30/2020 0901   O2SAT 93.0 05/30/2020 0901   CBG (last 3)  Recent Labs    05/31/20 1943 06/01/20 0008 06/01/20 0405  GLUCAP 166* 132* 105*    Assessment/Plan: S/P Procedure(s) (LRB): REPAIR OF ACUTE ASCENDING THORACIC AORTIC DISSECTION  USING HEMASHIELD PLATINUM WOVEN DOUBLE VELOUR VASCULAR GRAFT 32 MM X 30 CM (N/A) TRANSESOPHAGEAL ECHOCARDIOGRAM (TEE) (N/A) Mobilize Diuresis See progression orders   LOS: 3 days    Wonda Olds 06/01/2020

## 2020-06-01 NOTE — Progress Notes (Signed)
EVENING ROUNDS NOTE :     [address]       [place] [address]             [phone]                 3 Days Post-Op Procedure(s) (LRB): REPAIR OF ACUTE ASCENDING THORACIC AORTIC DISSECTION USING HEMASHIELD PLATINUM WOVEN DOUBLE VELOUR VASCULAR GRAFT 32 MM X 30 CM (N/A) TRANSESOPHAGEAL ECHOCARDIOGRAM (TEE) (N/A)   Total Length of Stay:  LOS: 3 days  Events:   No events     BP 111/71   Pulse 84   Temp 97.8 F (36.6 C) (Oral)   Resp (!) 23   Ht 5\' 9"  (1.753 m)   Wt 86.1 kg   SpO2 92%   BMI 28.03 kg/m         . lactated ringers      I/O last 3 completed shifts: In: 627.8 [I.V.:528; IV Piggyback:99.8] Out: 3775 [Urine:3125; Chest Tube:650]   CBC Latest Ref Rng & Units 06/01/2020 05/31/2020 05/30/2020  WBC 4.0 - 10.5 K/uL 10.0 9.6 9.5  Hemoglobin 13.0 - 17.0 g/dL 7.7(L) 8.5(L) 9.1(L)  Hematocrit 39 - 52 % 24.0(L) 25.9(L) 27.3(L)  Platelets 150 - 400 K/uL 95(L) 81(L) 93(L)    BMP Latest Ref Rng & Units 06/01/2020 05/31/2020 05/30/2020  Glucose 70 - 99 mg/dL 124(H) 128(H) 191(H)  BUN 8 - 23 mg/dL 24(H) 22 21  Creatinine 0.61 - 1.24 mg/dL 1.29(H) 1.22 1.54(H)  Sodium 135 - 145 mmol/L 134(L) 135 139  Potassium 3.5 - 5.1 mmol/L 4.4 4.6 4.5  Chloride 98 - 111 mmol/L 97(L) 102 107  CO2 22 - 32 mmol/L 31 26 22   Calcium 8.9 - 10.3 mg/dL 8.3(L) 8.2(L) 8.1(L)    ABG    Component Value Date/Time   PHART 7.381 05/30/2020 0901   PCO2ART 41.0 05/30/2020 0901   PO2ART 74 (L) 05/30/2020 0901   HCO3 24.0 05/30/2020 0901   TCO2 25 05/30/2020 0901   ACIDBASEDEF 1.0 05/30/2020 0901   O2SAT 93.0 05/30/2020 0901       Melodie Bouillon, MD 06/01/2020 5:06 PM

## 2020-06-02 ENCOUNTER — Inpatient Hospital Stay (HOSPITAL_COMMUNITY): Payer: Medicare Other

## 2020-06-02 LAB — BPAM RBC
Blood Product Expiration Date: 202111202359
Blood Product Expiration Date: 202112012359
Blood Product Expiration Date: 202112032359
Blood Product Expiration Date: 202112042359
Blood Product Expiration Date: 202112042359
Blood Product Expiration Date: 202112042359
Blood Product Expiration Date: 202112042359
Blood Product Expiration Date: 202112042359
Blood Product Expiration Date: 202112042359
ISSUE DATE / TIME: 202111020107
ISSUE DATE / TIME: 202111081331
ISSUE DATE / TIME: 202111081331
ISSUE DATE / TIME: 202111081331
ISSUE DATE / TIME: 202111081331
ISSUE DATE / TIME: 202111090527
ISSUE DATE / TIME: 202111090527
ISSUE DATE / TIME: 202111091806
ISSUE DATE / TIME: 202111111530
Unit Type and Rh: 7300
Unit Type and Rh: 7300
Unit Type and Rh: 7300
Unit Type and Rh: 7300
Unit Type and Rh: 7300
Unit Type and Rh: 7300
Unit Type and Rh: 7300
Unit Type and Rh: 7300
Unit Type and Rh: 7300

## 2020-06-02 LAB — CBC
HCT: 26 % — ABNORMAL LOW (ref 39.0–52.0)
Hemoglobin: 8.4 g/dL — ABNORMAL LOW (ref 13.0–17.0)
MCH: 29.1 pg (ref 26.0–34.0)
MCHC: 32.3 g/dL (ref 30.0–36.0)
MCV: 90 fL (ref 80.0–100.0)
Platelets: 122 10*3/uL — ABNORMAL LOW (ref 150–400)
RBC: 2.89 MIL/uL — ABNORMAL LOW (ref 4.22–5.81)
RDW: 14.1 % (ref 11.5–15.5)
WBC: 9.9 10*3/uL (ref 4.0–10.5)
nRBC: 0 % (ref 0.0–0.2)

## 2020-06-02 LAB — TYPE AND SCREEN
ABO/RH(D): B POS
Antibody Screen: NEGATIVE
Unit division: 0
Unit division: 0
Unit division: 0
Unit division: 0
Unit division: 0
Unit division: 0
Unit division: 0
Unit division: 0
Unit division: 0

## 2020-06-02 LAB — BASIC METABOLIC PANEL
Anion gap: 12 (ref 5–15)
BUN: 22 mg/dL (ref 8–23)
CO2: 24 mmol/L (ref 22–32)
Calcium: 8.3 mg/dL — ABNORMAL LOW (ref 8.9–10.3)
Chloride: 98 mmol/L (ref 98–111)
Creatinine, Ser: 1.23 mg/dL (ref 0.61–1.24)
GFR, Estimated: >60 mL/min (ref >60)
Glucose, Bld: 138 mg/dL — ABNORMAL HIGH (ref 70–99)
Potassium: 4.2 mmol/L (ref 3.5–5.1)
Sodium: 134 mmol/L — ABNORMAL LOW (ref 135–145)

## 2020-06-02 LAB — ECHO INTRAOPERATIVE TEE
AR max vel: 2.17 cm2
AV Area VTI: 1.28 cm2
AV Area mean vel: 2.14 cm2
AV Mean grad: 2 mmHg
AV Peak grad: 4 mmHg
Ao pk vel: 1 m/s

## 2020-06-02 LAB — GLUCOSE, CAPILLARY
Glucose-Capillary: 111 mg/dL — ABNORMAL HIGH (ref 70–99)
Glucose-Capillary: 118 mg/dL — ABNORMAL HIGH (ref 70–99)
Glucose-Capillary: 132 mg/dL — ABNORMAL HIGH (ref 70–99)
Glucose-Capillary: 137 mg/dL — ABNORMAL HIGH (ref 70–99)
Glucose-Capillary: 149 mg/dL — ABNORMAL HIGH (ref 70–99)

## 2020-06-02 MED ORDER — HYDROCHLOROTHIAZIDE 12.5 MG PO CAPS
12.5000 mg | ORAL_CAPSULE | Freq: Every day | ORAL | Status: DC
  Administered 2020-06-02 – 2020-06-06 (×5): 12.5 mg via ORAL
  Filled 2020-06-02 (×5): qty 1

## 2020-06-02 MED ORDER — AMIODARONE HCL IN DEXTROSE 360-4.14 MG/200ML-% IV SOLN
30.0000 mg/h | INTRAVENOUS | Status: DC
Start: 2020-06-02 — End: 2020-06-02

## 2020-06-02 MED ORDER — METOPROLOL TARTRATE 25 MG PO TABS
25.0000 mg | ORAL_TABLET | Freq: Two times a day (BID) | ORAL | Status: DC
  Administered 2020-06-02: 25 mg via ORAL
  Filled 2020-06-02: qty 1

## 2020-06-02 MED ORDER — AMIODARONE HCL IN DEXTROSE 360-4.14 MG/200ML-% IV SOLN
30.0000 mg/h | INTRAVENOUS | Status: AC
  Administered 2020-06-02: 30 mg/h via INTRAVENOUS
  Filled 2020-06-02 (×2): qty 200

## 2020-06-02 MED ORDER — METOPROLOL TARTRATE 12.5 MG HALF TABLET
12.5000 mg | ORAL_TABLET | Freq: Once | ORAL | Status: AC
  Administered 2020-06-02: 12.5 mg via ORAL
  Filled 2020-06-02: qty 1

## 2020-06-02 MED ORDER — METOPROLOL TARTRATE 25 MG/10 ML ORAL SUSPENSION: 12.5000 mg | Freq: Two times a day (BID) | ORAL | Status: DC

## 2020-06-02 MED ORDER — ACETAMINOPHEN 500 MG PO TABS
1000.0000 mg | ORAL_TABLET | Freq: Four times a day (QID) | ORAL | Status: AC
  Administered 2020-06-02 – 2020-06-03 (×6): 1000 mg via ORAL
  Filled 2020-06-02 (×6): qty 2

## 2020-06-02 MED ORDER — AMIODARONE HCL 200 MG PO TABS
400.0000 mg | ORAL_TABLET | Freq: Two times a day (BID) | ORAL | Status: DC
  Administered 2020-06-02 – 2020-06-06 (×9): 400 mg via ORAL
  Filled 2020-06-02 (×9): qty 2

## 2020-06-02 MED ORDER — POTASSIUM CHLORIDE CRYS ER 20 MEQ PO TBCR
20.0000 meq | EXTENDED_RELEASE_TABLET | Freq: Two times a day (BID) | ORAL | Status: DC
  Administered 2020-06-02 (×2): 20 meq via ORAL
  Filled 2020-06-02 (×2): qty 1

## 2020-06-02 MED ORDER — AMIODARONE HCL IN DEXTROSE 360-4.14 MG/200ML-% IV SOLN: 30.0000 mg/h | INTRAVENOUS | Status: DC

## 2020-06-02 NOTE — Progress Notes (Signed)
   06/02/20 0948  Assess: MEWS Score  Temp 98.3 F (36.8 C)  BP (!) 120/96  Pulse Rate (!) 129  ECG Heart Rate (!) 129  Resp 18  Level of Consciousness Alert  SpO2 96 %  O2 Device Nasal Cannula  O2 Flow Rate (L/min) 3 L/min  Assess: MEWS Score  MEWS Temp 0  MEWS Systolic 0  MEWS Pulse 2  MEWS RR 0  MEWS LOC 0  MEWS Score 2  MEWS Score Color Yellow  Assess: if the MEWS score is Yellow or Red  Were vital signs taken at a resting state? Yes  Focused Assessment No change from prior assessment  Early Detection of Sepsis Score *See Row Information* Medium  MEWS guidelines implemented *See Row Information* Yes  Treat  MEWS Interventions Escalated (See documentation below) (provider Jadene Pierini, PA and Charge RN [place] notified)  Pain Scale 0-10  Pain Score 0  Take Vital Signs  Increase Vital Sign Frequency  Yellow: Q 2hr X 2 then Q 4hr X 2, if remains yellow, continue Q 4hrs  Escalate  MEWS: Escalate Yellow: discuss with charge nurse/RN and consider discussing with provider and RRT  Notify: Charge Nurse/RN  Name of Charge Nurse/RN Notified Nego, RN  Date Charge Nurse/RN Notified 06/02/20  Time Charge Nurse/RN Notified 2585  Notify: Provider  Provider Name/Title Jadene Pierini, PA-C  Date Provider Notified 06/02/20  Time Provider Notified [phone]  Notification Type Page  Notification Reason Change in status (sinus tach)  Response See new orders (additional dose 12.5 mg metroprolol po)  Date of Provider Response 06/02/20  Time of Provider Response 0950  Notify: Rapid Response  Name of Rapid Response RN Notified  (not indicated, per provider and charge nurse)  Date Rapid Response Notified  (not indicated, per provider and charge nurse)  Time Rapid Response Notified  (not indicated, per provider and charge nurse)  Document  Patient Outcome Other (Comment) (continue to monitor and keep provider updated)

## 2020-06-02 NOTE — Progress Notes (Signed)
Came to ambulate however HR noted to be 129-130 ?ST. Pt asx. BP 120/96. RN notified, will hold ambulation for now. [facility], ACSM 9:50 AM 06/02/2020

## 2020-06-02 NOTE — Progress Notes (Signed)
CARDIAC REHAB PHASE I   Went to offer to walk with pt. Pts HR noted to be in the 130-140s. Will hold ambulation at this time. Will continue to follow.  Rufina Falco, RN BSN 06/02/2020 2:51 PM

## 2020-06-02 NOTE — Progress Notes (Signed)
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 USING HEMASHIELD PLATINUM WOVEN DOUBLE VELOUR VASCULAR GRAFT 32 MM X 30 CM (N/A) TRANSESOPHAGEAL ECHOCARDIOGRAM (TEE) (N/A) Subjective: Some SOB at times  Objective: Vital signs in last 24 hours: Temp:  [97.7 F (36.5 C)-99.7 F (37.6 C)] 97.7 F (36.5 C) (11/12 0828) Pulse Rate:  [81-108] 88 (11/12 0828) Cardiac Rhythm: Heart block (11/11 1900) Resp:  [3-26] 20 (11/12 0828) BP: (107-137)/(71-98) 124/81 (11/12 0828) SpO2:  [90 %-97 %] 95 % (11/12 0828) Weight:  [87.8 kg] 87.8 kg (11/12 0624)  Hemodynamic parameters for last 24 hours:    Intake/Output from previous day: 11/11 0701 - 11/12 0700 In: 450 [P.O.:450] Out: 2950 [Urine:2950] Intake/Output this shift: Total I/O In: 360 [P.O.:360] Out: -   General appearance: alert, cooperative and no distress Heart: regular rate and rhythm Lungs: dim bilat bases Abdomen: benign Extremities: no edema Wound: incis healing well  Lab Results: Recent Labs    06/01/20 0404 06/02/20 0140  WBC 10.0 9.9  HGB 7.7* 8.4*  HCT 24.0* 26.0*  PLT 95* 122*   BMET:  Recent Labs    06/01/20 0404 06/02/20 0140  NA 134* 134*  K 4.4 4.2  CL 97* 98  CO2 31 24  GLUCOSE 124* 138*  BUN 24* 22  CREATININE 1.29* 1.23  CALCIUM 8.3* 8.3*    PT/INR: No results for input(s): LABPROT, INR in the last 72 hours. ABG    Component Value Date/Time   PHART 7.381 05/30/2020 0901   HCO3 24.0 05/30/2020 0901   TCO2 25 05/30/2020 0901   ACIDBASEDEF 1.0 05/30/2020 0901   O2SAT 93.0 05/30/2020 0901   CBG (last 3)  Recent Labs    06/02/20 0003 06/02/20 0410 06/02/20 0832  GLUCAP 132* 118* 137*    Meds Scheduled Meds: . acetaminophen  1,000 mg Oral Q6H   Or  . acetaminophen (TYLENOL) oral liquid 160 mg/5 mL  1,000 mg Per Tube Q6H  . aspirin EC  81 mg  Oral Daily  . bisacodyl  10 mg Oral Daily   Or  . bisacodyl  10 mg Rectal Daily  . Chlorhexidine Gluconate Cloth  6 each Topical Daily  . colchicine  0.3 mg Oral BID  . docusate sodium  200 mg Oral Daily  . furosemide  40 mg Intravenous BID  . insulin aspart  0-24 Units Subcutaneous Q4H  . mouth rinse  15 mL Mouth Rinse BID  . metoprolol tartrate  12.5 mg Oral BID   Or  . metoprolol tartrate  12.5 mg Per Tube BID  . oxcarbazepine  600 mg Oral BID  . pantoprazole  40 mg Oral Daily   Continuous Infusions: . lactated ringers     PRN Meds:.lactated ringers, metoprolol tartrate, morphine injection, ondansetron (ZOFRAN) IV, oxyCODONE, sodium chloride flush, traMADol  Xrays DG Chest 1 View  Result Date: 06/01/2020 CLINICAL DATA:  Status post repair of ascending thoracic aortic dissection. EXAM: CHEST  1 VIEW COMPARISON:  Chest x-ray 05/31/2020 FINDINGS: The right IJ Cordis is stable. Stable mild cardiac enlargement. Persistent patchy areas of atelectasis and small bilateral pleural effusions. No pneumothorax. IMPRESSION: Persistent patchy areas of atelectasis and small bilateral pleural effusions. Electronically Signed   By: Marijo Sanes M.D.   On: 06/01/2020 06:45  DG Chest 2 View  Result Date: 06/02/2020 CLINICAL DATA:  69 year old male status post type a dissection repair EXAM: CHEST - 2 VIEW COMPARISON:  06/01/2020 FINDINGS: Cardiomediastinal silhouette is unchanged. Surgical changes of median sternotomy. Interval removal of right IJ sheath. Graded opacities at the bilateral lung bases, corresponding to the meniscus at the posterior lung base on the lateral view. No pneumothorax. No interlobular septal thickening. IMPRESSION: Bilateral pleural effusions and associated atelectasis/consolidation. Interval removal of right IJ sheath. Surgical changes of median sternotomy. Electronically Signed   By: Corrie Mckusick D.O.   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

## 2020-06-03 ENCOUNTER — Inpatient Hospital Stay (HOSPITAL_COMMUNITY): Payer: Medicare Other

## 2020-06-03 ENCOUNTER — Encounter (HOSPITAL_COMMUNITY): Payer: Self-pay | Admitting: Cardiothoracic Surgery

## 2020-06-03 LAB — BASIC METABOLIC PANEL
Anion gap: 10 (ref 5–15)
Anion gap: 10 (ref 5–15)
BUN: 22 mg/dL (ref 8–23)
BUN: 19 mg/dL (ref 8–23)
CO2: 29 mmol/L (ref 22–32)
CO2: 31 mmol/L (ref 22–32)
Calcium: 8.2 mg/dL — ABNORMAL LOW (ref 8.9–10.3)
Calcium: 8.6 mg/dL — ABNORMAL LOW (ref 8.9–10.3)
Chloride: 92 mmol/L — ABNORMAL LOW (ref 98–111)
Chloride: 92 mmol/L — ABNORMAL LOW (ref 98–111)
Creatinine, Ser: 1.14 mg/dL (ref 0.61–1.24)
Creatinine, Ser: 1.18 mg/dL (ref 0.61–1.24)
GFR, Estimated: >60 mL/min (ref >60)
GFR, Estimated: >60 mL/min (ref >60)
Glucose, Bld: 125 mg/dL — ABNORMAL HIGH (ref 70–99)
Glucose, Bld: 151 mg/dL — ABNORMAL HIGH (ref 70–99)
Potassium: 3.8 mmol/L (ref 3.5–5.1)
Potassium: 3.6 mmol/L (ref 3.5–5.1)
Sodium: 131 mmol/L — ABNORMAL LOW (ref 135–145)
Sodium: 133 mmol/L — ABNORMAL LOW (ref 135–145)

## 2020-06-03 LAB — CBC
HCT: 27.2 % — ABNORMAL LOW (ref 39.0–52.0)
Hemoglobin: 8.9 g/dL — ABNORMAL LOW (ref 13.0–17.0)
MCH: 28.7 pg (ref 26.0–34.0)
MCHC: 32.7 g/dL (ref 30.0–36.0)
MCV: 87.7 fL (ref 80.0–100.0)
Platelets: 242 10*3/uL (ref 150–400)
RBC: 3.1 MIL/uL — ABNORMAL LOW (ref 4.22–5.81)
RDW: 13.8 % (ref 11.5–15.5)
WBC: 10.6 10*3/uL — ABNORMAL HIGH (ref 4.0–10.5)
nRBC: 0 % (ref 0.0–0.2)

## 2020-06-03 LAB — GLUCOSE, CAPILLARY
Glucose-Capillary: 104 mg/dL — ABNORMAL HIGH (ref 70–99)
Glucose-Capillary: 110 mg/dL — ABNORMAL HIGH (ref 70–99)
Glucose-Capillary: 115 mg/dL — ABNORMAL HIGH (ref 70–99)
Glucose-Capillary: 96 mg/dL (ref 70–99)

## 2020-06-03 MED ORDER — LIDOCAINE HCL (PF) 1 % IJ SOLN
INTRAMUSCULAR | Status: AC
  Filled 2020-06-03: qty 30

## 2020-06-03 MED ORDER — POTASSIUM CHLORIDE CRYS ER 20 MEQ PO TBCR
20.0000 meq | EXTENDED_RELEASE_TABLET | Freq: Every day | ORAL | Status: DC
  Administered 2020-06-03 – 2020-06-06 (×4): 20 meq via ORAL
  Filled 2020-06-03 (×4): qty 1

## 2020-06-03 MED ORDER — FUROSEMIDE 40 MG PO TABS
40.0000 mg | ORAL_TABLET | Freq: Every day | ORAL | Status: DC
  Administered 2020-06-03 – 2020-06-06 (×4): 40 mg via ORAL
  Filled 2020-06-03 (×4): qty 1

## 2020-06-03 MED ORDER — POTASSIUM CHLORIDE CRYS ER 20 MEQ PO TBCR
40.0000 meq | EXTENDED_RELEASE_TABLET | Freq: Once | ORAL | Status: AC
  Administered 2020-06-03: 40 meq via ORAL
  Filled 2020-06-03: qty 2

## 2020-06-03 MED ORDER — METOPROLOL TARTRATE 25 MG PO TABS
37.5000 mg | ORAL_TABLET | Freq: Two times a day (BID) | ORAL | Status: DC
  Administered 2020-06-03 – 2020-06-06 (×7): 37.5 mg via ORAL
  Filled 2020-06-03 (×7): qty 1

## 2020-06-03 NOTE — Procedures (Signed)
PROCEDURE SUMMARY:  Successful US guided left thoracentesis. Yielded 1 L of blood. Patient tolerated procedure well. No immediate complications. EBL = trace  Post procedure chest X-ray pending  Talyia Allende S Chelise Hanger PA-C 06/03/2020 12:39 PM

## 2020-06-03 NOTE — Progress Notes (Signed)
CARDIAC REHAB PHASE I   Went to offer to walk with pt. Pt out of room for procedure. Will f/u as time allows to encourage ambulation.  Rufina Falco, RN BSN 06/03/2020 11:39 AM

## 2020-06-03 NOTE — Progress Notes (Addendum)
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 USING HEMASHIELD PLATINUM WOVEN DOUBLE VELOUR VASCULAR GRAFT 32 MM X 30 CM (N/A) TRANSESOPHAGEAL ECHOCARDIOGRAM (TEE) (N/A) Subjective: No specific c/o- denies SOB or palpitations HR remains elevated  Objective: Vital signs in last 24 hours: Temp:  [97.8 F (36.6 C)-99.1 F (37.3 C)] 98.1 F (36.7 C) (11/13 0746) Pulse Rate:  [120-131] 121 (11/13 0746) Cardiac Rhythm: Sinus tachycardia (11/13 0746) Resp:  [13-23] 20 (11/13 0746) BP: (105-134)/(76-101) 120/101 (11/13 0746) SpO2:  [91 %-97 %] 97 % (11/13 0746) Weight:  [86.7 kg] 86.7 kg (11/13 0636)  Hemodynamic parameters for last 24 hours:    Intake/Output from previous day: 11/12 0701 - 11/13 0700 In: 760 [P.O.:760] Out: 2175 [Urine:2175] Intake/Output this shift: Total I/O In: -  Out: 50 [Urine:50]  General appearance: alert, cooperative and no distress Heart: regular rate and rhythm and tachy Lungs: dim in bilat lower fields Abdomen: benign Extremities: min edema Wound: incis healing well  Lab Results: Recent Labs    06/01/20 0404 06/02/20 0140  WBC 10.0 9.9  HGB 7.7* 8.4*  HCT 24.0* 26.0*  PLT 95* 122*   BMET:  Recent Labs    06/02/20 0140 06/03/20 0229  NA 134* 131*  K 4.2 3.8  CL 98 92*  CO2 24 29  GLUCOSE 138* 125*  BUN 22 22  CREATININE 1.23 1.14  CALCIUM 8.3* 8.2*    PT/INR: No results for input(s): LABPROT, INR in the last 72 hours. ABG    Component Value Date/Time   PHART 7.381 05/30/2020 0901   HCO3 24.0 05/30/2020 0901   TCO2 25 05/30/2020 0901   ACIDBASEDEF 1.0 05/30/2020 0901   O2SAT 93.0 05/30/2020 0901   CBG (last 3)  Recent Labs    06/03/20 0014 06/03/20 0401 06/03/20 0903  GLUCAP 110* 104* 115*    Meds Scheduled Meds: . acetaminophen  1,000 mg Oral Q6H  . amiodarone  400 mg Oral  BID  . aspirin EC  81 mg Oral Daily  . bisacodyl  10 mg Rectal Daily  . Chlorhexidine Gluconate Cloth  6 each Topical Daily  . colchicine  0.3 mg Oral BID  . docusate sodium  200 mg Oral Daily  . furosemide  40 mg Intravenous BID  . hydrochlorothiazide  12.5 mg Oral Daily  . insulin aspart  0-24 Units Subcutaneous Q4H  . mouth rinse  15 mL Mouth Rinse BID  . metoprolol tartrate  25 mg Oral BID  . oxcarbazepine  600 mg Oral BID  . pantoprazole  40 mg Oral Daily  . potassium chloride  20 mEq Oral BID   Continuous Infusions: . lactated ringers     PRN Meds:.lactated ringers, metoprolol tartrate, ondansetron (ZOFRAN) IV, oxyCODONE, sodium chloride flush, traMADol  Xrays DG Chest 2 View  Result Date: 06/03/2020 CLINICAL DATA:  Pleural effusion. EXAM: CHEST - 2 VIEW COMPARISON:  06/02/2020 FINDINGS: 0616 hours. The cardio pericardial silhouette is enlarged. Left greater than right bibasilar atelectasis/infiltrate with small pleural effusions, left greater than right. Telemetry leads overlie the chest. IMPRESSION: No substantial change. Bibasilar atelectasis/infiltrate with small pleural effusions, left greater than right. Electronically Signed   By: Misty Stanley M.D.   On: 06/03/2020 08:32   DG Chest 2 View  Result Date:  06/02/2020 CLINICAL DATA:  69 year old male status post type a dissection repair EXAM: CHEST - 2 VIEW COMPARISON:  06/01/2020 FINDINGS: Cardiomediastinal silhouette is unchanged. Surgical changes of median sternotomy. Interval removal of right IJ sheath. Graded opacities at the bilateral lung bases, corresponding to the meniscus at the posterior lung base on the lateral view. No pneumothorax. No interlobular septal thickening. IMPRESSION: Bilateral pleural effusions and associated atelectasis/consolidation. Interval removal of right IJ sheath. Surgical changes of median sternotomy. Electronically Signed   By: Corrie Mckusick D.O.   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  BP control is good. Will reduce lasix as could be a bit intravasc dry 2 sats ok on 3 liters 3 CXR is stable, Left>right effus, may need to consider L thoracentesis if unable to get off O2 4 no new labs- will repeat in am and now 5 rehab/pulm toilet as able  LOS: 5 days    John Giovanni PA-C Pager [phone] 06/03/2020 Patient seen and examined, agree with above Was in A flutter earlier, now in SR at 90 bpm Will see if IR can tap left effusion  Remo Lipps C. Roxan Hockey, MD [facility] [phone]

## 2020-06-04 LAB — BASIC METABOLIC PANEL
Anion gap: 11 (ref 5–15)
BUN: 24 mg/dL — ABNORMAL HIGH (ref 8–23)
CO2: 30 mmol/L (ref 22–32)
Calcium: 8.4 mg/dL — ABNORMAL LOW (ref 8.9–10.3)
Chloride: 92 mmol/L — ABNORMAL LOW (ref 98–111)
Creatinine, Ser: 1.22 mg/dL (ref 0.61–1.24)
GFR, Estimated: >60 mL/min (ref >60)
Glucose, Bld: 126 mg/dL — ABNORMAL HIGH (ref 70–99)
Potassium: 4.1 mmol/L (ref 3.5–5.1)
Sodium: 133 mmol/L — ABNORMAL LOW (ref 135–145)

## 2020-06-04 LAB — CBC
HCT: 24.3 % — ABNORMAL LOW (ref 39.0–52.0)
Hemoglobin: 7.9 g/dL — ABNORMAL LOW (ref 13.0–17.0)
MCH: 29 pg (ref 26.0–34.0)
MCHC: 32.5 g/dL (ref 30.0–36.0)
MCV: 89.3 fL (ref 80.0–100.0)
Platelets: 243 10*3/uL (ref 150–400)
RBC: 2.72 MIL/uL — ABNORMAL LOW (ref 4.22–5.81)
RDW: 14.3 % (ref 11.5–15.5)
WBC: 9.1 10*3/uL (ref 4.0–10.5)
nRBC: 0 % (ref 0.0–0.2)

## 2020-06-04 NOTE — Progress Notes (Signed)
Mobility Specialist: Progress Note   06/04/20 1506  Mobility  Activity Ambulated in hall  Level of Assistance Minimal assist, patient does 75% or more  Assistive Device Front wheel Stegmann  Distance Ambulated (ft) 150 ft  Mobility Response Tolerated well  Mobility performed by Mobility specialist  Bed Position Chair  $Mobility charge 1 Mobility   Post-Mobility on 2 L/min [place]: 81 HR, 123/78 BP, 99% SpO2  Pt seemed slightly SOB during ambulation but stated he was fine. Sats maintained in the high 90s.   [facility] Arisbel Maione Mobility Specialist

## 2020-06-04 NOTE — Progress Notes (Addendum)
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 USING HEMASHIELD PLATINUM WOVEN DOUBLE VELOUR VASCULAR GRAFT 32 MM X 30 CM (N/A) TRANSESOPHAGEAL ECHOCARDIOGRAM (TEE) (N/A) Subjective: SOB improved with thoracentesis  Objective: Vital signs in last 24 hours: Temp:  [98 F (36.7 C)-99.1 F (37.3 C)] 98.8 F (37.1 C) (11/14 0751) Pulse Rate:  [71-89] 83 (11/14 0751) Cardiac Rhythm: Normal sinus rhythm (11/14 0701) Resp:  [11-24] 18 (11/14 0751) BP: (100-128)/(63-84) 128/84 (11/14 0751) SpO2:  [92 %-100 %] 92 % (11/14 0751) Weight:  [84.7 kg] 84.7 kg (11/14 0427)  Hemodynamic parameters for last 24 hours:    Intake/Output from previous day: 11/13 0701 - 11/14 0700 In: 240 [P.O.:240] Out: 1800 [Urine:1800] Intake/Output this shift: No intake/output data recorded.  General appearance: alert, cooperative and no distress Heart: regular rate and rhythm Lungs: dim right base, some crackles in left base Abdomen: benign Extremities: no edema Wound: incis healing well  Lab Results: Recent Labs    06/03/20 1019 06/04/20 0157  WBC 10.6* 9.1  HGB 8.9* 7.9*  HCT 27.2* 24.3*  PLT 242 243   BMET:  Recent Labs    06/03/20 1019 06/04/20 0157  NA 133* 133*  K 3.6 4.1  CL 92* 92*  CO2 31 30  GLUCOSE 151* 126*  BUN 19 24*  CREATININE 1.18 1.22  CALCIUM 8.6* 8.4*    PT/INR: No results for input(s): LABPROT, INR in the last 72 hours. ABG    Component Value Date/Time   PHART 7.381 05/30/2020 0901   HCO3 24.0 05/30/2020 0901   TCO2 25 05/30/2020 0901   ACIDBASEDEF 1.0 05/30/2020 0901   O2SAT 93.0 05/30/2020 0901   CBG (last 3)  Recent Labs    06/03/20 0401 06/03/20 0903 06/03/20 1314  GLUCAP 104* 115* 96    Meds Scheduled Meds: . amiodarone  400 mg Oral BID  . aspirin EC  81 mg Oral Daily  . bisacodyl  10 mg Rectal Daily  .  Chlorhexidine Gluconate Cloth  6 each Topical Daily  . colchicine  0.3 mg Oral BID  . docusate sodium  200 mg Oral Daily  . furosemide  40 mg Oral Daily  . hydrochlorothiazide  12.5 mg Oral Daily  . mouth rinse  15 mL Mouth Rinse BID  . metoprolol tartrate  37.5 mg Oral BID  . oxcarbazepine  600 mg Oral BID  . pantoprazole  40 mg Oral Daily  . potassium chloride  20 mEq Oral Daily   Continuous Infusions: . lactated ringers     PRN Meds:.lactated ringers, metoprolol tartrate, ondansetron (ZOFRAN) IV, oxyCODONE, sodium chloride flush, traMADol  Xrays DG Chest 1 View  Result Date: 06/03/2020 CLINICAL DATA:  Postop EXAM: CHEST  1 VIEW COMPARISON:  June 03, 2020 FINDINGS: The cardiomediastinal silhouette is unchanged and enlarged in contour.Status post median sternotomy. Small RIGHT pleural effusion, unchanged. Resolution of LEFT-sided pleural effusion status post thoracocentesis. No pneumothorax. Scattered bibasilar atelectasis. Mild diffuse interstitial prominence. Visualized abdomen is unremarkable. Mild degenerative changes of the thoracic spine. IMPRESSION: 1. Small RIGHT pleural effusion, unchanged. 2. Resolution of LEFT-sided pleural effusion status post thoracocentesis. No pneumothorax. Electronically Signed   By: Valentino Saxon MD   On: 06/03/2020 14:24   DG Chest 2 View  Result Date: 06/03/2020 CLINICAL  DATA:  Pleural effusion. EXAM: CHEST - 2 VIEW COMPARISON:  06/02/2020 FINDINGS: 0616 hours. The cardio pericardial silhouette is enlarged. Left greater than right bibasilar atelectasis/infiltrate with small pleural effusions, left greater than right. Telemetry leads overlie the chest. IMPRESSION: No substantial change. Bibasilar atelectasis/infiltrate with small pleural effusions, left greater than right. Electronically Signed   By: Misty Stanley M.D.   On: 06/03/2020 08:32   US THORACENTESIS ASP PLEURAL SPACE W/IMG GUIDE  Result Date: 06/03/2020 INDICATION: Status post  repair of acute ascending thoracic aortic dissection. Right pleural effusion. Request for therapeutic thoracentesis. EXAM: ULTRASOUND GUIDED LEFT THORACENTESIS MEDICATIONS: 1% lidocaine 10 mL COMPLICATIONS: None immediate. PROCEDURE: An ultrasound guided thoracentesis was thoroughly discussed with the patient and questions answered. The benefits, risks, alternatives and complications were also discussed. The patient understands and wishes to proceed with the procedure. Written consent was obtained. Ultrasound was performed to localize and mark an adequate pocket of fluid in the left chest. The area was then prepped and draped in the normal sterile fashion. 1% Lidocaine was used for local anesthesia. Under ultrasound guidance a 6 Fr Safe-T-Centesis catheter was introduced. Thoracentesis was performed. The catheter was removed and a dressing applied. FINDINGS: A total of approximately 1 L of blood was removed. IMPRESSION: Successful ultrasound guided left thoracentesis yielding 1 L of blood. Read by: Gareth Eagle, PA-C Electronically Signed   By: Aletta Edouard M.D.   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 Post procedure CXR effusion resolved. effus on right is small, will recheck CXR /CBC in am 5 BS controlled 6 encouraged walking more- only getting up to bathroom, he seems a bit fearful to push himself per nursing 7 routine pulm toilet, some sputum production   LOS: 6 days    Gaspar Bidding Pager [phone] 06/04/2020 Patient seen and examined, agree with above  Remo Lipps C. Roxan Hockey, MD [facility] [phone]

## 2020-06-04 NOTE — Evaluation (Signed)
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 Had a thoracentesis performed as well.   PMH: seizures, HTN, sleep apnea, PSH: L knee scope  Clinical Impression  Pt admitted with above. Pt was walking 660' with cardiac rehab using EVA Schamp on Thursday however then had afib with RVR and tachycardia inhibiting ambulation the last 2 days. Pt presenting with decreased activity tolerance, 2/4 DOE, generalized weakness, min guard for mobility, and use of RW for safe ambution. Pt resting at 88% on RA, trialed amb however pulse ox wouldn't read and pt became weak in knees and needs to sit. Pt placed back on 2LO2 via [place]. Pt with good family support and family is placing a bed down on main floor to minimize stair negotiation. Pt will have to do 14 steps to get to a shower though. Pt and son educated on sternal precautions and in bed LE HEP. Acute PT to cont to follow.    Follow Up Recommendations Home health PT;Supervision/Assistance - 24 hour    Equipment Recommendations  Rolling [place] with 5" wheels (they may have one at home)    Recommendations for Other Services       Precautions / Restrictions Precautions Precautions: Fall Precaution Comments: sternal Restrictions Weight Bearing Restrictions: No Other Position/Activity Restrictions: sternal precautions      Mobility  Bed Mobility Overal bed mobility: Needs Assistance Bed Mobility: Rolling;Sidelying to Sit Rolling: Min guard Sidelying to sit: Min assist       General bed mobility comments: worked with HOB flat to mimic home, min guard for tactile cues for rolling, minA for trunk elevation to come up onto L elbow and push up    Transfers Overall transfer level: Needs assistance Equipment used: Rolling  [place] (2 wheeled) Transfers: Sit to/from Stand Sit to Stand: Min assist         General transfer comment: educated on rocking forward while holding onto heart pillow, min guard for safety however pt able to push up with LEs and no use of UEs  Ambulation/Gait Ambulation/Gait assistance: Min assist Gait Distance (Feet): 50 Feet (x2) Assistive device: Rolling M[place]oin (2 wheeled) Gait Pattern/deviations: Step-through pattern;Decreased stride length;Trunk flexed Gait velocity: slow Gait velocity interpretation: <1.31 ft/sec, indicative of household ambulator General Gait Details: pt slow and guarded, quick short breaths but denies SOB, pt with onset of bilat knee weakness instability requiring pt to sit in chair and rest. SpO2 with poor waveform and unable to read, unsure what it was on RA, pt placed back on 2Lo2 via [place] for walk back to room  Stairs            Wheelchair Mobility    Modified Rankin (Stroke Patients Only)       Balance Overall balance assessment: Needs assistance Sitting-balance support: Feet supported;No upper extremity supported Sitting balance-Leahy Scale: Fair     Standing balance support: No upper extremity supported Standing balance-Leahy Scale: Fair                               Pertinent Vitals/Pain Pain Assessment: 0-10 Pain Score: 1  Pain Location: general discomfort, will have an occasional sharp pain at 8/10 on L side Pain Intervention(s): Monitored during session    Home Living  Family/patient expects to be discharged to:: Private residence Living Arrangements: Spouse/significant other Available Help at Discharge: Family;Available 24 hours/day Type of Home: House Home Access: Stairs to enter Entrance Stairs-Rails: None Entrance Stairs-Number of Steps: 2 Home Layout: Two level;Able to live on main level with bedroom/bathroom Home Equipment: Shower seat - built in;Matto - 2 wheels;Cane - single point      Prior Function  Level of Independence: Independent         Comments: retired but volunteers with [employer]   Dominant Hand: Right    Extremity/Trunk Assessment   Upper Extremity Assessment Upper Extremity Assessment: Overall WFL for tasks assessed (limited by sternal prec, but able to use functionally)    Lower Extremity Assessment Lower Extremity Assessment: Generalized weakness    Cervical / Trunk Assessment Cervical / Trunk Assessment: Other exceptions Cervical / Trunk Exceptions: sternal incision  Communication   Communication: No difficulties  Cognition Arousal/Alertness: Awake/alert Behavior During Therapy: WFL for tasks assessed/performed Overall Cognitive Status: Within Functional Limits for tasks assessed                                        General Comments General comments (skin integrity, edema, etc.): pt with healing stenral incision, no drainage    Exercises General Exercises - Lower Extremity Ankle Circles/Pumps: AROM;Both;10 reps;Supine Quad Sets: AROM;Both;10 reps;Supine Gluteal Sets: AROM;Both;10 reps;Supine   Assessment/Plan    PT Assessment Patient needs continued PT services  PT Problem List Decreased strength;Decreased activity tolerance;Decreased balance;Decreased mobility;Decreased coordination;Decreased knowledge of use of DME;Decreased safety awareness       PT Treatment Interventions DME instruction;Gait training;Stair training;Functional mobility training;Therapeutic activities;Therapeutic exercise;Balance training;Neuromuscular re-education;Cognitive remediation    PT Goals (Current goals can be found in the Care Plan section)  Acute Rehab PT Goals Patient Stated Goal: didn't state PT Goal Formulation: With patient/family Time For Goal Achievement: 06/18/20 Potential to Achieve Goals: Good    Frequency Min 3X/week   Barriers to discharge Inaccessible home environment has 2 sets of 7 steps  to access shower    Co-evaluation               AM-PAC PT "6 Clicks" Mobility  Outcome Measure Help needed turning from your back to your side while in a flat bed without using bedrails?: A Little Help needed moving from lying on your back to sitting on the side of a flat bed without using bedrails?: A Little Help needed moving to and from a bed to a chair (including a wheelchair)?: A Little Help needed standing up from a chair using your arms (e.g., wheelchair or bedside chair)?: A Little Help needed to walk in hospital room?: A Little Help needed climbing 3-5 steps with a railing? : A Lot 6 Click Score: 17    End of Session Equipment Utilized During Treatment: Oxygen (2LO2 via [place]) Activity Tolerance: Patient limited by fatigue Patient left: in chair;with call bell/phone within reach;with family/visitor present Nurse Communication: Mobility status PT Visit Diagnosis: Unsteadiness on feet (R26.81);Muscle weakness (generalized) (M62.81);Difficulty in walking, not elsewhere classified (R26.2)    Time: 3818-2993 PT Time Calculation (min) (ACUTE ONLY): 45 min   Charges:   PT Evaluation $PT Eval Moderate Complexity: 1 Mod PT Treatments $Gait Training: 8-22 mins $Therapeutic Exercise: 8-22 mins        Kittie Plater, PT, DPT Acute Rehabilitation Services Pager #: [phone]  Office #: [phone]   Berline Lopes 06/04/2020, 11:58 AM

## 2020-06-05 ENCOUNTER — Inpatient Hospital Stay (HOSPITAL_COMMUNITY): Payer: Medicare Other

## 2020-06-05 LAB — CBC
HCT: 24.6 % — ABNORMAL LOW (ref 39.0–52.0)
Hemoglobin: 8.1 g/dL — ABNORMAL LOW (ref 13.0–17.0)
MCH: 29.2 pg (ref 26.0–34.0)
MCHC: 32.9 g/dL (ref 30.0–36.0)
MCV: 88.8 fL (ref 80.0–100.0)
Platelets: 297 10*3/uL (ref 150–400)
RBC: 2.77 MIL/uL — ABNORMAL LOW (ref 4.22–5.81)
RDW: 14.2 % (ref 11.5–15.5)
WBC: 11.5 10*3/uL — ABNORMAL HIGH (ref 4.0–10.5)
nRBC: 0.2 % (ref 0.0–0.2)

## 2020-06-05 NOTE — Progress Notes (Signed)
Physical Therapy Treatment Note  Notes: Pt up in chair having just walked with Cardiac Rehab. Focus of session stair training. Pt requests to use bathroom. Pt is limited in safe mobility by increased fatigue and increased O2 demand with mobility. Pt is min guard for transfers from recliner and BSC over toilet. Pt is min guard for ambulation in room. Given pt fatigue, opted for use of single step for stair training. Pt able to perform step ups onto step x 7 with min A to steady at the waist. D/c plans remain appropriate. PT will follow back tomorrow to perform stair training in stairwell.    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 Had a thoracentesis performed as well.   PMH: seizures, HTN, sleep apnea, PSH: L knee scope  Subjective Data  Patient Stated Goal didn't state  Precautions  Precautions Fall  Precaution Comments sternal  Restrictions  Weight Bearing Restrictions Yes  Other Position/Activity Restrictions sternal precautions  Pain Assessment  Pain Assessment No/denies pain  Faces Pain Scale 4  Pain Location general discomfort, will have an occasional sharp pain at 8/10 on L side  Pain Intervention(s) Limited activity within patient's tolerance;Monitored during session;Repositioned  Cognition  Arousal/Alertness Awake/alert  Behavior During Therapy [facility] for tasks assessed/performed  Overall Cognitive Status Within Functional Limits for tasks assessed  Bed Mobility  General bed mobility comments OOB in recliner on entry   Transfers  Overall transfer level Needs assistance  Equipment used Rolling [place] (2 wheeled)  Transfers Sit to/from Stand  Sit to Stand Min guard  General transfer comment min guard for safety, vc for hands on knees and rocking forward to come into standing    Ambulation/Gait  Ambulation/Gait assistance Min guard  Gait Distance (Feet) 20 Feet  Assistive device Rolling Floren (2 wheeled)  Gait Pattern/deviations Step-through pattern;Decreased stride length;Trunk flexed  General Gait Details min guard for safety, vc for proximity to RW  Gait velocity slow  Gait velocity interpretation <1.31 ft/sec, indicative of household ambulator  Stairs Yes  Stairs assistance Min assist  Stair Management One rail Right  Number of Stairs 1 (x7)  General stair comments min A for steadying with ambulation up 1 step x 7 with rail on R   Balance  Overall balance assessment Needs assistance  Sitting-balance support Feet supported;No upper extremity supported  Sitting balance-Leahy Scale Fair  Standing balance support No upper extremity supported  Standing balance-Leahy Scale Fair  General Comments  General comments (skin integrity, edema, etc.) son [name] in room, very supportive. Pt ambulated on 2 L O2 via [place] with SaO2 >90%O2  PT - End of Session  Equipment Utilized During Treatment Oxygen (2LO2 via [place])  Activity Tolerance Patient limited by fatigue  Patient left in chair;with call bell/phone within reach;with family/visitor present  Nurse Communication Mobility status   PT - Assessment/Plan  PT Plan Current plan remains appropriate  PT Visit Diagnosis Unsteadiness on feet (R26.81);Muscle weakness (generalized) (M62.81);Difficulty in walking, not elsewhere classified (R26.2)  PT Frequency (ACUTE ONLY) Min 3X/week  Follow Up Recommendations Home health PT;Supervision/Assistance - 24 hour  PT equipment Rolling [place] with 5" wheels  AM-PAC PT "6 Clicks" Mobility Outcome Measure (Version 2)  Help needed turning from your back to your side while in a flat bed without using bedrails? 3  Help needed moving from lying on your back to sitting on the side of a flat bed without using bedrails? 3  Help needed moving to and from a bed to a chair (including a  wheelchair)? 3  Help needed standing up from a chair using your arms (e.g., wheelchair or bedside chair)? 3  Help needed to walk in hospital room? 3  Help needed climbing 3-5 steps with a railing?  2  6 Click Score 17  Consider Recommendation of Discharge To: Home with [facility]  PT Goal Progression  Progress towards PT goals Progressing toward goals  Acute Rehab PT Goals  PT Goal Formulation With patient/family  Time For Goal Achievement 06/18/20  Potential to Achieve Goals Good  PT Time Calculation  PT Start Time (ACUTE ONLY) 1030  PT Stop Time (ACUTE ONLY) 1051  PT Time Calculation (min) (ACUTE ONLY) 21 min  PT General Charges  $$ ACUTE PT VISIT 1 Visit  PT Treatments  $Gait Training 8-22 mins   Jodie Cavey B. Migdalia Dk PT, DPT Acute Rehabilitation Services Pager [phone] Office [phone]

## 2020-06-05 NOTE — Progress Notes (Addendum)
[address]       [employer] [address]             [phone]      7 Days Post-Op Procedure(s) (LRB): REPAIR OF ACUTE ASCENDING THORACIC AORTIC DISSECTION USING HEMASHIELD PLATINUM WOVEN DOUBLE VELOUR VASCULAR GRAFT 32 MM X 30 CM (N/A) TRANSESOPHAGEAL ECHOCARDIOGRAM (TEE) (N/A) Subjective: conts to feel better  Objective: Vital signs in last 24 hours: Temp:  [97.8 F (36.6 C)-98.8 F (37.1 C)] 98.2 F (36.8 C) (11/15 0509) Pulse Rate:  [71-83] 73 (11/15 0509) Cardiac Rhythm: Normal sinus rhythm (11/14 1900) Resp:  [17-22] 19 (11/15 0509) BP: (104-140)/(68-85) 119/68 (11/15 0509) SpO2:  [92 %-99 %] 96 % (11/15 0509) Weight:  [85.2 kg] 85.2 kg (11/15 0500)  Hemodynamic parameters for last 24 hours:    Intake/Output from previous day: 11/14 0701 - 11/15 0700 In: -  Out: 3818 [Urine:1275] Intake/Output this shift: No intake/output data recorded.  General appearance: alert, cooperative and no distress Heart: regular rate and rhythm Lungs: dim in bases Abdomen: benign Extremities: no edema Wound: incis healing well  Lab Results: Recent Labs    06/04/20 0157 06/05/20 0238  WBC 9.1 11.5*  HGB 7.9* 8.1*  HCT 24.3* 24.6*  PLT 243 297   BMET:  Recent Labs    06/03/20 1019 06/04/20 0157  NA 133* 133*  K 3.6 4.1  CL 92* 92*  CO2 31 30  GLUCOSE 151* 126*  BUN 19 24*  CREATININE 1.18 1.22  CALCIUM 8.6* 8.4*    PT/INR: No results for input(s): LABPROT, INR in the last 72 hours. ABG    Component Value Date/Time   PHART 7.381 05/30/2020 0901   HCO3 24.0 05/30/2020 0901   TCO2 25 05/30/2020 0901   ACIDBASEDEF 1.0 05/30/2020 0901   O2SAT 93.0 05/30/2020 0901   CBG (last 3)  Recent Labs    06/03/20 0401 06/03/20 0903 06/03/20 1314  GLUCAP 104* 115* 96    Meds Scheduled Meds: . amiodarone  400 mg Oral BID  . aspirin EC  81 mg Oral Daily  . bisacodyl  10 mg Rectal Daily  . Chlorhexidine Gluconate Cloth  6 each Topical Daily    . colchicine  0.3 mg Oral BID  . docusate sodium  200 mg Oral Daily  . furosemide  40 mg Oral Daily  . hydrochlorothiazide  12.5 mg Oral Daily  . mouth rinse  15 mL Mouth Rinse BID  . metoprolol tartrate  37.5 mg Oral BID  . oxcarbazepine  600 mg Oral BID  . pantoprazole  40 mg Oral Daily  . potassium chloride  20 mEq Oral Daily   Continuous Infusions: . lactated ringers     PRN Meds:.lactated ringers, metoprolol tartrate, ondansetron (ZOFRAN) IV, oxyCODONE, sodium chloride flush, traMADol  Xrays DG Chest 1 View  Result Date: 06/03/2020 CLINICAL DATA:  Postop EXAM: CHEST  1 VIEW COMPARISON:  June 03, 2020 FINDINGS: The cardiomediastinal silhouette is unchanged and enlarged in contour.Status post median sternotomy. Small RIGHT pleural effusion, unchanged. Resolution of LEFT-sided pleural effusion status post thoracocentesis. No pneumothorax. Scattered bibasilar atelectasis. Mild diffuse interstitial prominence. Visualized abdomen is unremarkable. Mild degenerative changes of the thoracic spine. IMPRESSION: 1. Small RIGHT pleural effusion, unchanged. 2. Resolution of LEFT-sided pleural effusion status post thoracocentesis. No pneumothorax. Electronically Signed   By: Valentino Saxon MD   On: 06/03/2020 14:24   US THORACENTESIS ASP PLEURAL SPACE W/IMG GUIDE  Result Date: 06/03/2020 INDICATION: Status  post repair of acute ascending thoracic aortic dissection. Right pleural effusion. Request for therapeutic thoracentesis. EXAM: ULTRASOUND GUIDED LEFT THORACENTESIS MEDICATIONS: 1% lidocaine 10 mL COMPLICATIONS: None immediate. PROCEDURE: An ultrasound guided thoracentesis was thoroughly discussed with the patient and questions answered. The benefits, risks, alternatives and complications were also discussed. The patient understands and wishes to proceed with the procedure. Written consent was obtained. Ultrasound was performed to localize and mark an adequate pocket of fluid in the left  chest. The area was then prepped and draped in the normal sterile fashion. 1% Lidocaine was used for local anesthesia. Under ultrasound guidance a 6 Fr Safe-T-Centesis catheter was introduced. Thoracentesis was performed. The catheter was removed and a dressing applied. FINDINGS: A total of approximately 1 L of blood was removed. IMPRESSION: Successful ultrasound guided left thoracentesis yielding 1 L of blood. Read by: Gareth Eagle, PA-C Electronically Signed   By: Aletta Edouard M.D.   On: 06/03/2020 13:18    Assessment/Plan: S/P Procedure(s) (LRB): REPAIR OF ACUTE ASCENDING THORACIC AORTIC DISSECTION USING HEMASHIELD PLATINUM WOVEN DOUBLE VELOUR VASCULAR GRAFT 32 MM X 30 CM (N/A) TRANSESOPHAGEAL ECHOCARDIOGRAM (TEE) (N/A)    1 afeb, VSS. Remains in sinus rhythm 2 sats good on 2 liters- wean off 3 CXR looks like a bit more infiltrate on left base, will see what radiology read shows  4 H/H stable 5 slight increase in leukocytosis to WBC 11.5 6 walked much better yesterday 7 d/c epw's  8 poss home in 1-2 days  LOS: 7 days    John Giovanni PA-C Pager [phone] 06/05/2020  Chest x-ray shows atelectasis at left base Remains afebrile and blood pressure well controlled Making progress with physical therapy but still with significant fatigue and not ready for discharge to home near future.  Dahlia Byes MD

## 2020-06-05 NOTE — Care Management Important Message (Signed)
Important Message  Patient Details  Name: Juan Wagner MRN: [id number] Date of Birth: 01/19/1951   Medicare Important Message Given:  Yes     Shelda Altes 06/05/2020, 11:04 AM

## 2020-06-05 NOTE — Progress Notes (Signed)
Mobility Specialist: Progress Note   06/05/20 1731  Mobility  Activity Ambulated in hall (Simultaneous filing. User may not have seen previous data.)  Level of Assistance Contact guard assist, steadying assist (Simultaneous filing. User may not have seen previous data.)  Assistive [employer] wheel [place] (Simultaneous filing. User may not have seen previous data.)  Distance Ambulated (ft) 470 ft (Simultaneous filing. User may not have seen previous data.)  Mobility Response Tolerated well  Mobility performed by Family member (Simultaneous filing. User may not have seen previous data.)  Bed Position Chair   Pt ambulated in hallway with family member. Pt had no c/o during ambulation.   [facility] Avriana Joo Mobility Specialist

## 2020-06-05 NOTE — Evaluation (Signed)
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 Had a thoracentesis performed as well.   PMH: seizures, HTN, sleep apnea, PSH: L knee scope   Clinical Impression   PTA, pt was living with his wife and was independent. Pt currently requiring Min-Mod A for UB ADLs, Min A for LB ADLs, and Min Guard A for functional mobility with RW. Providing education on sternal precautions and compensatory techniques for ADLs. Pt able to perform figure four method for LB dressing. Pt requiring Min A for BLEs during log roll in returning to bed. Pt would benefit from further acute OT to facilitate safe dc. Recommend dc to home with HHOT for further OT to optimize safety, independence with ADLs, and return to PLOF.   SpO2 100% on RA. HR 90s.     Follow Up Recommendations  Home health OT;Supervision/Assistance - 24 hour (May reach no HHOT needs. )    Equipment Recommendations  None recommended by OT    Recommendations for Other Services PT consult     Precautions / Restrictions Precautions Precautions: Fall Precaution Comments: sternal Restrictions Weight Bearing Restrictions: Yes Other Position/Activity Restrictions: sternal precautions      Mobility Bed Mobility Overal bed mobility: Needs Assistance Bed Mobility: Rolling;Sit to Sidelying Rolling: Min guard Sidelying to sit: Min assist     Sit to sidelying: Min assist General bed mobility comments: Min A to bring BLEs over EOB    Transfers Overall transfer level: Needs assistance Equipment used: Rolling Agustin (2 wheeled) Transfers: Sit to/from Stand Sit to Stand: Min guard         General transfer comment: Min Guard A for safety. Education on hand placement    Balance Overall balance assessment: Needs  assistance Sitting-balance support: Feet supported;No upper extremity supported Sitting balance-Leahy Scale: Fair     Standing balance support: No upper extremity supported Standing balance-Leahy Scale: Fair                             ADL either performed or assessed with clinical judgement   ADL Overall ADL's : Needs assistance/impaired Eating/Feeding: Set up;Sitting   Grooming: Wash/dry hands;Standing;Min guard   Upper Body Bathing: Moderate assistance;Sitting   Lower Body Bathing: Minimal assistance;Sit to/from stand   Upper Body Dressing : Minimal assistance;Sitting Upper Body Dressing Details (indicate cue type and reason): Initated education on compensatory techniques for dressing Lower Body Dressing: Minimal assistance;Sit to/from stand Lower Body Dressing Details (indicate cue type and reason): Pt able to perform figure four method Toilet Transfer: Min guard;Ambulation;RW;Regular Museum/gallery exhibitions officer and Hygiene: Min guard;Sit to/from stand Toileting - Clothing Manipulation Details (indicate cue type and reason): Educating pt on bending at knees to perform peri care     Functional mobility during ADLs: Min guard;Rolling [place] General ADL Comments: Pt presenting with decreased ROM and activity tolerance.      Vision         Perception     Praxis      Pertinent Vitals/Pain Pain Assessment: Faces Faces Pain Scale: Hurts little more Pain Location: general discomfort, will have an occasional sharp pain at 8/10 on L side Pain Intervention(s): Monitored during session;Repositioned     Hand Dominance Right   Extremity/Trunk Assessment Upper  Extremity Assessment Upper Extremity Assessment: Overall WFL for tasks assessed   Lower Extremity Assessment Lower Extremity Assessment: Defer to PT evaluation   Cervical / Trunk Assessment Cervical / Trunk Assessment: Other exceptions Cervical / Trunk Exceptions: sternal incision    Communication Communication Communication: No difficulties   Cognition Arousal/Alertness: Awake/alert Behavior During Therapy: WFL for tasks assessed/performed Overall Cognitive Status: Within Functional Limits for tasks assessed                                     General Comments  SpO2 100% on RA. HR 90s.     Exercises    Shoulder Instructions      Home Living Family/patient expects to be discharged to:: Private residence Living Arrangements: Spouse/significant other Available Help at Discharge: Family;Available 24 hours/day Type of Home: House Home Access: Stairs to enter [employer] of Steps: 2 Entrance Stairs-Rails: None Home Layout: Two level;Able to live on main level with bedroom/bathroom Alternate Level Stairs-Number of Steps: 7 (x2 to get to shower) Alternate Level Stairs-Rails: Left Bathroom Shower/Tub: Hospital doctor Toilet: Handicapped height     Home Equipment: Shower seat - built in;[place] - 2 wheels;Cane - single point;Bedside commode          Prior Functioning/Environment Level of Independence: Independent        Comments: retired but volunteers with Water quality [employer]        OT Problem List: Decreased strength;Decreased range of motion;Decreased activity tolerance;Impaired balance (sitting and/or standing);Decreased knowledge of use of DME or AE;Decreased knowledge of precautions;Cardiopulmonary status limiting activity      OT Treatment/Interventions: Self-care/ADL training;Therapeutic exercise;Energy conservation;DME and/or AE instruction;Therapeutic activities;Patient/family education    OT Goals(Current goals can be found in the care plan section) Acute Rehab OT Goals Patient Stated Goal: didn't state OT Goal Formulation: With patient Time For Goal Achievement: 06/19/20 Potential to Achieve Goals: Good  OT Frequency: Min 3X/week   Barriers to D/C:            Co-evaluation               AM-PAC OT "6 Clicks" Daily Activity     Outcome Measure Help from another person eating meals?: A Little Help from another person taking care of personal grooming?: A Little Help from another person toileting, which includes using toliet, bedpan, or urinal?: A Little Help from another person bathing (including washing, rinsing, drying)?: A Lot Help from another person to put on and taking off regular upper body clothing?: A Little Help from another person to put on and taking off regular lower body clothing?: A Little 6 Click Score: 17   End of Session Equipment Utilized During Treatment: Rolling [place] Nurse Communication: Mobility status  Activity Tolerance: Patient tolerated treatment well Patient left: in bed;with call bell/phone within reach;with family/visitor present  OT Visit Diagnosis: Unsteadiness on feet (R26.81);Other abnormalities of gait and mobility (R26.89);Muscle weakness (generalized) (M62.81)                Time: 7564-3329 OT Time Calculation (min): 19 min Charges:  OT General Charges $OT Visit: 1 Visit OT Evaluation $OT Eval Moderate Complexity: [place], OTR/L Acute Rehab Pager: [phone] Office: [place] 06/05/2020, 1:19 PM

## 2020-06-05 NOTE — Progress Notes (Signed)
CARDIAC REHAB PHASE I   PRE:  Rate/Rhythm: 77 SR    BP: sitting 145/90    SaO2: 97 1 1/2L  MODE:  Ambulation: 470 ft   POST:  Rate/Rhythm: 102 ST    BP: sitting 127/80     SaO2: 91 2L  Pt without c/o. Able to move to EOB and stand independently with verbal cues for sternal precautions. Attempted RA however SAO2 quickly dropped to 81 RA.  Slow pace in hall with RW, 2L, gait belt, no major c/o.  To recliner after walk, BP lower but denied sx while walking. SaO2 maintaining on 2L. Pt performing 500 mL on IS, encouraged increased use today.  [place], ACSM 06/05/2020 8:56 AM

## 2020-06-05 NOTE — Progress Notes (Signed)
Epicardial wires pulled. Tips intact. Sights clean and dry. Vital signs stable. Patient educated on 1 hour bedrest. Patient tolerated well.  Daymon Larsen, RN

## 2020-06-06 DIAGNOSIS — I4891 Unspecified atrial fibrillation: Secondary | ICD-10-CM

## 2020-06-06 LAB — CBC
HCT: 26.6 % — ABNORMAL LOW (ref 39.0–52.0)
Hemoglobin: 8.6 g/dL — ABNORMAL LOW (ref 13.0–17.0)
MCH: 28.8 pg (ref 26.0–34.0)
MCHC: 32.3 g/dL (ref 30.0–36.0)
MCV: 89 fL (ref 80.0–100.0)
Platelets: 402 10*3/uL — ABNORMAL HIGH (ref 150–400)
RBC: 2.99 MIL/uL — ABNORMAL LOW (ref 4.22–5.81)
RDW: 14.3 % (ref 11.5–15.5)
WBC: 14.2 10*3/uL — ABNORMAL HIGH (ref 4.0–10.5)
nRBC: 0 % (ref 0.0–0.2)

## 2020-06-06 MED ORDER — AMIODARONE HCL 200 MG PO TABS: 200.0000 mg | ORAL_TABLET | Freq: Two times a day (BID) | ORAL | 1 refills | Status: DC

## 2020-06-06 MED ORDER — TRAMADOL HCL 50 MG PO TABS: 50.0000 mg | ORAL_TABLET | Freq: Four times a day (QID) | ORAL | 0 refills | Status: AC | PRN

## 2020-06-06 MED ORDER — COLCHICINE 0.6 MG PO TABS: 0.3000 mg | ORAL_TABLET | Freq: Two times a day (BID) | ORAL | 0 refills | Status: DC

## 2020-06-06 MED ORDER — METOPROLOL TARTRATE 37.5 MG PO TABS: 37.5000 mg | ORAL_TABLET | Freq: Two times a day (BID) | ORAL | 1 refills | Status: DC

## 2020-06-06 NOTE — Progress Notes (Signed)
      [address]       [place] [address]             [phone]     Sats acceptable with ambulation.  He has a Prust for home use. Home OT/PT to be arranged. Stable for discharge  John Giovanni, PA-C

## 2020-06-06 NOTE — Progress Notes (Addendum)
[address]       [place] [address]             [phone]      8 Days Post-Op Procedure(s) (LRB): REPAIR OF ACUTE ASCENDING THORACIC AORTIC DISSECTION USING HEMASHIELD PLATINUM WOVEN DOUBLE VELOUR VASCULAR GRAFT 32 MM X 30 CM (N/A) TRANSESOPHAGEAL ECHOCARDIOGRAM (TEE) (N/A) Subjective: conts to feel better/stronger  Objective: Vital signs in last 24 hours: Temp:  [98.5 F (36.9 C)-98.8 F (37.1 C)] 98.5 F (36.9 C) (11/15 2335) Pulse Rate:  [73-93] 87 (11/15 2335) Cardiac Rhythm: Normal sinus rhythm (11/15 1900) Resp:  [11-22] 20 (11/15 2335) BP: (122-144)/(74-90) 144/88 (11/15 2039) SpO2:  [92 %-100 %] 92 % (11/15 2335)  Hemodynamic parameters for last 24 hours:    Intake/Output from previous day: 11/15 0701 - 11/16 0700 In: -  Out: 1075 [Urine:1075] Intake/Output this shift: Total I/O In: -  Out: 380 [Urine:380]  General appearance: alert, cooperative and no distress Heart: regular rate and rhythm Lungs: dim in bases Abdomen: benign Extremities: no edema Wound: incis healing well  Lab Results: Recent Labs    06/05/20 0238 06/06/20 0203  WBC 11.5* 14.2*  HGB 8.1* 8.6*  HCT 24.6* 26.6*  PLT 297 402*   BMET:  Recent Labs    06/03/20 1019 06/04/20 0157  NA 133* 133*  K 3.6 4.1  CL 92* 92*  CO2 31 30  GLUCOSE 151* 126*  BUN 19 24*  CREATININE 1.18 1.22  CALCIUM 8.6* 8.4*    PT/INR: No results for input(s): LABPROT, INR in the last 72 hours. ABG    Component Value Date/Time   PHART 7.381 05/30/2020 0901   HCO3 24.0 05/30/2020 0901   TCO2 25 05/30/2020 0901   ACIDBASEDEF 1.0 05/30/2020 0901   O2SAT 93.0 05/30/2020 0901   CBG (last 3)  Recent Labs    06/03/20 0903 06/03/20 1314  GLUCAP 115* 96    Meds Scheduled Meds: . amiodarone  400 mg Oral BID  . aspirin EC  81 mg Oral Daily  . bisacodyl  10 mg Rectal Daily  . Chlorhexidine Gluconate Cloth  6 each Topical Daily  . colchicine  0.3 mg Oral BID  .  docusate sodium  200 mg Oral Daily  . furosemide  40 mg Oral Daily  . hydrochlorothiazide  12.5 mg Oral Daily  . mouth rinse  15 mL Mouth Rinse BID  . metoprolol tartrate  37.5 mg Oral BID  . oxcarbazepine  600 mg Oral BID  . pantoprazole  40 mg Oral Daily  . potassium chloride  20 mEq Oral Daily   Continuous Infusions: . lactated ringers     PRN Meds:.lactated ringers, metoprolol tartrate, ondansetron (ZOFRAN) IV, oxyCODONE, sodium chloride flush, traMADol  Xrays DG Chest 2 View  Result Date: 06/05/2020 CLINICAL DATA:  Syncope and weakness EXAM: CHEST - 2 VIEW COMPARISON:  Two days ago FINDINGS: Cardiomegaly and aortic tortuosity. Median sternotomy unremarkable hardware. Indistinct opacity at the bases with small pleural effusions. No pneumothorax or Kerley lines. Postoperative right axilla IMPRESSION: Small pleural effusions and presumed atelectasis. Stable cardiomediastinal contours. Electronically Signed   By: Monte Fantasia M.D.   On: 06/05/2020 07:50    Assessment/Plan: S/P Procedure(s) (LRB): REPAIR OF ACUTE ASCENDING THORACIC AORTIC DISSECTION USING HEMASHIELD PLATINUM WOVEN DOUBLE VELOUR VASCULAR GRAFT 32 MM X 30 CM (N/A) TRANSESOPHAGEAL ECHOCARDIOGRAM (TEE) (N/A)  1 afeb, VSS- 1 episode of SBP > 140, sinus rhythm, sinus tachy 2 sats ok  on RA but when walking yesterday dropped to low 80's- will have to see how he does today. IS - fair performance 500 ml, endurance /strength improving 3 leukocytosis trend increased - WBC now 14.2 4 ATX vs infilt on CXR- will get f/u in am 5 H/H improving trend 6 prob home in am if no new issues  LOS: 8 days    John Giovanni PA-C Pager [phone] 06/06/2020    I have seen and examined the patient and agree with the assessment and plan as outlined.  Looks good.  NSR w/ stable BP.  Breathing comfortably on RA this morning.  Episode desaturation yesterday w/ ambulation.  CXR looks okay.  WBC up slightly but no fever, no cough and wound  looks good.  No signs of fluid overload.  Possible d/c home later today or tomorrow as long as O2 sats okay during ambulation on room air.  Plan early f/u visit in the office w/ repeat CXR  Rexene Alberts, MD 06/06/2020 8:52 AM

## 2020-06-06 NOTE — Progress Notes (Signed)
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 Had a thoracentesis performed as well.   PMH: seizures, HTN, sleep apnea, PSH: L knee scope    PT Comments    Pt sitting up in chair on entry, reports feeling better today. Pt able to demonstrate good adherence to sternal precautions through out session. Pt is min guard for transfers and ambulation with RW. Pt is light minA for steadying with ascent/descent of 10 steps with rail on R. Son present for stair training able to provide assist. Pt with 4/4 DoE after stair training however able to recover quickly with self driven purse lip breathing. Pt and son discussed need for HHPT given pt is to start Cardiac Rehab next week. PT in agreement that pt will not need additional PT services at discharge. Pt hopeful for d/c home this afternoon.    Follow Up Recommendations  No PT follow up;Supervision/Assistance - 24 hour     Equipment Recommendations  Rolling Vidas with 5" wheels    Recommendations for Other Services       Precautions / Restrictions Precautions Precautions: Fall Precaution Comments: sternal Restrictions Other Position/Activity Restrictions: sternal precautions    Mobility  Bed Mobility               General bed mobility comments: OOB in recliner on entry   Transfers Overall transfer level: Needs assistance Equipment used: Rolling [place] (2 wheeled) Transfers: Sit to/from Stand Sit to Stand: Min guard         General transfer comment: min guard for safety, good adherence to sternal precautions  Ambulation/Gait Ambulation/Gait assistance: Min guard Gait Distance (Feet): 50 Feet Assistive device: Rolling Spieler (2 wheeled) Gait Pattern/deviations: Step-through pattern;Decreased stride  length;Trunk flexed Gait velocity: slow Gait velocity interpretation: <1.31 ft/sec, indicative of household ambulator General Gait Details: min guard for safety   Stairs Stairs: Yes Stairs assistance: Min assist Stair Management: One rail Right;Alternating pattern;Forwards Number of Stairs: 10 General stair comments: min A for steadying with ascent/descent of 10 steps, requires standing rest break at top of stairs, son present for training, discussed how he could not use both rails to descend as it was "outside the tube" pt with good purse lip breath control to recover from 4/4 DoE at bottom of steps       Balance Overall balance assessment: Needs assistance Sitting-balance support: Feet supported;No upper extremity supported Sitting balance-Leahy Scale: Fair     Standing balance support: No upper extremity supported Standing balance-Leahy Scale: Fair                              Cognition Arousal/Alertness: Awake/alert Behavior During Therapy: WFL for tasks assessed/performed Overall Cognitive Status: Within Functional Limits for tasks assessed                                           General Comments General comments (skin integrity, edema, etc.): max HR noted with stairs 96bpm, could not get good O2 reading after stair training, recovered to 90s when monitored back in room      Pertinent Vitals/Pain Pain Assessment: Faces Faces Pain Scale: Hurts a  little bit Pain Location: general discomfort, will have an occasional sharp pain at 8/10 on L side Pain Intervention(s): Monitored during session;Repositioned           PT Goals (current goals can now be found in the care plan section) Acute Rehab PT Goals Patient Stated Goal: didn't state PT Goal Formulation: With patient/family Time For Goal Achievement: 06/18/20 Potential to Achieve Goals: Good Progress towards PT goals: Progressing toward goals    Frequency    Min 3X/week       PT Plan Current plan remains appropriate       AM-PAC PT "6 Clicks" Mobility   Outcome Measure  Help needed turning from your back to your side while in a flat bed without using bedrails?: A Little Help needed moving from lying on your back to sitting on the side of a flat bed without using bedrails?: A Little Help needed moving to and from a bed to a chair (including a wheelchair)?: A Little Help needed standing up from a chair using your arms (e.g., wheelchair or bedside chair)?: A Little Help needed to walk in hospital room?: A Little Help needed climbing 3-5 steps with a railing? : A Lot 6 Click Score: 17    End of Session   Activity Tolerance: Patient tolerated treatment well Patient left: in chair;with call bell/phone within reach;with family/visitor present Nurse Communication: Mobility status PT Visit Diagnosis: Unsteadiness on feet (R26.81);Muscle weakness (generalized) (M62.81);Difficulty in walking, not elsewhere classified (R26.2)     Time: 1205-1227 PT Time Calculation (min) (ACUTE ONLY): 22 min  Charges:  $Gait Training: 8-22 mins                     Willadean Guyton B. Migdalia Dk PT, DPT Acute Rehabilitation Services Pager [phone] Office [phone]    [place] 06/06/2020, 1:57 PM

## 2020-06-06 NOTE — TOC Transition Note (Signed)
Transition of Care (TOC) - CM/SW Discharge Note Marvetta Gibbons RN, BSN Transitions of Care Unit 4E- RN Case Manager See Treatment Team for direct phone #    Patient Details  Name: ZYRON DEELEY MRN: [id number] Date of Birth: Aug 13, 1950  Transition of Care [facility]) CM/SW Contact:  Dawayne Patricia, RN Phone Number: 06/06/2020, 2:44 PM   Clinical Narrative:    Pt stable for transition home today, orders placed for HHPT/OT, CM in to speak with pt and son at bedside, per conversation son states they have a RW, BSC and shower chair at home already- no other DME needs noted. Per pt and son they feel that pt has progressed to point were [facility] will not be needed- politely decline [facility] services at this time.  Son to transport home.  Meds have been sent to outside pharmacy as per pt' request  Final next level of care: Home/Self Care Barriers to Discharge: No Barriers Identified   Patient Goals and CMS Choice Patient states their goals for this hospitalization and ongoing recovery are:: return home CMS Medicare.gov Compare Post Acute Care list provided to:: Patient Choice offered to / list presented to : Patient, Adult Children  Discharge Placement               Home         Discharge Plan and Services   Discharge Planning Services: CM Consult Post Acute Care Choice: Home Health          DME Arranged: N/A DME Agency: NA       HH Arranged: PT, OT, Patient Refused [place] Agency: NA        Social Determinants of Health (SDOH) Interventions     Readmission Risk Interventions Readmission Risk Prevention Plan 06/06/2020  Post Dischage Appt Complete  Medication Screening Complete  Transportation Screening Complete  Some recent data might be hidden

## 2020-06-06 NOTE — Discharge Instructions (Signed)
Discharge Instructions:  1. You may shower, please wash incisions daily with soap and water and keep dry.  If you wish to cover wounds with dressing you may do so but please keep clean and change daily.  No tub baths or swimming until incisions have completely healed.  If your incisions become red or develop any drainage please call our office at [phone]  2. No Driving until cleared by surgeon office and you are no longer using narcotic pain medications  3. Monitor your weight daily.. Please use the same scale and weigh at same time... If you gain 3-5 lbs in 48 hours with associated lower extremity swelling, please contact our office at [phone]  4. Fever of 101.5 for at least 24 hours with no source, please contact our office at [phone]  5. Activity- up as tolerated, please walk at least 3 times per day.  Avoid strenuous activity, no lifting, pushing, or pulling with your arms over 8-10 lbs for a minimum of 6 weeks  6. If any questions or concerns arise, please do not hesitate to contact our office at [phone]    Aortic Dissection  Aortic dissection happens when there is a tear in the wall of the body's main blood vessel (aorta). The aorta leads out of the heart (ascending aorta), curves around, and then goes down the chest (descending aorta) and into the abdomen to supply arteries with blood. The wall of the aorta has inner and outer layers. As blood collects along the tear, one part of the aorta continues to carry blood to the body, but blood can also flow into the tear between the layers of the aorta. The torn part of the aorta fills with blood and swells. This can reduce blood flow through the part of the aorta that is still supplying blood to the body. Aortic dissection is a medical emergency. What are the causes? This condition is commonly caused by weakening of the artery wall due to high blood pressure. Other causes may include:  An injury, such as from a car  crash.  A complication from heart surgery or from a diagnostic procedure called coronary catheterization.  Weakness of the artery wall due to birth defects that affect the connective tissues, such as Marfan syndrome. In some cases, the cause is not known. What increases the risk? The following factors may make you more likely to develop this condition:  Having certain medical conditions, such as: ? High blood pressure (hypertension). ? Hardening and narrowing of the arteries (atherosclerosis). ? A condition that causes inflammation of blood vessels, such as giant cell arteritis.  Having two cusps in the aortic valve instead of three (bicuspid aortic valve).  Having a bulge in the wall of the aorta (aortic aneurysm).  Being male.  Being pregnant.  Being older than age 38.  Using cocaine.  Smoking.  Lifting heavy weights or doing other types of strength training (high-intensity resistance training). What are the signs or symptoms? Signs and symptoms of aortic dissection start suddenly. The most common symptoms are:  Severe chest pain that may feel like tearing, stabbing, or sharp pain.  Severe pain that spreads (radiates) to the back, neck, jaw, or abdomen.  Severe pain between the shoulder blades in the back. Other symptoms may include:  Trouble breathing.  Dizziness or fainting.  Sudden weakness on one side of the body.  Nausea or vomiting.  Trouble swallowing.  Coughing up blood.  Vomiting blood.  Clammy skin. How is this diagnosed? This condition  may be diagnosed based on:  Your symptoms and a physical exam. This may include: ? Listening for abnormal blood flow sounds (murmurs) in your chest or abdomen. ? Checking your pulse in your arms and legs. ? Checking your blood pressure to see whether it is low, or whether there is a difference between the measurements (readings) from your right arm and left arm.  Electrocardiogram (ECG). This test measures the  electrical activity in your heart.  Chest X-ray.  CT scan.  MRI.  Echocardiogram. This uses sound waves to make images of your heart.  Blood tests. How is this treated? It is important to treat aortic dissection as quickly as possible. Treatment may start as soon as your health care provider thinks that you have aortic dissection. Treatment depends on where the dissection is, how severe it is, and your overall health. Treatment may include:  Medicines to lower your heart rate and blood pressure.  Surgery to repair your aorta using artificial material (syntheticgraft).  A procedure to insert a stent-graft into the aorta (endovascular procedure). During this procedure: 1. A long, thin tube (stent) is inserted into an artery near the groin (femoral artery). 2. The stent is moved up to the damaged part of the aorta. 3. The stent is opened to help improve blood flow and prevent future dissection. Your health care provider may refer you to a specialized treatment center. Follow these instructions at home: If you had surgery, follow instructions from your health care provider about home care after the procedure. Activity  Do not lift anything that is heavier than 10 lb (4.5 kg), or the limit that you are told, until your health care provider says that it is safe.  Avoid activities that could injure your chest or abdomen. Ask your health care provider what activities are safe for you.  After you have recovered, try to stay active. Ask your health care provider what activities are safe for you after recovery.  Enroll in cardiac rehabilitation. This is a program that helps to improve your health and well-being. It includes exercise training, education, and counseling to help you recover. Lifestyle      Eat a heart-healthy diet, which includes lots of fresh fruits and vegetables, low-fat (lean) protein, and whole grains.  Work with your health care provider to treat any other  conditions that you may have, such as obesity, high blood pressure, or diabetes.  Do not use any products that contain nicotine or tobacco, such as cigarettes, e-cigarettes, and chewing tobacco. If you need help quitting, ask your health care provider. General instructions  Take over-the-counter and prescription medicines only as told by your health care provider.  Talk with your health care provider about how to manage stress.  Keep all follow-up visits as told by your health care provider. This is important. Get help right away if you:  Develop any symptoms of aortic dissection after treatment, including severe pain in your chest, back, or abdomen.  Have pain in your chest.  Have weakness in your arm or leg.  Have pain in your abdomen.  Have trouble breathing or you develop a cough.  Faint.  Develop a racing heartbeat (palpitations). These symptoms may represent a serious problem that is an emergency. Do not wait to see if the symptoms will go away. Get medical help right away. Call your local emergency services (911 in the [place] Do not drive yourself to the hospital. Summary  Aortic dissection happens when there is a tear in the  wall of the body's main blood vessel (aorta). It is a medical emergency.  The most common symptom is severe pain in the chest or pain that spreads (radiates) to the back, neck, jaw, or abdomen.  It is important to treat aortic dissection as quickly as possible. Treatment usually includes medicines and surgery.  Take over-the-counter and prescription medicines only as told by your health care provider. This information is not intended to replace advice given to you by your health care provider. Make sure you discuss any questions you have with your health care provider. Document Revised: 01/14/2018 Document Reviewed: 12/18/2017 Elsevier Patient Education  [employer].

## 2020-06-06 NOTE — Progress Notes (Signed)
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 Had a thoracentesis performed as well.   PMH: seizures, HTN, sleep apnea, PSH: L knee scope   OT comments  Pt progressing towards established OT goals. Reviewing education on compensatory techniques for ADLs to adhere to sternal precautions. Pt donning shirt and shorts with Supervision;Min A to reposition shirt, but overall, pt demonstrating understanding. Discussing grooming, toileting, and bathing; pt verbalized understanding. Update dc recommendation to home once medically stable per physician. Answered all questions and provided education in preparation for dc later today.    Follow Up Recommendations  Supervision/Assistance - 24 hour    Equipment Recommendations  None recommended by OT    Recommendations for Other Services PT consult    Precautions / Restrictions Precautions Precautions: Fall Precaution Comments: Sternal precautions Restrictions Other Position/Activity Restrictions: sternal precautions       Mobility Bed Mobility Overal bed mobility: Needs Assistance             General bed mobility comments: OOB in recliner on entry   Transfers Overall transfer level: Needs assistance Equipment used: Rolling [place] (2 wheeled) Transfers: Sit to/from Stand Sit to Stand: Supervision         General transfer comment: Supervision for safety    Balance Overall balance assessment: Needs assistance Sitting-balance support: Feet supported;No upper extremity supported Sitting balance-Leahy Scale: Fair     Standing balance support: No upper extremity supported Standing balance-Leahy Scale: Fair                             ADL either performed or assessed with clinical judgement    ADL Overall ADL's : Needs assistance/impaired       Grooming Details (indicate cue type and reason): educating pt on use of second cup to spit duirng oral care         Upper Body Dressing : Minimal assistance;Sitting Upper Body Dressing Details (indicate cue type and reason): Providing education on donning/doffing shirt and adhering to sternal precautions. Min A to adjust shirt after pt donned as shirt slightly tight. Feel pt would be able to perform independently with larger shirt.  Lower Body Dressing: Supervision/safety Lower Body Dressing Details (indicate cue type and reason): Supervision for safety. Pt donning pants and demonstrating understanding of compensatory techniques Toilet Transfer: Supervision/safety;Regular Toilet;RW   Toileting- Clothing Manipulation and Hygiene: Supervision/safety;Sitting/lateral lean       Functional mobility during ADLs: Supervision/safety;Rolling [place] General ADL Comments: In bathroom upon arrival. Reviewing compensatory techniques for ADLs to adhere to precautions. Pt donning shirt and pants.     Vision       Perception     Praxis      Cognition Arousal/Alertness: Awake/alert Behavior During Therapy: WFL for tasks assessed/performed Overall Cognitive Status: Within Functional Limits for tasks assessed                                          Exercises     Shoulder Instructions       General Comments VSS on RA; Son precautions throughout session    Pertinent Vitals/ Pain       Pain Assessment: Faces Faces  Pain Scale: Hurts a little bit Pain Location: general discomfort, will have an occasional sharp pain at 8/10 on L side Pain Intervention(s): Monitored during session;Repositioned  Home Living                                          Prior Functioning/Environment              Frequency  Min 3X/week        Progress Toward Goals  OT Goals(current goals can now be found in  the care plan section)  Progress towards OT goals: Progressing toward goals  Acute Rehab OT Goals Patient Stated Goal: didn't state OT Goal Formulation: All assessment and education complete, DC therapy ADL Goals Pt Will Perform Grooming: with modified independence;standing Pt Will Perform Upper Body Dressing: with modified independence;sitting Pt Will Perform Lower Body Dressing: with modified independence;sit to/from stand Pt Will Transfer to Toilet: with modified independence;ambulating;bedside commode Pt Will Perform Toileting - Clothing Manipulation and hygiene: sit to/from stand;sitting/lateral leans;with modified independence Additional ADL Goal #1: Pt will perform bed mobility using log roll techniques with Mod I and increased time as needed  Plan All goals met and education completed, patient discharged from OT services    Co-evaluation                 AM-PAC OT "6 Clicks" Daily Activity     Outcome Measure   Help from another person eating meals?: None Help from another person taking care of personal grooming?: A Little Help from another person toileting, which includes using toliet, bedpan, or urinal?: A Little Help from another person bathing (including washing, rinsing, drying)?: A Little Help from another person to put on and taking off regular upper body clothing?: A Little Help from another person to put on and taking off regular lower body clothing?: A Little 6 Click Score: 19    End of Session Equipment Utilized During Treatment: Rolling [place]  OT Visit Diagnosis: Unsteadiness on feet (R26.81);Other abnormalities of gait and mobility (R26.89);Muscle weakness (generalized) (M62.81)   Activity Tolerance Patient tolerated treatment well   Patient Left in chair;with call bell/phone within reach;with family/visitor present   Nurse Communication Mobility status        Time: 1610-9604 OT Time Calculation (min): 15 min  Charges: OT General Charges $OT  Visit: 1 Visit OT Treatments $Self Care/Home Management : 8-22 mins  [place], OTR/L Acute Rehab Pager: [phone] Office: [place] 06/06/2020, 2:42 PM

## 2020-06-06 NOTE — Progress Notes (Signed)
CARDIAC REHAB PHASE I   PRE:  Rate/Rhythm: 81 SR    BP: sitting 141/86    SaO2: 94 RA  MODE:  Ambulation: 210 ft, 200 ft   POST:  Rate/Rhythm: 93 SR    BP: sitting 128/72     SaO2: 91 RA  Pt doing well today. Able to walk with RW, SOB with distance. Sat and rested then walked again. SaO2 91 RA when it registers. BP is regularly lower after walk.   Discussed IS, sternal precautions, exercise, and diet with pt and son. Very receptive. N/a for CRPII [employer] will not cover). [place], ACSM 06/06/2020 12:11 PM

## 2020-06-07 ENCOUNTER — Telehealth: Payer: Self-pay

## 2020-06-07 NOTE — Telephone Encounter (Signed)
Call patient.  We talked about hydrochlorothiazide.  Inpatient notes advised him to take it.  Assuming he is not having low blood pressures, lightheadedness, or gout flare, I would continue it.  He can check his blood pressure and update me as needed.  We talked about loose stools.  He is not having abdominal pain, bloody stools, or mucus in the stools.  He can monitor this for now.  I am worried that if we try to give him a corrective medication he will end up with constipation.  He agreed.  I question if the decrease in GI transit time is related to taking less pain medication compared to his recent postop time.  He does not have other typical medications that would cause loose stools.  He will update me as needed.  We talked about his follow-up appointment on the 23rd and I look forward to seeing him.  I thanked him for taking the call and I appreciate the help of all involved.

## 2020-06-07 NOTE — Telephone Encounter (Signed)
Transition Care Management Unsuccessful Follow-up Telephone Call  Date of discharge and from where:  [facility]/06/2020[facility]  Attempts:  1st Attempt  Reason for unsuccessful TCM follow-up call:  Left voice message

## 2020-06-07 NOTE — Telephone Encounter (Signed)
Pt wife was returning your call

## 2020-06-07 NOTE — Telephone Encounter (Signed)
Transition Care Management Follow-up Telephone Call  Date of discharge and from where: 06/06/2020, [facility]  How have you been since you were released from the hospital? Patient states that he is doing okay. Still having several loose stools daily. No fever or blood noted.  Any questions or concerns? Yes , Patient wants to know if he needs to continue not taking his HCTZ. He said that Juan Wagner told him to stop taking this recently.   Items Reviewed:  Did the pt receive and understand the discharge instructions provided? Yes   Medications obtained and verified? Yes   Other? No   Any new allergies since your discharge? No   Dietary orders reviewed? Yes  Do you have support at home? Yes   Home Care and Equipment/Supplies: Were home health services ordered? not applicable If so, what is the name of the agency? N/A  Has the agency set up a time to come to the patient's home? not applicable Were any new equipment or medical supplies ordered?  No What is the name of the medical supply agency? N/A Were you able to get the supplies/equipment? not applicable Do you have any questions related to the use of the equipment or supplies? No  Functional Questionnaire: (I = Independent and D = Dependent) ADLs: I  Bathing/Dressing- I  Meal Prep- I  Eating- I  Maintaining continence- I  Transferring/Ambulation- I  Managing Meds- I  Follow up appointments reviewed:   PCP Hospital f/u appt confirmed? Yes  Scheduled to see Juan Wagner on 06/13/2020 @ 2 pm.  [facility] f/u appt confirmed? Yes  Scheduled to see cardiology on 06/19/2020 @ 12:30 pm.  Are transportation arrangements needed? No   If their condition worsens, is the pt aware to call PCP or go to the Emergency Dept.? Yes  Was the patient provided with contact information for the PCP's office or ED? Yes  Was to pt encouraged to call back with questions or concerns? Yes

## 2020-06-12 ENCOUNTER — Other Ambulatory Visit: Payer: Self-pay | Admitting: *Deleted

## 2020-06-12 ENCOUNTER — Other Ambulatory Visit: Payer: Self-pay

## 2020-06-12 ENCOUNTER — Telehealth: Payer: Self-pay | Admitting: *Deleted

## 2020-06-12 ENCOUNTER — Ambulatory Visit (HOSPITAL_COMMUNITY)
Admission: RE | Admit: 2020-06-12 | Discharge: 2020-06-12 | Disposition: A | Discharge status: Home / Self Care | Payer: Medicare Other | Source: Ambulatory Visit | Attending: Cardiology | Admitting: Cardiology

## 2020-06-12 DIAGNOSIS — M7989 Other specified soft tissue disorders: Secondary | ICD-10-CM

## 2020-06-12 DIAGNOSIS — I71019 Dissection of thoracic aorta, unspecified: Secondary | ICD-10-CM

## 2020-06-12 DIAGNOSIS — Z9889 Other specified postprocedural states: Secondary | ICD-10-CM

## 2020-06-12 DIAGNOSIS — I7101 Dissection of thoracic aorta: Secondary | ICD-10-CM

## 2020-06-12 DIAGNOSIS — I82409 Acute embolism and thrombosis of unspecified deep veins of unspecified lower extremity: Secondary | ICD-10-CM

## 2020-06-12 HISTORY — DX: Acute embolism and thrombosis of unspecified deep veins of unspecified lower extremity: I82.409

## 2020-06-12 MED ORDER — APIXABAN 5 MG PO TABS: 5.0000 mg | ORAL_TABLET | Freq: Two times a day (BID) | ORAL | 6 refills | Status: DC

## 2020-06-12 NOTE — Progress Notes (Signed)
Preliminary ultrasound report states pt is positive for DVT. Per Dr. Orvan Seen, pt is to start Eliquis 5mg  BID for 6 months. Order sent to pt's preferred pharmacy. [facility] technician notified of orders.

## 2020-06-12 NOTE — Telephone Encounter (Signed)
Juan Wagner son, [name], called the office over the week & spoke to Dr. Kipp Brood. Per son, Dr. Kipp Brood advised pt to come to the office today for an ultrasound of his right lower extremity r/t below the knee swelling and pain. [facility] contacted and order placed for lower extremity ultrasound to rule out DVT. Pt called to verify appt was made. Informed [name] that once we get results back we will f/u with him and the pt. All questions answered.

## 2020-06-13 ENCOUNTER — Ambulatory Visit: Payer: Medicare Other | Admitting: Family Medicine

## 2020-06-13 ENCOUNTER — Other Ambulatory Visit: Payer: Self-pay | Admitting: Cardiothoracic Surgery

## 2020-06-13 ENCOUNTER — Encounter: Payer: Self-pay | Admitting: Family Medicine

## 2020-06-13 VITALS — BP 120/80 | HR 82 | Temp 98.4°F | Ht 70.0 in | Wt 173.7 lb

## 2020-06-13 DIAGNOSIS — I4891 Unspecified atrial fibrillation: Secondary | ICD-10-CM | POA: Diagnosis not present

## 2020-06-13 DIAGNOSIS — I7101 Dissection of thoracic aorta: Secondary | ICD-10-CM

## 2020-06-13 DIAGNOSIS — I82409 Acute embolism and thrombosis of unspecified deep veins of unspecified lower extremity: Secondary | ICD-10-CM | POA: Diagnosis not present

## 2020-06-13 DIAGNOSIS — I71019 Dissection of thoracic aorta, unspecified: Secondary | ICD-10-CM

## 2020-06-13 NOTE — Progress Notes (Signed)
This visit occurred during the SARS-CoV-2 public health emergency.  Safety protocols were in place, including screening questions prior to the visit, additional usage of staff PPE, and extensive cleaning of exam room while observing appropriate contact time as indicated for disinfecting solutions.  Inpatient follow-up after aortic dissection.  He was exercising, had chest pain, was transported to the emergency room and subsequent work-up demonstrated dissection requiring surgical repair.  He improved enough to be discharged in the meantime but subsequently had right lower leg swelling and pain that led to outpatient ultrasound demonstrating acute DVT and he has subsequently started anticoagulation.  I do not see any recent use of fluoroquinolone antibiotics.  Discussed with patient and preemptively listed that on his allergy list.  He has no family history of dissection.  Not SOB.  No chest pain but he is adjusting to the sternotomy and that is affecting his sleep.  He is still doing incentive spirometry at baseline.    No other h/o DVT prev.  No bleeding.  He was trying to be as active as possible at baseline after his surgery.  He does not have fevers or chills or vomiting.  No rash.  His right lower leg swelling has not yet resolved.  We talked about DVT pathophysiology and also aortic dissection.  Meds, vitals, and allergies reviewed.   ROS: Per HPI unless specifically indicated in ROS section   GEN: nad, alert and oriented HEENT: ncat NECK: supple w/o LA CV: rrr.  PULM: ctab, no inc wob ABD: soft, +bs EXT: no edema on left leg but right calf is still puffy. SKIN: no acute rash Midline sternotomy scar appears to be healing well, surgical port sites healing well.  No spreading erythema.  No discharge.  At least 30 minutes were devoted to patient care in this encounter (this can potentially include time spent reviewing the patient's file/history, interviewing and examining the patient,  counseling/reviewing plan with patient, ordering referrals, ordering tests, reviewing relevant laboratory or x-ray data, and documenting the encounter).

## 2020-06-13 NOTE — Patient Instructions (Signed)
Go to the lab on the way out.   If you have mychart we'll likely use that to update you.    We'll call about seeing cardiology.  Don't change your meds for now.  Take care.  Glad to see you.

## 2020-06-14 ENCOUNTER — Other Ambulatory Visit: Payer: Self-pay | Admitting: Family Medicine

## 2020-06-14 DIAGNOSIS — I7101 Dissection of thoracic aorta: Secondary | ICD-10-CM

## 2020-06-14 DIAGNOSIS — I82409 Acute embolism and thrombosis of unspecified deep veins of unspecified lower extremity: Secondary | ICD-10-CM | POA: Insufficient documentation

## 2020-06-14 DIAGNOSIS — I71019 Dissection of thoracic aorta, unspecified: Secondary | ICD-10-CM

## 2020-06-14 LAB — CBC WITH DIFFERENTIAL/PLATELET
Basophils Absolute: 0.1 10*3/uL (ref 0.0–0.1)
Basophils Relative: 1 % (ref 0.0–3.0)
Eosinophils Absolute: 0.1 10*3/uL (ref 0.0–0.7)
Eosinophils Relative: 0.9 % (ref 0.0–5.0)
HCT: 28.5 % — ABNORMAL LOW (ref 39.0–52.0)
Hemoglobin: 9.4 g/dL — ABNORMAL LOW (ref 13.0–17.0)
Lymphocytes Relative: 6.2 % — ABNORMAL LOW (ref 12.0–46.0)
Lymphs Abs: 0.7 10*3/uL (ref 0.7–4.0)
MCHC: 33 g/dL (ref 30.0–36.0)
MCV: 89.3 fl (ref 78.0–100.0)
Monocytes Absolute: 1.5 10*3/uL — ABNORMAL HIGH (ref 0.1–1.0)
Monocytes Relative: 13.8 % — ABNORMAL HIGH (ref 3.0–12.0)
Neutro Abs: 8.4 10*3/uL — ABNORMAL HIGH (ref 1.4–7.7)
Neutrophils Relative %: 78.1 % — ABNORMAL HIGH (ref 43.0–77.0)
Platelets: 728 10*3/uL — ABNORMAL HIGH (ref 150.0–400.0)
RBC: 3.19 Mil/uL — ABNORMAL LOW (ref 4.22–5.81)
RDW: 15.2 % (ref 11.5–15.5)
WBC: 10.8 10*3/uL — ABNORMAL HIGH (ref 4.0–10.5)

## 2020-06-14 LAB — BASIC METABOLIC PANEL
BUN: 14 mg/dL (ref 6–23)
CO2: 28 mEq/L (ref 19–32)
Calcium: 9 mg/dL (ref 8.4–10.5)
Chloride: 99 mEq/L (ref 96–112)
Creatinine, Ser: 1.23 mg/dL (ref 0.40–1.50)
GFR: 59.36 mL/min — ABNORMAL LOW (ref >60.00)
Glucose, Bld: 128 mg/dL — ABNORMAL HIGH (ref 70–99)
Potassium: 5.1 mEq/L (ref 3.5–5.1)
Sodium: 134 mEq/L — ABNORMAL LOW (ref 135–145)

## 2020-06-14 LAB — GLUCOSE, CAPILLARY
Glucose-Capillary: 126 mg/dL — ABNORMAL HIGH (ref 70–99)
Glucose-Capillary: 154 mg/dL — ABNORMAL HIGH (ref 70–99)
Glucose-Capillary: 109 mg/dL — ABNORMAL HIGH (ref 70–99)
Glucose-Capillary: 119 mg/dL — ABNORMAL HIGH (ref 70–99)
Glucose-Capillary: 128 mg/dL — ABNORMAL HIGH (ref 70–99)
Glucose-Capillary: 95 mg/dL (ref 70–99)
Glucose-Capillary: 97 mg/dL (ref 70–99)

## 2020-06-14 NOTE — Assessment & Plan Note (Signed)
See above.  Sounds to be regular on exam today.  Refer to cardiology.

## 2020-06-14 NOTE — Assessment & Plan Note (Signed)
Single event, provoked, no previous history.  We talked about anticoagulation dosing, see orders.  He knows to take the higher dose of Eliquis in the meantime and then reduce down to 1 pill twice a day per routine.  No bleeding.  Routine cautions given to patient.  He agrees with plan.

## 2020-06-14 NOTE — Assessment & Plan Note (Signed)
We talked about his current medications and his situation.  It makes sense to refer him back to cardiology given his history of atrial fibrillation and dissection.  He is on amiodarone 200 mg twice a day for now and I will defer this to cardiology.  I would expect cardiology to gradually decrease his dose as tolerated.  His blood pressure is reasonable and he is off hydrochlorothiazide in the meantime.  Reasonable to recheck labs today.  He has follow-up pending with [facility].  I greatly appreciate the help of all involved.  He is trying to be as active as possible with his current limitations noted.  He is using a Kleist and he has appropriate hardware and help at home.  He will update me as needed.  I did give him routine cautions in the meantime.  We can set follow-up after he has his labs resulted and follow-up at cardiology/surgery.

## 2020-06-19 ENCOUNTER — Other Ambulatory Visit: Payer: Self-pay

## 2020-06-19 ENCOUNTER — Ambulatory Visit (INDEPENDENT_AMBULATORY_CARE_PROVIDER_SITE_OTHER): Payer: Self-pay | Admitting: Cardiothoracic Surgery

## 2020-06-19 ENCOUNTER — Ambulatory Visit
Admission: RE | Admit: 2020-06-19 | Discharge: 2020-06-19 | Disposition: A | Discharge status: Home / Self Care | Payer: Medicare Other | Source: Ambulatory Visit | Attending: Cardiothoracic Surgery | Admitting: Cardiothoracic Surgery

## 2020-06-19 VITALS — BP 107/73 | HR 77 | Resp 20 | Ht 70.0 in | Wt 172.0 lb

## 2020-06-19 DIAGNOSIS — I71019 Dissection of thoracic aorta, unspecified: Secondary | ICD-10-CM

## 2020-06-19 DIAGNOSIS — I7101 Dissection of thoracic aorta: Secondary | ICD-10-CM

## 2020-06-19 DIAGNOSIS — Z9889 Other specified postprocedural states: Secondary | ICD-10-CM

## 2020-06-20 NOTE — Progress Notes (Signed)
[address]       [place] [address]             [phone]     CARDIOTHORACIC SURGERY OFFICE NOTE  Referring Provider is Lajean Saver, MD Primary Cardiologist is No primary care provider on file. PCP is Tonia Ghent, MD   HPI:  69 year old gentleman presented earlier this month with acute type a dissection.  He is now 3 weeks postoperative.  He has been doing well.  His postoperative course was complicated only by brief A. fib and a very peripheral venous thrombosis.  He is on amiodarone and Eliquis for these respective problems.  Current Outpatient Medications  Medication Sig Dispense Refill  . amiodarone (PACERONE) 200 MG tablet Take 1 tablet (200 mg total) by mouth 2 (two) times daily. 60 tablet 1  . amLODipine (NORVASC) 10 MG tablet Take 1 tablet (10 mg total) by mouth daily. 90 tablet 3  . apixaban (ELIQUIS) 5 MG TABS tablet Take 1 tablet (5 mg total) by mouth 2 (two) times daily. Take 2 tablets (10mg ) twice daily for 7 days, then 1 tablet (5mg ) twice daily 60 tablet 6  . aspirin 81 MG tablet Take 81 mg by mouth daily.      Marland Kitchen atorvastatin (LIPITOR) 10 MG tablet Take 1 tablet (10 mg total) by mouth daily. 90 tablet 3  . colchicine 0.6 MG tablet Take 0.5 tablets (0.3 mg total) by mouth 2 (two) times daily. 30 tablet 0  . Metoprolol Tartrate 37.5 MG TABS Take 37.5 mg by mouth 2 (two) times daily. 60 tablet 1  . Multiple Vitamin (MULTIVITAMIN) tablet Take 1 tablet by mouth daily.      Marland Kitchen oxcarbazepine (TRILEPTAL) 600 MG tablet Take 600 mg by mouth 2 (two) times daily.     No current facility-administered medications for this visit.      Physical Exam:   BP 107/73   Pulse 77   Resp 20   Ht 5\' 10"  (1.778 m)   Wt 78 kg   SpO2 96% Comment: RA  BMI 24.68 kg/m   General:  Well-appearing man in no acute distress  Chest:   Clear to auscultation bilaterally  CV:   Regular rate and rhythm  Incisions:  Healing well  Abdomen:  Soft  nontender  Extremities:  Mild lower extremity edema  Diagnostic Tests:  Chest x-ray with clear lung fields and pleural spaces  Impression:  Doing well after aortic dissection repair  Plan:  Follow-up in 6 months with repeat CT scan of the chest,section  abdomen,  pelvis using contrast to assess  the false lumen status post aortic dissection Continue to observe strict blood pressure control Okay to drive now; may liberalize activities involving the use of his arms Continue Eliquis for at least 6 months with a history of lower extremity DVT Stop amiodarone and colchicine now  I spent in excess of 20 minutes during the conduct of this office consultation and >50% of this time involved direct face-to-face encounter with the patient for counseling and/or coordination of their care.  Level 2                 10 minutes Level 3                 15 minutes Level 4                 25 minutes Level 5  40 minutes  B.  Murvin Natal, MD 06/20/2020 10:19 AM

## 2020-06-22 NOTE — Progress Notes (Addendum)
Patient referred by Juan Ghent, MD for aortic dissection  Subjective:   Juan Wagner, male    DOB: Nov 21, 1950, 69 y.o.   MRN: [id number]   Chief Complaint  Patient presents with  . Thoracic Aortic Dissection  . 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  He was taking a recruitment class and was doing active exercises such as the BX and push-ups, when he had sudden onset chest pain and near syncope.  He was rushed to emergency room at [facility] where he was found to have aortic dissection on work-up, details below.  He underwent emergent repair of ascending aorta aneurysm by Dr. Orvan Wagner.  Postoperative stay was largely uneventful other than brief postop A. Fib.  Since his discharge, he has slowly started walking.  On 06/12/2020, he had acute onset pain in his right leg.  Work-up showed right peroneal DVT.  He has since been on Eliquis.  He also takes aspirin 81 mg daily.  Patient is a non-smoker, does not have a family history of ascending aorta aneurysm.  He has 2 biological children, 1 son and 1 daughter.  Son Juan Wagner is present at the visit today, along with patient's wife Juan Wagner.   Patient recently Wagner by Dr. Orvan Wagner for follow-up visit.  Chest x-ray showed improving pleural effusions and opacities.  Patient has no shortness of breath, chest pain symptoms.    Current Outpatient Medications on File Prior to Visit  Medication Sig Dispense Refill  . amLODipine (NORVASC) 10 MG tablet Take 1 tablet (10 mg total) by mouth daily. 90 tablet 3  . apixaban (ELIQUIS) 5 MG TABS tablet Take 1 tablet (5 mg total) by mouth 2 (two) times daily. Take 2 tablets (60m) twice daily for 7 days, then 1 tablet (520m twice daily 60  tablet 6  . aspirin 81 MG tablet Take 81 mg by mouth daily.      . Marland Kitchentorvastatin (LIPITOR) 10 MG tablet Take 1 tablet (10 mg total) by mouth daily. 90 tablet 3  . Metoprolol Tartrate 37.5 MG TABS Take 37.5 mg by mouth 2 (two) times daily. 60 tablet 1  . Multiple Vitamin (MULTIVITAMIN) tablet Take 1 tablet by mouth daily.      . Marland Kitchenxcarbazepine (TRILEPTAL) 600 MG tablet Take 600 mg by mouth 2 (two) times daily.    . Marland Kitchenmiodarone (PACERONE) 200 MG tablet Take 1 tablet (200 mg total) by mouth 2 (two) times daily. (Patient not taking: Reported on 06/23/2020) 60 tablet 1  . colchicine 0.6 MG tablet Take 0.5 tablets (0.3 mg total) by mouth 2 (two) times daily. (Patient not taking: Reported on 06/23/2020) 30 tablet 0   No current facility-administered medications on file prior to visit.    Cardiovascular and other pertinent studies:  EKG 06/23/2020: Sinus rhythm 87 bpm First degree A-V block  Left ventricular hypertrophy Inferolateral T wave inversion, consider ischemia  Vascular [place]/22[place]: RIGHT:  - Findings consistent with acute deep vein thrombosis involving the right gastrocnemius veins.  - There is no evidence of superficial venous thrombosis.     Op note 05/29/2020 (Dr. AtOrvan Wagner REPAIR OF ACUTE ASCENDING THORACIC AORTIC DISSECTION USING HEMASHIELD PLATINUM WOVEN DOUBLE VELOUR VASCULAR GRAFT 32 MM X 30 CM (N/A) TRANSESOPHAGEAL ECHOCARDIOGRAM (  TEE) (N/A)  Echocardiogram 05/29/2020:   Mod LVH> EF 55-60% Aortic root 4.1 cm. Ascending aorta aneurysm 4.8-5.1 cm. There is a aortic dissection flap that is evident just above the aortic valve cusp, does not involve aortic root.  There appears to be intramural hematoma involving the ascending aorta.  CT Chest 05/29/2020: Changes consistent with a type a dissection of the aorta involving the anterior and descending portions with extension into the right innominate artery and into the origin of the left subclavian artery. No definitive  involvement of the common carotid artery on the left is noted. The extent of the dissection flap distally is somewhat difficult to evaluate although felt to extend into the midportion of the descending thoracic aorta. Arterial timed CTA of the chest may be helpful as clinically necessary.  Adjacent to the ascending aorta just above the aortic root is a periaortic hematoma which is better visualized on the regional images of the abdomen and pelvis film. This measures approximately 3.7 x 2.4 cm in greatest dimension but again is somewhat limited in evaluation due to the timing of the contrast bolus and lack of delayed images in the chest.  Central perivascular edema is noted within the lungs bilaterally likely related to central compression of the vasculature related to aortic changes.  No evidence of pulmonary emboli although there is significant attenuation of the lumen of the pulmonary artery related to the periaortic hematoma.  Aortic aneurysm NOS (ICD10-I71.9).  Critical Value/emergent results were called by telephone at the time of interpretation on 05/29/2020 at 11:44 am to Dr. Roosevelt Wagner, who verbally acknowledged these results.    Recent labs: 06/13/2020: Glucose 128, BUN/Cr 14/1.23. EGFR >60. Na/K 134/5.1. Rest of the CMP normal H/H 9.4/28.5. MCV 89.3. Platelets 728 HbA1C, lipids N/A   Review of Systems  Cardiovascular: Negative for chest pain, dyspnea on exertion, leg swelling, palpitations and syncope.         Vitals:   06/23/20 1036  BP: 110/78  Pulse: 93  Resp: 16  SpO2: 95%     Body mass index is 23.68 kg/m. Filed Weights   06/23/20 1036  Weight: 165 lb (74.8 kg)     Objective:   Physical Exam Vitals and nursing note reviewed.  Constitutional:      General: He is not in acute distress. Neck:     Vascular: No JVD.  Cardiovascular:     Rate and Rhythm: Normal rate and regular rhythm.     Heart sounds: Normal heart sounds. No  murmur heard.      Comments: Sternotomy scar Pulmonary:     Effort: Pulmonary effort is normal.     Breath sounds: Examination of the right-lower field reveals decreased breath sounds. Decreased breath sounds present. No wheezing or rales.  Musculoskeletal:     Right lower leg: Edema (1+) present.     Left lower leg: No edema.         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 Orvan Wagner with Hemashield platinum double velous vascular grafr 32/X30 mm, post-op Afib, provoked DVT due to decreased mobility  Thoracic aorta aneurysm: Now s/p repair after aortic dissection 05/2020. Repeat CT scan in 6 months, as per Dr. Rodena Goldmann recommendations. 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 Recommend screening biological children with echocardiogram.  Also recommend genetic counseling and testing. Continue metoprolol tartrate 37.5 mg twice daily. Start losartan 25 mg daily to reduce future dilatation. Check BMP  and lipid panel in 1 week. As result, reduce amlodipine to 5 mg daily. Can take 1/2 tab of 10 mg.  Start aspirin 81 mg daily, especially in light of ongoing Eliquis use and postoperative anemia. We will check with Dr. Orvan Wagner regarding restrictions for cardiac rehab.  Paroxysmal A. Fib: CHA2DS2VASc score 2, annual stroke risk 2% While patient was not initially on Eliquis, he is now on Eliquis for DVT anyway. Will reconsider discussion regarding anticoagulation after 3-84-monthtreatment for peroneal DVT.  DVT: Right gastrocnemius DVT, likely related to immobility post surgery. As such, we will treat this as provoked DVT.  Anticoagulation recommended for at least 3 months.    Time spent: 45 min  Thank you for referring the patient to [place] Please feel free to contact with any questions.   MNigel Mormon MD Pager: [phone]: [phone]

## 2020-06-23 ENCOUNTER — Telehealth: Payer: Self-pay | Admitting: Family Medicine

## 2020-06-23 ENCOUNTER — Ambulatory Visit: Payer: Medicare Other | Admitting: Cardiology

## 2020-06-23 ENCOUNTER — Other Ambulatory Visit: Payer: Self-pay

## 2020-06-23 ENCOUNTER — Encounter: Payer: Self-pay | Admitting: Cardiology

## 2020-06-23 VITALS — BP 110/78 | HR 93 | Resp 16 | Ht 70.0 in | Wt 165.0 lb

## 2020-06-23 DIAGNOSIS — Z9889 Other specified postprocedural states: Secondary | ICD-10-CM

## 2020-06-23 DIAGNOSIS — I10 Essential (primary) hypertension: Secondary | ICD-10-CM

## 2020-06-23 MED ORDER — AMLODIPINE BESYLATE 10 MG PO TABS: 5.0000 mg | ORAL_TABLET | Freq: Every day | ORAL | 0 refills | Status: DC

## 2020-06-23 MED ORDER — LOSARTAN POTASSIUM 25 MG PO TABS: 25.0000 mg | ORAL_TABLET | Freq: Every day | ORAL | 3 refills | Status: DC

## 2020-06-23 MED ORDER — AMLODIPINE BESYLATE 5 MG PO TABS: 5.0000 mg | ORAL_TABLET | Freq: Every day | ORAL | 3 refills | Status: DC

## 2020-06-23 NOTE — Telephone Encounter (Signed)
I sent the new rx.  Please update me as needed.  Thanks.

## 2020-06-23 NOTE — Telephone Encounter (Signed)
Pt wife called in due to he went to his cardiologist and they want to have his prescripton for amlodipine from 10mg  to 5mg .  Please advise.  [facility] [address].

## 2020-06-23 NOTE — Telephone Encounter (Signed)
Please Advise

## 2020-06-27 ENCOUNTER — Other Ambulatory Visit (INDEPENDENT_AMBULATORY_CARE_PROVIDER_SITE_OTHER): Payer: Medicare Other

## 2020-06-27 ENCOUNTER — Other Ambulatory Visit (HOSPITAL_COMMUNITY): Payer: Self-pay | Admitting: Cardiology

## 2020-06-27 ENCOUNTER — Other Ambulatory Visit: Payer: Self-pay

## 2020-06-27 DIAGNOSIS — I7101 Dissection of thoracic aorta: Secondary | ICD-10-CM | POA: Diagnosis not present

## 2020-06-27 DIAGNOSIS — I71019 Dissection of thoracic aorta, unspecified: Secondary | ICD-10-CM

## 2020-06-27 LAB — CBC WITH DIFFERENTIAL/PLATELET
Basophils Absolute: 0.1 10*3/uL (ref 0.0–0.1)
Basophils Relative: 0.8 % (ref 0.0–3.0)
Eosinophils Absolute: 0 10*3/uL (ref 0.0–0.7)
Eosinophils Relative: 0.6 % (ref 0.0–5.0)
HCT: 30.9 % — ABNORMAL LOW (ref 39.0–52.0)
Hemoglobin: 9.9 g/dL — ABNORMAL LOW (ref 13.0–17.0)
Lymphocytes Relative: 10 % — ABNORMAL LOW (ref 12.0–46.0)
Lymphs Abs: 0.8 10*3/uL (ref 0.7–4.0)
MCHC: 32 g/dL (ref 30.0–36.0)
MCV: 85.9 fl (ref 78.0–100.0)
Monocytes Absolute: 1.1 10*3/uL — ABNORMAL HIGH (ref 0.1–1.0)
Monocytes Relative: 14.2 % — ABNORMAL HIGH (ref 3.0–12.0)
Neutro Abs: 5.6 10*3/uL (ref 1.4–7.7)
Neutrophils Relative %: 74.4 % (ref 43.0–77.0)
Platelets: 422 10*3/uL — ABNORMAL HIGH (ref 150.0–400.0)
RBC: 3.6 Mil/uL — ABNORMAL LOW (ref 4.22–5.81)
RDW: 15.1 % (ref 11.5–15.5)
WBC: 7.5 10*3/uL (ref 4.0–10.5)

## 2020-06-27 LAB — BASIC METABOLIC PANEL
BUN: 15 mg/dL (ref 6–23)
CO2: 29 mEq/L (ref 19–32)
Calcium: 9.6 mg/dL (ref 8.4–10.5)
Chloride: 97 mEq/L (ref 96–112)
Creatinine, Ser: 1.19 mg/dL (ref 0.40–1.50)
GFR: 61.75 mL/min (ref >60.00)
Glucose, Bld: 94 mg/dL (ref 70–99)
Potassium: 4.2 mEq/L (ref 3.5–5.1)
Sodium: 134 mEq/L — ABNORMAL LOW (ref 135–145)

## 2020-06-28 LAB — LIPID PANEL
Chol/HDL Ratio: 2.4 ratio (ref 0.0–5.0)
Cholesterol, Total: 123 mg/dL (ref 100–199)
HDL: 51 mg/dL (ref >39)
LDL Chol Calc (NIH): 61 mg/dL (ref 0–99)
Triglycerides: 46 mg/dL (ref 0–149)
VLDL Cholesterol Cal: 11 mg/dL (ref 5–40)

## 2020-06-29 ENCOUNTER — Other Ambulatory Visit: Payer: Self-pay | Admitting: Family Medicine

## 2020-06-29 ENCOUNTER — Other Ambulatory Visit: Payer: Self-pay

## 2020-06-29 ENCOUNTER — Ambulatory Visit: Payer: Medicare Other | Admitting: Genetic Counselor

## 2020-06-29 DIAGNOSIS — D649 Anemia, unspecified: Secondary | ICD-10-CM

## 2020-07-03 ENCOUNTER — Telehealth: Payer: Self-pay | Admitting: Family Medicine

## 2020-07-03 ENCOUNTER — Other Ambulatory Visit (INDEPENDENT_AMBULATORY_CARE_PROVIDER_SITE_OTHER): Payer: Medicare Other

## 2020-07-03 DIAGNOSIS — R350 Frequency of micturition: Secondary | ICD-10-CM

## 2020-07-03 LAB — URINALYSIS, ROUTINE W REFLEX MICROSCOPIC
Bilirubin Urine: NEGATIVE
Hgb urine dipstick: NEGATIVE
Ketones, ur: NEGATIVE
Leukocytes,Ua: NEGATIVE
Nitrite: NEGATIVE
RBC / HPF: NONE SEEN (ref 0–?)
Specific Gravity, Urine: 1.02 (ref 1.000–1.030)
Total Protein, Urine: NEGATIVE
Urine Glucose: NEGATIVE
Urobilinogen, UA: 0.2 (ref 0.0–1.0)
pH: 6.5 (ref 5.0–8.0)

## 2020-07-03 NOTE — Telephone Encounter (Signed)
Spoke with pt and he stated that he has been peeing too much throughout the night time and it is putting too much wear on his body in the night time. He stated that you put him on and medication and he feel like its the medication that is causing it.  Please Advise

## 2020-07-03 NOTE — Telephone Encounter (Signed)
PT. States that he is urinating all the time. He feels that his medicine is causing him to go to often. Can someone please call him to discuss?

## 2020-07-03 NOTE — Telephone Encounter (Addendum)
Called pt.  No fevers, no burning with urination.  Urinary frequency, recent onset.  D/w pt about collecting a urine sample and hold losartan for 2 days to see if that helps.  He isn't on diuretic.   Family will come pick up a urine container.  Please send with wipes.  I put in the orders.  Thanks.

## 2020-07-03 NOTE — Addendum Note (Signed)
Addended by: Tonia Ghent on: 07/03/2020 09:20 AM   Modules accepted: Orders

## 2020-07-04 LAB — URINE CULTURE
MICRO NUMBER:: 11309780
Result:: NO GROWTH
SPECIMEN QUALITY:: ADEQUATE

## 2020-07-06 ENCOUNTER — Telehealth: Payer: Self-pay | Admitting: Family Medicine

## 2020-07-06 ENCOUNTER — Other Ambulatory Visit: Payer: Self-pay | Admitting: Cardiology

## 2020-07-06 ENCOUNTER — Telehealth: Payer: Self-pay

## 2020-07-06 DIAGNOSIS — Z9889 Other specified postprocedural states: Secondary | ICD-10-CM

## 2020-07-06 DIAGNOSIS — I711 Thoracic aortic aneurysm, ruptured, unspecified: Secondary | ICD-10-CM

## 2020-07-06 MED ORDER — LOSARTAN POTASSIUM 25 MG PO TABS: 12.5000 mg | ORAL_TABLET | Freq: Every day | ORAL | Status: DC

## 2020-07-06 NOTE — Telephone Encounter (Signed)
Spoke with pt and he stated that he has been feeling gud. I Also advised him that since he is feeling better that you would like for him to restart Losartan 1/2 tab and to keep [place] updated if any changes.  Thank you,  Leamon Arnt

## 2020-07-06 NOTE — Telephone Encounter (Signed)
If urinary sx are better off losartan, then I would try restarting 1/2 tab a day and see if he can tolerate that.  If urinary sx return, then stop med and update me.   If he can tolerate 1/2 tab a day, then update me about his BP next week.  Thanks.

## 2020-07-06 NOTE — Telephone Encounter (Signed)
Noted. Thanks.

## 2020-07-06 NOTE — Telephone Encounter (Signed)
Pt called in wanted to know about his results from his urine sample and does not have Mychart, and also wanted

## 2020-07-06 NOTE — Telephone Encounter (Signed)
Spoke with pt regarding his lab results and he stated that he is doing fine.

## 2020-07-07 ENCOUNTER — Encounter: Payer: Self-pay | Admitting: Family Medicine

## 2020-07-07 ENCOUNTER — Telehealth: Payer: Self-pay

## 2020-07-07 NOTE — Telephone Encounter (Signed)
Pt left v/m at 4:45 pm that he has had a throbbing H/A for 4 days. I spoke with pt; pt said has had sharp pain on forehead, rt side of head and goes to back of head.pt said the pain is consistent. Pt has not taken any med for the H/A. No dizziness, and SOB. Pt has slight CP when coughs up clear phlegm;pt said does not cough often and no radiation of pain. Pt has not had vision changes. Never had H/a like this before; pain level now is 7. No weakness in arms or legs. Pt checked BP this morning and BP was 131/87 lt arm and BP 142/95 in rt arm. Now BP is 130/89 P94. Pt does not have way to schedule a video visit; pt will talk with is daughter to see if she could help pt with video visit and would cb on Sat morning early to schedule sat clinic.if his daughter can help him. Pt said he has been taking all his medications as instructed and has not missed any meds. UC & ED precautions given and pt voiced understanding. Pt will cb on 07/10/20 with update on how he is doing. FYI to Dr Damita Dunnings.

## 2020-07-08 NOTE — Progress Notes (Signed)
Referring Provider: Vernell Leep, MD   Referral Reason Juan Wagner was referred for genetic consult and testing of aortopathies subsequent to a repair of an acute Type A aortic dissection in November, 2021.  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  He reports finishing his jumping jacks at the gym and began his push-ups when he felt a sense of unease. He, therefore stepped in to another room and passed out immediately. He was taken to the hospital and cardiac imaging revealed an ascending thoracic aortic aneurysm of 4.8-5.1 cm with a dissection flap above the aortic valve cusp. He reports no prior symptoms to this incident.  Traditional Risk Factors Juan Wagner reports being diagnosed with hypertension at 60 that is well-controlled with medication. He denies having hypercholesterolemia, prior aortic dissection or smoking.   Family history Juan Wagner (III.26) has a 4 year-old daughter (IV.58) and 70 year-old son (IV.3)- both lead very active lifestyles and report no limitations. Juan Wagner had an older sister (III.1) who died of cancer in her 20s and, a younger sister (III.4) who succumbed to pancreatic cancer at age 38.  His younger brother (III.3) is now 49 and had a rough past with drug abuse but now had undergone rehab. His brother and his nephews and nieces (IV.1, IV.4-IV.6) are in good health.  There is no history of heart disease in his paternal relatives. Juan Wagner's father (II.3) died of cancer in his 48s as did his older brother (II.1). Juan Wagner's mother (II.4) is 79 and in good health. There are no reports of heart disease amongst her siblings (II.5-II.11) and her parents (I.3, I.4) lived past 107.    Pre-Test Genetic Consultation Notes  Juan Wagner was counseled on the genetics of syndromes that present with aortic aneurysms and dissections, specifically Marfan syndrome (MFS), Loeys-Dietz syndrome (LDS), Vascular Ehlers-Danlos Syndrome (vEDS)  and other non-syndromic familial forms of thoracic aortic aneurysms and dissection (FTAAD). I explained to him that aortopathies are an autosomal dominant condition and hence his brother and children have a 50% chance of inheriting this condition. I explained to him that his first degree-relatives, namely brother and children should get screened for thoracic aortic aneurysms. He verbalized understanding of this.   I discussed incomplete penetrance associated with this condition i.e. not all individuals harboring a mutation will present clinically, and age-related penetrance where clinical presentation increases with advanced age.    I also reviewed variable expression and emphasized that this condition can express at any age at any level of severity in the family. Hence, it is important for his first-degree relatives to undergo CTA screening for thoracic aneurysms. He verbalized understanding of this.   We walked through the process of genetic testing.   I explained to him that genetic testing is a probabilistic test dependent upon his age and severity of presentation, presence of risk factors and importantly family history of aortic aneurysms or sudden death in first-degree relatives.  The potential outcomes of genetic testing and subsequent management of at-risk family members were discussed so as to manage expectations-  I explained to him that if a mutation is not identified, then all first-degree relatives should undergo cardiac screening for aortic aneurysms. While nearly 11% of AoD cases are idiopathic, it is unclear if he has inherited this condition or has a de novo pathogenic variant he has a genetic condition.  A negative test result can be due to limitations of the genetic test. He verbalized understanding of this.  There is also  the likelihood of identifying a "Variant of unknown significance". This result means that the variant has not been detected in a statistically significant number  of patients and/or functional studies have not been performed to verify its pathogenicity. This VUS can be tested in the family to see if it segregates with disease. If a VUS is found, first-degree relatives should get screened.  If a pathogenic variant is reported, then his first-degree family members can get tested for this variant. If they test positive, it is likely they will develop an aortic aneurysm. In light of variable expression and incomplete penetrance associated with this condition, it is not possible to predict when they will manifest clinically with an aneurysm. It is recommended that family members that test positive for the familial pathogenic variant pursue clinical screening.  Impression  In summary, Juan Wagner presents with thoracic aortic dissection at age 2 in the absence of a family history of thoracic aneurysms or sudden death at a young age amongst his paternal relatives. Since there is a high incidence of de novo mutations in the genes associated with aortopathies, it is likely that he may have a de novo pathogenic variant.    Genetic testing for the genes implicated in connective tissue disorders is recommended. This test should include the major genes that contribute to LDS, vEDS and FTAAD. The genetic test will help confirm his diagnosis and identify the genetic basis of his disease. Since this is an autosomal dominant condition, a positive test result will help identify first-degree family members that may harbor the mutation and are at risk of developing this condition. Appropriate cardiology follow-up and lifestyle management can then be directed to those genotype-positive family members.   In addition, we discussed the protections afforded by the Genetic Information Non-Discrimination Act (GINA). I explained to him that GINA protects him from losing employment or health insurance based on his genotype. However, these protections do not cover life insurance and disability. He  verbalized understanding of this and informs me that his kids do not have life insurance.  Please note that the patient has not been counseled in this visit on personal, cultural or ethical issues that he may face due to his heart condition.   Plan After a thorough discussion of the risk and benefits of genetic testing for aortopathies, Thurmond Butts states his intent to pursue genetic testing and signed the informed consent form. Blood was drawn today for  for genetic testing of aortopathies.   Lattie Corns, Ph.D, [facility] Clinical Molecular Geneticist

## 2020-07-09 NOTE — Telephone Encounter (Signed)
Please get update on patient on Monday.  Thanks.

## 2020-07-10 ENCOUNTER — Telehealth: Payer: Self-pay

## 2020-07-10 NOTE — Telephone Encounter (Signed)
I was not able to speak with pt or his wife; no answer at home # and no answer or v/m on pts or pts wife cell. I called back and spoke with pt; pt said No CP, SOB, no vision changes or dizziness. Pt has slight H/A on rt side of forehead that is not severe. Pt said he will rest and plans to see DR Damita Dunnings on 07/11/20 at 3pm. UC & ED precautions given and pt voiced understanding.

## 2020-07-10 NOTE — Telephone Encounter (Signed)
[place] Night - Client TELEPHONE ADVICE RECORD [facility] Patient Name: Juan Wagner Gender: Male DOB: Oct 02, 1950 Age: 69 Y 10 M 15 D Return Phone Number: [phone] (Primary) [place] [address] Client [facility] [place] [facility] - Client Client Site [place] Physician Renford Dills - MD Contact Type Call Who Is Calling Patient / Member / Family / Caregiver Call Type Triage / Clinical Caller Name Jabreel Chimento Relationship To Patient Daughter Return Phone Number [phone] (Primary) Chief Complaint Headache Reason for Call Symptomatic / Request for [place] states her father had open heart surgery on 11.08.2021 and has had headaches. Translation No Nurse Assessment Nurse: Jimmye Norman, RN, Ailene Ravel Date/Time Eilene Ghazi Time): 07/08/2020 9:08:37 AM Confirm and document reason for call. If symptomatic, describe symptoms. ---Caller states her father had open heart surgery on 11.08.2021 and has had headaches. Patient took some Tylenol and helped his headache. Increased urination since 11/11 (discharge). Patient denies chest pain or SOB- night sweats last night Does the patient have any new or worsening symptoms? ---Yes Will a triage be completed? ---Yes Related visit to physician within the last 2 weeks? ---Yes Does the PT have any chronic conditions? (i.e. diabetes, asthma, this includes High risk factors for pregnancy, etc.) ---Yes List chronic conditions. ---MI Is this a behavioral health or substance abuse call? ---No Guidelines Guideline Title Affirmed Question Affirmed Notes Nurse Date/Time (Eastern Time) Headache [1] New headache AND [2] age > 37 Jimmye Norman, RNAilene Ravel 07/08/2020 9:11:49 AM Disp. Time Eilene Ghazi Time) Disposition Final User 07/08/2020 9:17:06 AM See PCP within 24 Hours Yes Jimmye Norman, RN, Ricka Burdock Disagree/Comply  Disagree Caller Understands Yes PLEASE NOTE: All timestamps contained within this report are represented as [place] Standard Time. CONFIDENTIALTY NOTICE: This fax transmission is intended only for the addressee. It contains information that is legally privileged, confidential or otherwise protected from use or disclosure. If you are not the intended recipient, you are strictly prohibited from reviewing, disclosing, copying using or disseminating any of this information or taking any action in reliance on or regarding this information. If you have received this fax in error, please notify us immediately by telephone so that we can arrange for its return to [place]. Phone: [phone], Toll-Free: [phone], Fax: [phone] Page: 2 of 2 Call Id: [id number] PreDisposition Call Doctor Care Advice Given Per Guideline SEE PCP WITHIN 24 HOURS: * IF OFFICE WILL BE CLOSED: You need to be seen within the next 24 hours. A clinic or an urgent care center is often a good source of care if your doctor's office is closed or you can't get an appointment. PAIN MEDICINES: * For pain relief, you can take either acetaminophen, ibuprofen, or naproxen. * They are over-the-counter (OTC) pain drugs. You can buy them at the drugstore. * ACETAMINOPHEN - REGULAR STRENGTH TYLENOL: Take 650 mg (two 325 mg pills) by mouth every 4 to 6 hours as needed. Each Regular Strength Tylenol pill has 325 mg of acetaminophen. The most you should take each day is 3,250 mg (10 pills a day). * ACETAMINOPHEN - EXTRA STRENGTH TYLENOL: Take 1,000 mg (two 500 mg pills) every 8 hours as needed. Each Extra Strength Tylenol pill has 500 mg of acetaminophen. The most you should take each day is 3,000 mg (6 pills a day). PAIN MEDICINES - EXTRA NOTES AND WARNINGS: * Use the lowest amount of medicine that makes your pain better. * Acetaminophen is thought to be safer than  ibuprofen or naproxen in people over 37 years old. Acetaminophen is in many OTC  and prescription medicines. It might be in more than one medicine that you are taking. You need to be careful and not take an overdose. An acetaminophen overdose can hurt the liver. [employer], the company that makes Tylenol, has different dosage instructions for Tylenol in [place] and the [place]. In [place], the maximum recommended dose per day is 4,000 mg or twelve Regular-Strength (325 mg) pills. In the [place], the maximum dose per day is ten Regular-Strength (325 mg) pills. REST: * Lie down in a dark quiet place and relax until feeling better. LOCAL COLD: * Apply a cold wet washcloth or cold pack to the forehead for 20 minutes. CALL BACK IF: * Blurred vision or double vision occurs * Numbness or weakness of the face, arm or leg * Difficulty with speaking * You become worse CARE ADVICE given per Headache (Adult) guideline. Comments User: Tivis Ringer, RN Date/Time Eilene Ghazi Time): 07/08/2020 9:19:14 AM caller and patient understand importance of cardiac risk factors and state they will follow up on Monday for an appt Referrals [place] REFUSED  Called and spoke with patient 07/10/2020. Patient stated that his headache is better, but the only other complaint he has is increased urinary frequency. Patient states that he cannot sleep because he is getting up ever hour to urinate. No other complaints noted by Mr. Kowal. Patient has an appointment with Dr. Damita Dunnings on 07/11/2020.

## 2020-07-10 NOTE — Telephone Encounter (Signed)
[name] - Client TELEPHONE ADVICE RECORD [facility] Patient Name: Juan Wagner Gender: Male DOB: 1950/09/16 Age: 69 Y 10 M 17 D Return Phone Number: [phone] (Primary) Address[place] [address] Client [name] - Client Client Site [name] Physician Renford Dills - MD Contact Type Call Who Is Calling Patient / Member / Family / Caregiver Call Type Triage / Clinical Relationship To Patient Self Return Phone Number [phone] (Primary) Chief Complaint Headache Reason for Call Symptomatic / Request for [facility] states he is experiencing urination frequency, blood pressure reading at 250 a.m.130/93 then 600 a.m.at 135/93 and slight headache. Caller states his current blood pressure is 140/93 and pulse 94. Caller states he has an appointment on tomorrow at 3:00 p.m. Translation No Nurse Assessment Nurse: Sumner Boast, RN, Enid Derry Date/Time Eilene Ghazi Time): 07/10/2020 9:24:51 AM Confirm and document reason for call. If symptomatic, describe symptoms. ---Caller states he is having urination frequency every hour. No burning. No blood. B/P reading at 2:50 a.m.130/93 then 6:00 a.m.at 135/93 and slight headache. Caller states his current B/P is 140/93 and pulse 94. Caller states he has an appointment on tomorrow at 3:00 p.m. No fever - 98.4. Does the patient have any new or worsening symptoms? ---Yes Will a triage be completed? ---Yes Related visit to physician within the last 2 weeks? ---No Does the PT have any chronic conditions? (i.e. diabetes, asthma, this includes High risk factors for pregnancy, etc.) ---Yes List chronic conditions. ---Hypertension Is this a behavioral health or substance abuse call? ---No Guidelines Guideline Title Affirmed Question Affirmed Notes Nurse Date/Time Eilene Ghazi Time) Urinary Symptoms Urinating more  frequently than usual (i.e., frequency) Sumner Boast, RN, Enid Derry 07/10/2020 9:29:27 AM Disp. Time Eilene Ghazi Time) Disposition Final User 07/10/2020 9:33:07 AM See PCP within 24 Hours Yes Sumner Boast, RN, Enid Derry PLEASE NOTE: All timestamps contained within this report are represented as [place] Standard Time. CONFIDENTIALTY NOTICE: This fax transmission is intended only for the addressee. It contains information that is legally privileged, confidential or otherwise protected from use or disclosure. If you are not the intended recipient, you are strictly prohibited from reviewing, disclosing, copying using or disseminating any of this information or taking any action in reliance on or regarding this information. If you have received this fax in error, please notify us immediately by telephone so that we can arrange for its return to [place]. Phone: [phone], Toll-Free: [phone], Fax: [phone] Page: 2 of 2 Call Id: [id number] [place] Disagree/Comply Comply Caller Understands Yes PreDisposition Call Doctor Care Advice Given Per Guideline SEE PCP WITHIN 24 HOURS: CALL BACK IF: * You become worse CARE ADVICE given per Urinary Symptoms (Adult) guideline. Referrals REFERRED TO PCP OFFICE

## 2020-07-10 NOTE — Telephone Encounter (Signed)
Pt already has appt with DR Damita Dunnings on 07/11/20 at [facility].

## 2020-07-10 NOTE — Telephone Encounter (Signed)
Will see tomorrow.  This is assuming he is okay with that timeframe.  Thanks.

## 2020-07-11 ENCOUNTER — Encounter: Payer: Self-pay | Admitting: Family Medicine

## 2020-07-11 ENCOUNTER — Other Ambulatory Visit: Payer: Self-pay

## 2020-07-11 ENCOUNTER — Ambulatory Visit: Payer: Medicare Other | Admitting: Family Medicine

## 2020-07-11 DIAGNOSIS — R3915 Urgency of urination: Secondary | ICD-10-CM

## 2020-07-11 DIAGNOSIS — R519 Headache, unspecified: Secondary | ICD-10-CM

## 2020-07-11 MED ORDER — TAMSULOSIN HCL 0.4 MG PO CAPS: 0.4000 mg | ORAL_CAPSULE | Freq: Every day | ORAL | 3 refills | Status: DC

## 2020-07-11 NOTE — Progress Notes (Signed)
This visit occurred during the SARS-CoV-2 public health emergency.  Safety protocols were in place, including screening questions prior to the visit, additional usage of staff PPE, and extensive cleaning of exam room while observing appropriate contact time as indicated for disinfecting solutions.  Holding losartan didn't help with urination.  He still has urinary frequency and urgency.  He can get to [facility] but with urgency.  Urine is clear.  No stopping and starting.  He had foley while inpatient but not since.  PSA not elevated earlier this year.  Discussed.  Previous urinalysis discussed, unremarkable.  Headaches.  occ noted.  R forehead and R occiput/R side of neck.  Doesn't cross the midline.  No rash.  No ear pain.  Occ throbs but that resolves with pressure on the forehead.  No left-sided symptoms.    occ cough.  No CP except with a cough as expected.  Not SOB.  Some phlegm.    Meds, vitals, and allergies reviewed.   ROS: Per HPI unless specifically indicated in ROS section   nad ncat Neck supple, no LA Forehead not tender to palpation, scalp nontender.  Neck without stiffness.  Normal range of motion rrr ctab abd soft Well-healed incision sites noted. Extremities without edema. CN 2-12 wnl B, S/S wnl x4  30 minutes were devoted to patient care in this encounter (this includes time spent reviewing the patient's file/history, interviewing and examining the patient, counseling/reviewing plan with patient).

## 2020-07-11 NOTE — Patient Instructions (Signed)
Add on 1 flomax a day.  If lightheaded at all, stop the losartan and let me know.  Either way, update me next week.  Take care.  Glad to see you.

## 2020-07-11 NOTE — Telephone Encounter (Signed)
Noted. Thanks.

## 2020-07-12 ENCOUNTER — Telehealth (HOSPITAL_COMMUNITY): Payer: Self-pay | Admitting: *Deleted

## 2020-07-12 DIAGNOSIS — R519 Headache, unspecified: Secondary | ICD-10-CM | POA: Insufficient documentation

## 2020-07-12 DIAGNOSIS — R3915 Urgency of urination: Secondary | ICD-10-CM | POA: Insufficient documentation

## 2020-07-12 NOTE — Telephone Encounter (Signed)
Referral message to Dr. Virgina Jock regarding Cardiac rehab:  Nigel Mormon, MD  Rowe Pavy, RN He had pretty good functional capacity prior to dissection. If not insurance covered, I am okay foregoing cardiac rehab. Thank you for letting me know.   07/11/20        Previous Messages   ----- Message -----  From: Rowe Pavy, RN  Sent: 07/11/2020  3:48 PM EST  To: Nigel Mormon, MD   Dr. Virgina Jock,   Thank you for this referral for this pt to particiapte in cardiac rehab. Unfortunately the diagnosis of aortic dissection is not reimbursable by Medicare and/or medicare advantage plans for cardiac rehab phase II. I reviewed his medical history and can not find any other diagnosis that apply and are covered by medicare.  He could do our self pay maintenance program. It is independent with no telemetry, minimal supervision and no vital signs are obtained.   What are your thoughts   Thanks  Maurice Small RN, BSN  Cardiac and Pulmonary Rehab Nurse Navigator     Called and spoke to pt advising him that he does not have a medicare/medicare advantage approved for reimbursement for cardiac rehab.  Spoke with pt regarding general exercise guidelines and safety precautions.  Pt has treadmill, spin bike and elliptical.  Pt exercises for 30 minutes each day either on equipment  or walking in the neighborhood weather permitting. Pt verbalized understanding.  Cherre Huger, BSN Cardiac and Training and development officer

## 2020-07-12 NOTE — Assessment & Plan Note (Signed)
He could have residual mechanical irritation from previous Foley placement.  We talked about a trial of Flomax with cautions regarding getting lightheaded.  If it is helping with his urinary symptoms but if he is lightheaded at all then he can hold his losartan and update me.  If he does not tolerate the medication otherwise and he will stop Flomax and let me know.  He agrees to plan.

## 2020-07-12 NOTE — Assessment & Plan Note (Signed)
Normal neurologic exam. 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 This could be a compressive issue from the amount of time he spends in bed.  I think this will gradually improve.  I asked him to check his pillow in the meantime.

## 2020-07-25 ENCOUNTER — Encounter: Payer: Self-pay | Admitting: Family Medicine

## 2020-07-26 ENCOUNTER — Other Ambulatory Visit: Payer: Self-pay

## 2020-07-26 ENCOUNTER — Telehealth: Payer: Self-pay | Admitting: Family Medicine

## 2020-07-26 DIAGNOSIS — T8130XA Disruption of wound, unspecified, initial encounter: Secondary | ICD-10-CM

## 2020-07-26 DIAGNOSIS — G8918 Other acute postprocedural pain: Secondary | ICD-10-CM

## 2020-07-26 NOTE — Telephone Encounter (Signed)
Please call patient early Thursday morning about getting him evaluated for possible wound infection.  He needs to be checked sooner rather than later and I will not be in clinic to see him.  Thank you.  FYI-I routed this note to multiple people in clinic.

## 2020-07-27 ENCOUNTER — Other Ambulatory Visit: Payer: Self-pay

## 2020-07-27 ENCOUNTER — Ambulatory Visit
Admission: RE | Admit: 2020-07-27 | Discharge: 2020-07-27 | Disposition: A | Discharge status: Home / Self Care | Payer: Medicare Other | Source: Ambulatory Visit | Attending: Cardiothoracic Surgery | Admitting: Cardiothoracic Surgery

## 2020-07-27 ENCOUNTER — Ambulatory Visit (INDEPENDENT_AMBULATORY_CARE_PROVIDER_SITE_OTHER): Payer: Self-pay | Admitting: Physician Assistant

## 2020-07-27 ENCOUNTER — Other Ambulatory Visit: Payer: Medicare Other

## 2020-07-27 VITALS — BP 139/88 | HR 88 | Temp 97.8°F | Resp 20 | Ht 70.0 in | Wt 162.0 lb

## 2020-07-27 DIAGNOSIS — I71019 Dissection of thoracic aorta, unspecified: Secondary | ICD-10-CM

## 2020-07-27 DIAGNOSIS — G8918 Other acute postprocedural pain: Secondary | ICD-10-CM

## 2020-07-27 DIAGNOSIS — I7101 Dissection of thoracic aorta: Secondary | ICD-10-CM

## 2020-07-27 DIAGNOSIS — T8130XA Disruption of wound, unspecified, initial encounter: Secondary | ICD-10-CM

## 2020-07-27 MED ORDER — CEPHALEXIN 500 MG PO CAPS: 500.0000 mg | ORAL_CAPSULE | Freq: Two times a day (BID) | ORAL | 0 refills | Status: AC

## 2020-07-27 NOTE — Telephone Encounter (Signed)
Noted. Thanks.

## 2020-07-27 NOTE — Progress Notes (Signed)
[address]       [place] [address]             [phone]      Juan Wagner is a 70 y.o. male patient who is status post a sending aortic dissection repair in early November.  He presents the clinic today for a wound check.  The patient states that earlier today he noticed some creamy yellow drainage from the lower portion of his sternotomy incision and also from his lower chest tube site.   No diagnosis found. Past Medical History:  Diagnosis Date  . ED (erectile dysfunction)   . Hyperlipidemia    2018 was 184- past hx   . Hypertension   . Seizures ([place])    1st event 03/2011- no seizure in years as of 2019  . Sleep apnea    no CPAP use as of 2019   No past surgical history pertinent negatives on file.   Scheduled Meds: Current Outpatient Medications on File Prior to Visit  Medication Sig Dispense Refill  . amiodarone (PACERONE) 200 MG tablet Take 1 tablet (200 mg total) by mouth 2 (two) times daily. 60 tablet 1  . amLODipine (NORVASC) 5 MG tablet Take 1 tablet (5 mg total) by mouth daily. 90 tablet 3  . apixaban (ELIQUIS) 5 MG TABS tablet Take 1 tablet (5 mg total) by mouth 2 (two) times daily. Take 2 tablets (10mg ) twice daily for 7 days, then 1 tablet (5mg ) twice daily 60 tablet 6  . atorvastatin (LIPITOR) 10 MG tablet Take 1 tablet (10 mg total) by mouth daily. 90 tablet 3  . losartan (COZAAR) 25 MG tablet Take 0.5 tablets (12.5 mg total) by mouth daily.    . Metoprolol Tartrate 37.5 MG TABS Take 37.5 mg by mouth 2 (two) times daily. 60 tablet 1  . Multiple Vitamin (MULTIVITAMIN) tablet Take 1 tablet by mouth daily.    Marland Kitchen oxcarbazepine (TRILEPTAL) 600 MG tablet Take 600 mg by mouth 2 (two) times daily.    . tamsulosin (FLOMAX) 0.4 MG CAPS capsule Take 1 capsule (0.4 mg total) by mouth daily. 30 capsule 3   No current facility-administered medications on file prior to visit.    Allergies  Allergen Reactions  . Lisinopril Other (See Comments)     Presumed cause of cough  . Prevnar [Pneumococcal 13-Val Conj Vacc]     Local reaction.   . Quinolones     Would avoid, h/o aortic dissection.    . Viagra [Sildenafil Citrate] Other (See Comments)    headache   Active Problems:   * No active hospital problems. *  Blood pressure 139/88, pulse 88, temperature 97.8 F (36.6 C), temperature source Skin, resp. rate 20, height 5\' 10"  (1.778 m), weight 162 lb (73.5 kg), SpO2 99 %.  Subjective  Patient is here for a wound check.  He states that earlier today he noticed some creamy yellow drainage coming from the lower portion of his sternotomy incision and his chest tube site.  He is here today to get his incisions examined.  Objective   CV- NSR, no murmurs Pulm-CTA bilaterally and in all fields Ext: trace edema bilaterally Wound: Purulent drainage from the lower portion of the sternotomy incision which tracked about 2 cm per my examination.  He also had some purulent drainage from his chest tube site.  This incision did not track.  I cleaned the incisions with Betadine swabs and packed with iodoform ribbon  packing and then cover each incision with 2 x 2 gauze and tape.  Sternum was stable to palpation.  Assessment & Plan   Patient presents today for his sternotomy and chest tube wound check.  The lower portion of hysterotomy incision was packed using iodoform ribbon packing in addition to his chest tube site.  The patient is instructed to change out the packing daily.  He is to take out the old packing before showering, shower, and then packed fresh iodoform ribbon in both incisions.  I have started him on Keflex 500 mg twice daily for 7 days.  He has an appointment to follow-up in our office on Monday for a wound check.  He is to call our office if he experiences any signs of infection including chills, fevers, or more purulent drainage coming from his incisions.  He was sent home with some supplies to forearm the packing and dressing changes.   His son did agree to assist him with the dressing changes.  He did have some trace edema bilaterally on exam today and I recommended trying some TED hose which she can purchase at any pharmacy.  He can wear the hose during the day when he is moving about and to remove them at night.  He is to continue with his usual activities and maintain sternal precautions at this time.  Please follow-up next week in our office.  Sharlene Dory 07/27/2020

## 2020-07-27 NOTE — Telephone Encounter (Signed)
Pt has 1pm appt with surgeon,Dr Vickey Sages on 07/28/19. Areas still discharging purulent drainage. FYI to Dr Para March.

## 2020-07-28 ENCOUNTER — Ambulatory Visit: Payer: Medicare Other | Admitting: Family Medicine

## 2020-07-31 ENCOUNTER — Other Ambulatory Visit: Payer: Self-pay | Admitting: Family Medicine

## 2020-07-31 ENCOUNTER — Ambulatory Visit (INDEPENDENT_AMBULATORY_CARE_PROVIDER_SITE_OTHER): Payer: Self-pay | Admitting: Physician Assistant

## 2020-07-31 ENCOUNTER — Other Ambulatory Visit: Payer: Self-pay

## 2020-07-31 ENCOUNTER — Other Ambulatory Visit (INDEPENDENT_AMBULATORY_CARE_PROVIDER_SITE_OTHER): Payer: Medicare Other

## 2020-07-31 ENCOUNTER — Other Ambulatory Visit: Payer: Medicare Other

## 2020-07-31 VITALS — BP 155/92 | HR 90 | Temp 98.4°F | Resp 20 | Ht 70.0 in

## 2020-07-31 DIAGNOSIS — D649 Anemia, unspecified: Secondary | ICD-10-CM

## 2020-07-31 DIAGNOSIS — T8132XA Disruption of internal operation (surgical) wound, not elsewhere classified, initial encounter: Secondary | ICD-10-CM

## 2020-07-31 DIAGNOSIS — T8130XA Disruption of wound, unspecified, initial encounter: Secondary | ICD-10-CM

## 2020-07-31 DIAGNOSIS — Z5189 Encounter for other specified aftercare: Secondary | ICD-10-CM

## 2020-07-31 LAB — BASIC METABOLIC PANEL
BUN: 12 mg/dL (ref 6–23)
CO2: 30 mEq/L (ref 19–32)
Calcium: 9.9 mg/dL (ref 8.4–10.5)
Chloride: 100 mEq/L (ref 96–112)
Creatinine, Ser: 0.92 mg/dL (ref 0.40–1.50)
GFR: 84.03 mL/min (ref >60.00)
Glucose, Bld: 92 mg/dL (ref 70–99)
Potassium: 3.9 mEq/L (ref 3.5–5.1)
Sodium: 135 mEq/L (ref 135–145)

## 2020-07-31 LAB — CBC WITH DIFFERENTIAL/PLATELET
Basophils Absolute: 0 10*3/uL (ref 0.0–0.1)
Basophils Relative: 0.6 % (ref 0.0–3.0)
Eosinophils Absolute: 0.1 10*3/uL (ref 0.0–0.7)
Eosinophils Relative: 0.8 % (ref 0.0–5.0)
HCT: 28.5 % — ABNORMAL LOW (ref 39.0–52.0)
Hemoglobin: 9.4 g/dL — ABNORMAL LOW (ref 13.0–17.0)
Lymphocytes Relative: 21.3 % (ref 12.0–46.0)
Lymphs Abs: 1.5 10*3/uL (ref 0.7–4.0)
MCHC: 33 g/dL (ref 30.0–36.0)
MCV: 80.8 fl (ref 78.0–100.0)
Monocytes Absolute: 1 10*3/uL (ref 0.1–1.0)
Monocytes Relative: 14.6 % — ABNORMAL HIGH (ref 3.0–12.0)
Neutro Abs: 4.3 10*3/uL (ref 1.4–7.7)
Neutrophils Relative %: 62.7 % (ref 43.0–77.0)
Platelets: 399 10*3/uL (ref 150.0–400.0)
RBC: 3.53 Mil/uL — ABNORMAL LOW (ref 4.22–5.81)
RDW: 15.6 % — ABNORMAL HIGH (ref 11.5–15.5)
WBC: 6.8 10*3/uL (ref 4.0–10.5)

## 2020-07-31 LAB — IRON: Iron: 25 ug/dL — ABNORMAL LOW (ref 42–165)

## 2020-07-31 MED ORDER — FERROUS SULFATE 325 (65 FE) MG PO TABS: 325.0000 mg | ORAL_TABLET | Freq: Every day | ORAL | 1 refills | Status: DC

## 2020-07-31 NOTE — Progress Notes (Signed)
[address]       [place] [address]             [phone]      Juan Wagner is a 70 y.o. male patient presents for a wound check .    1. Visit for wound check    Past Medical History:  Diagnosis Date  . ED (erectile dysfunction)   . Hyperlipidemia    2018 was 184- past hx   . Hypertension   . Seizures ([place])    1st event 03/2011- no seizure in years as of 2019  . Sleep apnea    no CPAP use as of 2019   No past surgical history pertinent negatives on file. Scheduled Meds: Current Outpatient Medications on File Prior to Visit  Medication Sig Dispense Refill  . amiodarone (PACERONE) 200 MG tablet Take 1 tablet (200 mg total) by mouth 2 (two) times daily. 60 tablet 1  . amLODipine (NORVASC) 5 MG tablet Take 1 tablet (5 mg total) by mouth daily. 90 tablet 3  . apixaban (ELIQUIS) 5 MG TABS tablet Take 1 tablet (5 mg total) by mouth 2 (two) times daily. Take 2 tablets (10mg ) twice daily for 7 days, then 1 tablet (5mg ) twice daily 60 tablet 6  . atorvastatin (LIPITOR) 10 MG tablet Take 1 tablet (10 mg total) by mouth daily. 90 tablet 3  . cephALEXin (KEFLEX) 500 MG capsule Take 1 capsule (500 mg total) by mouth 2 (two) times daily for 7 days. 14 capsule 0  . losartan (COZAAR) 25 MG tablet Take 0.5 tablets (12.5 mg total) by mouth daily.    . Metoprolol Tartrate 37.5 MG TABS Take 37.5 mg by mouth 2 (two) times daily. 60 tablet 1  . Multiple Vitamin (MULTIVITAMIN) tablet Take 1 tablet by mouth daily.    Marland Kitchen oxcarbazepine (TRILEPTAL) 600 MG tablet Take 600 mg by mouth 2 (two) times daily.    . tamsulosin (FLOMAX) 0.4 MG CAPS capsule Take 1 capsule (0.4 mg total) by mouth daily. 30 capsule 3   No current facility-administered medications on file prior to visit.    Allergies  Allergen Reactions  . Lisinopril Other (See Comments)    Presumed cause of cough  . Prevnar [Pneumococcal 13-Val Conj Vacc]     Local reaction.   . Quinolones     Would avoid, h/o  aortic dissection.    . Viagra [Sildenafil Citrate] Other (See Comments)    headache   Active Problems:   * No active hospital problems. *  Blood pressure (!) 155/92, pulse 90, temperature 98.4 F (36.9 C), resp. rate 20, height 5\' 10"  (1.778 m), SpO2 94 %.  Subjective  The patient presents with his son today for a follow-up wound check.  Objective   Cor: NSR, no murmur Pulm: CTA bilaterally  Abd: no tenderness Wound: Slight tracking of the distal portion of the sternotomy incision about 1-1/2 cm which is quite an improvement from last week.  The lower chest tube incision does not track at all.  I remove the packing from both incisions and packed with 1/4 inch iodoform packing, 2 x 2 gauze, and tape. Ext: trace edema  No CXR  Assessment & Plan   1. the lower portion of the sternum and one of his chest tube sites is slow to heal.  He still does have some purulent drainage from both areas.  A culture was sent today of the lower portion of the  sternum incision.  He has been on 4 days of a 7 of his Keflex antibiotic therapy.  He is to continue his antibiotic at this time.  He has not had any chills, fevers, or other systemic signs of infection.  He is to continue packing both incisions with the fourth and then gyriform strips and cover with 2 x 2 gauze and tape.  He is using this once a day and less dressings become saturated then he may change it again.  My hope is that the chest tube incision will not be packing after a few days but he will need to continue packing the lower portion of the sternotomy incision.  I have arranged follow-up for him next week in which she will be done with his Keflex course and hopefully his incision will continue to heal.  If he has any symptoms of infection or the incision becomes worse he is to call our office and schedule an appointment earlier.  [name] 07/31/2020

## 2020-07-31 NOTE — Progress Notes (Signed)
Sternal [facility]

## 2020-08-03 ENCOUNTER — Telehealth: Payer: Self-pay

## 2020-08-03 LAB — WOUND CULTURE
MICRO NUMBER:: 11400740
RESULT:: NO GROWTH
SPECIMEN QUALITY:: ADEQUATE

## 2020-08-03 MED ORDER — METOPROLOL TARTRATE 37.5 MG PO TABS
37.5000 mg | ORAL_TABLET | Freq: Two times a day (BID) | ORAL | 5 refills | Status: DC
Start: 2020-08-03 — End: 2020-09-21

## 2020-08-03 NOTE — Telephone Encounter (Signed)
Needs to continue, rx sent.  Thanks.

## 2020-08-03 NOTE — Addendum Note (Signed)
Addended by: Tonia Ghent on: 08/03/2020 08:08 AM   Modules accepted: Orders

## 2020-08-03 NOTE — Telephone Encounter (Signed)
Spoke with pt and he stated that he has not picked up his medication yet but he will pick up tomorrow along with other medications.  Juan Wagner

## 2020-08-03 NOTE — Telephone Encounter (Signed)
Fax came in stating pt is requesting metoprolol but I do not see where it was prescribed by you.  Please Advise

## 2020-08-07 ENCOUNTER — Ambulatory Visit: Payer: Medicare Other

## 2020-08-10 ENCOUNTER — Other Ambulatory Visit: Payer: Self-pay

## 2020-08-10 ENCOUNTER — Ambulatory Visit: Payer: Self-pay | Admitting: Cardiothoracic Surgery

## 2020-08-10 VITALS — BP 151/90 | HR 83 | Temp 97.6°F | Resp 20 | Ht 70.0 in | Wt 164.0 lb

## 2020-08-10 DIAGNOSIS — Z9889 Other specified postprocedural states: Secondary | ICD-10-CM

## 2020-08-10 DIAGNOSIS — T8132XA Disruption of internal operation (surgical) wound, not elsewhere classified, initial encounter: Secondary | ICD-10-CM

## 2020-08-14 ENCOUNTER — Telehealth: Payer: Self-pay

## 2020-08-14 NOTE — Telephone Encounter (Signed)
Patient contacted for wound check and appointment with PA tomorrow 08/16/19.  Patient acknowledged receipt.

## 2020-08-15 ENCOUNTER — Ambulatory Visit (INDEPENDENT_AMBULATORY_CARE_PROVIDER_SITE_OTHER): Payer: Self-pay | Admitting: Physician Assistant

## 2020-08-15 ENCOUNTER — Other Ambulatory Visit: Payer: Self-pay

## 2020-08-15 DIAGNOSIS — Z5189 Encounter for other specified aftercare: Secondary | ICD-10-CM

## 2020-08-15 NOTE — Progress Notes (Signed)
[address]       [place] [address]             [phone]      Juan Wagner is a 70 y.o. male patient who presents today for a poor healing wound.    No diagnosis found. Past Medical History:  Diagnosis Date  . ED (erectile dysfunction)   . Hyperlipidemia    2018 was 184- past hx   . Hypertension   . Seizures ([place])    1st event 03/2011- no seizure in years as of 2019  . Sleep apnea    no CPAP use as of 2019   No past surgical history pertinent negatives on file. Scheduled Meds: Current Outpatient Medications on File Prior to Visit  Medication Sig Dispense Refill  . amLODipine (NORVASC) 5 MG tablet Take 1 tablet (5 mg total) by mouth daily. 90 tablet 3  . apixaban (ELIQUIS) 5 MG TABS tablet Take 1 tablet (5 mg total) by mouth 2 (two) times daily. Take 2 tablets (10mg ) twice daily for 7 days, then 1 tablet (5mg ) twice daily 60 tablet 6  . atorvastatin (LIPITOR) 10 MG tablet Take 1 tablet (10 mg total) by mouth daily. 90 tablet 3  . ferrous sulfate 325 (65 FE) MG tablet Take 1 tablet (325 mg total) by mouth daily with breakfast. 100 tablet 1  . losartan (COZAAR) 25 MG tablet Take 0.5 tablets (12.5 mg total) by mouth daily.    . Metoprolol Tartrate 37.5 MG TABS Take 37.5 mg by mouth 2 (two) times daily. 60 tablet 5  . Multiple Vitamin (MULTIVITAMIN) tablet Take 1 tablet by mouth daily.    Marland Kitchen oxcarbazepine (TRILEPTAL) 600 MG tablet Take 600 mg by mouth 2 (two) times daily.    . tamsulosin (FLOMAX) 0.4 MG CAPS capsule Take 1 capsule (0.4 mg total) by mouth daily. 30 capsule 3  . amiodarone (PACERONE) 200 MG tablet Take 1 tablet (200 mg total) by mouth 2 (two) times daily. 60 tablet 1   No current facility-administered medications on file prior to visit.    Allergies  Allergen Reactions  . Lisinopril Other (See Comments)    Presumed cause of cough  . Prevnar [Pneumococcal 13-Val Conj Vacc]     Local reaction.   . Quinolones     Would avoid, h/o  aortic dissection.    . Viagra [Sildenafil Citrate] Other (See Comments)    headache   Active Problems:   * No active hospital problems. *  There were no vitals taken for this visit.  Subjective [name] presents today for a poor healing wound.   Objective  Cor: RRR, no murmur Pulm: CTA bilaterally and in all fields Wound: Small dime sized area of the distal sternal incision with nonhealing yellow slough. Area was cleaned, no tracking. Silver nitrate applied.  Ext: no edema  Assessment & Plan   [name] is a 70 year old male patient who comes to the department today for a wound check.  He has had some previous issues with the distal portion of his sternotomy incision not healing well.  There is no evidence of cellulitis and the patient denies fever, chills, or any other signs and symptoms of infection.  Overall, he has been doing quite well at home exercising often.  He is still on sternal precautions but has been using the bike and walking frequently.  He is able to use elliptical without the arms and just legs.  Today I cleaned his wound which had some nonhealing yellow slough.  There was no tracking.  The wound is extremely superficial and about the size of a dime.  There is no need to pack the wound.  Silver nitrate was applied to the wound and it was covered with dry 2 x 2 gauze and tape. Please wait 48 hours before showering.  Follow-up in a week.  [name] 08/15/2020

## 2020-08-21 ENCOUNTER — Other Ambulatory Visit: Payer: Self-pay

## 2020-08-21 ENCOUNTER — Ambulatory Visit: Payer: Self-pay | Admitting: Cardiothoracic Surgery

## 2020-08-21 VITALS — Ht 70.0 in

## 2020-08-21 DIAGNOSIS — Z951 Presence of aortocoronary bypass graft: Secondary | ICD-10-CM

## 2020-08-21 DIAGNOSIS — Z5189 Encounter for other specified aftercare: Secondary | ICD-10-CM

## 2020-08-21 DIAGNOSIS — T8149XA Infection following a procedure, other surgical site, initial encounter: Secondary | ICD-10-CM

## 2020-08-21 DIAGNOSIS — Z9889 Other specified postprocedural states: Secondary | ICD-10-CM

## 2020-08-21 MED ORDER — CEPHALEXIN 500 MG PO CAPS: 500.0000 mg | ORAL_CAPSULE | Freq: Four times a day (QID) | ORAL | 0 refills | Status: AC

## 2020-08-21 NOTE — Progress Notes (Signed)
Patient returns for sternal wound check. He is status post replacement of ascending aorta for dissection back in the fall. He has been doing well otherwise. He denies fevers or chills. There is scant purulent drainage on a bandage daily. There is no surrounding redness or erythema in the patient denies sternal clicking  Physical exam:  Ht 5\' 10"  (1.778 m)   BMI 23.50 kg/m  Well-appearing no acute distress  clear to auscultation bilaterally The sternal wound has a small defect at the midportion. Probing with a Q-tip allows admittance of this by approximately 1 cm cephalad. This is packed with half inch plain Nu Gauze. 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 He has a small sternal defect which is superficial  Plan:  Culture swab is sent Keflex 500 mg p.o. 4 times daily x10 days Follow-up next week Pack according to instructions given above  Juan Wagner Z. Orvan Seen, [place]

## 2020-08-21 NOTE — Progress Notes (Signed)
Wound CX sent [facility]

## 2020-08-23 ENCOUNTER — Other Ambulatory Visit: Payer: Self-pay

## 2020-08-23 ENCOUNTER — Ambulatory Visit (INDEPENDENT_AMBULATORY_CARE_PROVIDER_SITE_OTHER): Payer: Self-pay | Admitting: *Deleted

## 2020-08-23 DIAGNOSIS — Z5189 Encounter for other specified aftercare: Secondary | ICD-10-CM

## 2020-08-23 NOTE — Progress Notes (Signed)
Pt arrived for nurse visit to change sternal wound packing. Per pt, he was worried he was not packing his wound deep enough. Old packing removed with scant drainage noted on dressing. Purulent drainage noted in shallow wound bed. Wound cleaned and new 1/2in plain packing placed within wound, covered with 2x2 gauze and 4x4 tegaderm. Advised pt to continue dressing changes as instructed by Dr. Orvan Seen. Pt verbalized understanding.

## 2020-08-25 LAB — WOUND CULTURE
MICRO NUMBER:: 11475728
RESULT:: NO GROWTH
SPECIMEN QUALITY:: ADEQUATE

## 2020-08-28 ENCOUNTER — Other Ambulatory Visit: Payer: Self-pay

## 2020-08-28 ENCOUNTER — Ambulatory Visit: Payer: Self-pay | Admitting: Cardiothoracic Surgery

## 2020-08-28 ENCOUNTER — Other Ambulatory Visit: Payer: Self-pay | Admitting: *Deleted

## 2020-08-28 ENCOUNTER — Encounter: Payer: Self-pay | Admitting: Cardiothoracic Surgery

## 2020-08-28 VITALS — BP 158/97 | HR 80 | Resp 20 | Ht 70.0 in | Wt 167.0 lb

## 2020-08-28 DIAGNOSIS — Z5189 Encounter for other specified aftercare: Secondary | ICD-10-CM

## 2020-08-28 DIAGNOSIS — L089 Local infection of the skin and subcutaneous tissue, unspecified: Secondary | ICD-10-CM

## 2020-08-28 DIAGNOSIS — S21101A Unspecified open wound of right front wall of thorax without penetration into thoracic cavity, initial encounter: Secondary | ICD-10-CM

## 2020-08-28 DIAGNOSIS — Z9889 Other specified postprocedural states: Secondary | ICD-10-CM

## 2020-08-28 NOTE — H&P (View-Only) (Signed)
  The patient returns for wound check.  He is status post replacement of a sending aorta.  Since last visit he has been trying to pack the small defect at the lower third of the sternum.  He denies any fevers or chills.  He states that it still been draining some  Physical exam:  BP (!) 158/97 (BP Location: Left Arm)   Pulse 80   Resp 20   Ht 5\' 10"  (1.778 m)   Wt 75.8 kg   SpO2 97% Comment: RA with mask on  BMI 23.96 kg/m  Well-appearing no acute distress Sternum is stable but there is a small defect and expressible purulent material from the inferior aspect is achieved.  Laboratory: Culture from last week was negative  Impression: Likely stitch abscess status post sternotomy for replacement of ascending aorta  Plan: Continue antibiotics as prescribed Operative debridement later this week; possible wound vac placement  Joleena Weisenburger Z. Orvan Seen, [place]

## 2020-08-28 NOTE — Progress Notes (Signed)
  The patient returns for wound check.  He is status post replacement of a sending aorta.  Since last visit he has been trying to pack the small defect at the lower third of the sternum.  He denies any fevers or chills.  He states that it still been draining some  Physical exam:  BP (!) 158/97 (BP Location: Left Arm)   Pulse 80   Resp 20   Ht 5' 10" (1.778 m)   Wt 75.8 kg   SpO2 97% Comment: RA with mask on  BMI 23.96 kg/m  Well-appearing no acute distress Sternum is stable but there is a small defect and expressible purulent material from the inferior aspect is achieved.  Laboratory: Culture from last week was negative  Impression: Likely stitch abscess status post sternotomy for replacement of ascending aorta  Plan: Continue antibiotics as prescribed Operative debridement later this week; possible wound vac placement  Runette Scifres Z. Thurmon Mizell, MD [phone] 

## 2020-08-30 ENCOUNTER — Other Ambulatory Visit (HOSPITAL_COMMUNITY)
Admission: RE | Admit: 2020-08-30 | Discharge: 2020-08-30 | Disposition: A | Discharge status: Home / Self Care | Payer: Medicare Other | Source: Ambulatory Visit | Attending: Cardiothoracic Surgery | Admitting: Cardiothoracic Surgery

## 2020-08-30 DIAGNOSIS — Z01812 Encounter for preprocedural laboratory examination: Secondary | ICD-10-CM | POA: Diagnosis present

## 2020-08-30 DIAGNOSIS — Z20822 Contact with and (suspected) exposure to covid-19: Secondary | ICD-10-CM | POA: Insufficient documentation

## 2020-08-30 LAB — SARS CORONAVIRUS 2 (TAT 6-24 HRS): SARS Coronavirus 2: NEGATIVE

## 2020-08-31 ENCOUNTER — Encounter (HOSPITAL_COMMUNITY): Payer: Self-pay | Admitting: Cardiothoracic Surgery

## 2020-08-31 NOTE — Progress Notes (Signed)
Spoke with pt for pre-op call. Pt has hx of aortic dissection in November, 2021. Pt states he is not diabetic, but is treated for HTN. He had a one time seizure in 2012 and has not had another one, pt is on Trileptal.   Pt's medication list has Amiodarone listed, but pt cannot find the pill bottle at home. I instructed him to call his physician that prescribed it for him to make sure that he still needs to take this medication. Pt voiced understanding.  Covid test done 08/30/20 and it's negative.  Pt states he's been in quarantine since the test was done and understands that he stays in quarantine until he comes to the hospital in the morning.

## 2020-08-31 NOTE — Anesthesia Preprocedure Evaluation (Addendum)
Anesthesia Evaluation  Patient identified by MRN, date of birth, ID band Patient awake    Reviewed: Allergy & Precautions, NPO status , Patient's Chart, lab work & pertinent test results, reviewed documented beta blocker date and time   History of Anesthesia Complications Negative for: history of anesthetic complications  Airway Mallampati: II  TM Distance: >3 FB Neck ROM: Full    Dental  (+) Dental Advisory Given, Caps   Pulmonary sleep apnea ,    Pulmonary exam normal        Cardiovascular hypertension, Pt. on home beta blockers and Pt. on medications + DVT  Normal cardiovascular exam   Hx thoracic aneurysm/dissection s/p repair  '21 TTE - EF 55 to 60%. Moderate left ventricular hypertrophy. The aortic root is mildly dilated at 4.1 cm. 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 There is a aortic dissection flap that is evident just above the aortic valve cusp, does not  involve aortic root. There appears to be intramural hematoma involving the ascending aorta.     Neuro/Psych  Headaches, Seizures - (last seizure ~8 yr ago), Well Controlled,  negative psych ROS   GI/Hepatic negative GI ROS, Neg liver ROS,   Endo/Other  negative endocrine ROS  Renal/GU negative Renal ROS     Musculoskeletal negative musculoskeletal ROS (+)   Abdominal   Peds  Hematology  On eliquis    Anesthesia Other Findings Covid test negative   Reproductive/Obstetrics                           Anesthesia Physical Anesthesia Plan  ASA: III  Anesthesia Plan: MAC   Post-op Pain Management:    Induction: Intravenous  PONV Risk Score and Plan: 1 and Propofol infusion and Treatment may vary due to age or medical condition  Airway Management Planned: Simple Face Mask and Natural Airway  Additional Equipment: None  Intra-op Plan:   Post-operative Plan:   Informed Consent: I have  reviewed the patients History and Physical, chart, labs and discussed the procedure including the risks, benefits and alternatives for the proposed anesthesia with the patient or authorized representative who has indicated his/her understanding and acceptance.       Plan Discussed with: CRNA and Anesthesiologist  Anesthesia Plan Comments:       Anesthesia Quick Evaluation

## 2020-08-31 NOTE — Progress Notes (Signed)
Anesthesia Chart Review: SAME DAY WORK-UP   Case: [id number] Date/Time: 09/01/20 0715   Procedures:      DEBRIDEMENT WOUND, lower part of sternum (N/A )     possible APPLICATION OF WOUND VAC (N/A )   Anesthesia type: Monitor Anesthesia Care   Pre-op diagnosis: STERNAL WOUND INFECTION   Location: [facility] OR ROOM 18 / [place] [facility]   Surgeons: Wonda Olds, MD      DISCUSSION: Patient is a 70 year old male scheduled for the above procedure. He is s/p type 1 aortic dissection repair 05/29/20. He developed a sternal wound infection in the lower incision with small amount purulent drainage despite antibiotics and local wound care. Operative debridement recommended. 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   08/30/20 COVID-19 test negative. Anesthesia team to evaluate on the day of surgery. Eliquis is listed as being on hold for procedure.    VS:  BP Readings from Last 3 Encounters:  08/28/20 (!) 158/97  08/15/20 (!) 155/97  08/10/20 (!) 151/90   Pulse Readings from Last 3 Encounters:  08/28/20 80  08/15/20 79  08/10/20 83    PROVIDERS: Tonia Ghent, MD is PCP  Vernell Leep, MD is cardiologist. Evaluation 06/23/20.    LABS: For day of surgery. As of 07/31/20, H/H 9.4/28.5, Cr 0.92, glucose 92.    IMAGES: CXR 07/27/20: FINDINGS: The heart size and mediastinal contours are within normal limits. Normal pulmonary vascularity. Unchanged small left pleural effusion. No consolidation or pneumothorax. Prior median sternotomy with intact sternal wires. No acute osseous abnormality. Surgical clips in the right axilla. IMPRESSION: 1. Unchanged small left pleural effusion.   EKG: EKG 06/23/2020: Sinus rhythm 87 bpm First degree A-V block  Left ventricular hypertrophy Inferolateral T wave  inversion, consider ischemia   CV: RLE Venous US 06/12/20: Summary:  RIGHT:  - Findings consistent with acute deep vein thrombosis involving the right  gastrocnemius veins.  - There is no evidence of superficial venous thrombosis.     Intra-op TEE (aortic dissection repair) 05/29/20: POST-OP IMPRESSIONS  - Left Ventricle: Post Repair of Ascending Aortic Dissection:  As Dr. Laurie Panda eho took over case noted the following:  RV (mod dilation, nl wall motion, nl sytolic function). TR (mild central),  LV  45% septal motion alternating between akinectic and dyskinetic with  conduction  change (sinus tachy vs bundle branch) no other wall motion abnormalities),  No  AI, Ascending graft, residual dissection distal to graft in descending  aorta, no  MR.  The TEE that had been placed after induction uneventfully was removed at  the end  of the case without difficulty. The patient was later taken to the SICU in  stable conditon.     Echo 05/29/20 (in setting of type 1 aortic dissection): IMPRESSIONS  1. Left ventricular ejection fraction, by estimation, is 55 to 60%. The  left ventricle has normal function. The left ventricle has no regional  wall motion abnormalities. There is moderate left ventricular hypertrophy.  Left ventricular diastolic  parameters were normal.  2. Right ventricular systolic function is normal. The right ventricular  size is normal. There is normal pulmonary artery systolic pressure.  3. The mitral valve is normal in structure. No evidence of mitral valve  regurgitation. No evidence of mitral stenosis.  4. The aortic  valve is normal in structure. Aortic valve regurgitation is  not visualized.  5. The aortic root is mildly dilated at 4.1 cm. The ascending aorta is  aneurysmal and measures between 4.8 to 5.1 cm. There is a aortic  dissection flap that is evident just above the aortic valve cusp, does not  involve aortic root. There appears to be   intramural hematoma involving the ascending aorta.  I Adrian Prows, MD) was present during the study and some images are not acquired..  Aortic dilatation noted.  6. The inferior vena cava is normal in size with <50% respiratory  variability, suggesting right atrial pressure of 8 mmHg.    Past Medical History:  Diagnosis Date  . 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 DVT (deep venous thrombosis) ([place]) 06/12/2020   RLE DVT  . ED (erectile dysfunction)   . Hyperlipidemia    2018 was 184- past hx   . Hypertension   . Seizures ([place])    1st event 03/2011- no seizure in years as of 2019  . Sleep apnea    no CPAP use as of 2019    Past Surgical History:  Procedure Laterality Date  . COLONOSCOPY    . KNEE SURGERY     scope L knee  . POLYPECTOMY    . REPAIR OF ACUTE ASCENDING THORACIC AORTIC DISSECTION N/A 05/29/2020   Procedure: REPAIR OF ACUTE ASCENDING THORACIC AORTIC DISSECTION USING HEMASHIELD PLATINUM WOVEN DOUBLE VELOUR VASCULAR GRAFT 32 MM X 30 CM;  Surgeon: Wonda Olds, MD;  Location: [facility];  Service: Vascular;  Laterality: N/A;  . SEPTOPLASTY     broken nose  . TEE WITHOUT CARDIOVERSION N/A 05/29/2020   Procedure: TRANSESOPHAGEAL ECHOCARDIOGRAM (TEE);  Surgeon: Wonda Olds, MD;  Location: [place];  Service: Open Heart Surgery;  Laterality: N/A;    MEDICATIONS: No current facility-administered medications for this encounter.   Marland Kitchen amiodarone (PACERONE) 200 MG tablet  . amLODipine (NORVASC) 5 MG tablet  . apixaban (ELIQUIS) 5 MG TABS tablet  . atorvastatin (LIPITOR) 10 MG tablet  . cephALEXin (KEFLEX) 500 MG capsule  . ferrous sulfate 325 (65 FE) MG tablet  . losartan (COZAAR) 25 MG tablet  . Metoprolol Tartrate 37.5 MG TABS  . Multiple Vitamin (MULTIVITAMIN) tablet  . oxcarbazepine (TRILEPTAL) 600 MG tablet  . tamsulosin (FLOMAX) 0.4 MG CAPS capsule    Myra Gianotti, PA-C Surgical Short Stay/Anesthesiology [facility] Phone [phone] [facility] Phone  [phone] 08/31/2020 2:24 PM

## 2020-09-01 ENCOUNTER — Ambulatory Visit (HOSPITAL_COMMUNITY): Payer: Medicare Other | Admitting: Vascular Surgery

## 2020-09-01 ENCOUNTER — Observation Stay (HOSPITAL_COMMUNITY)
Admission: RE | Admit: 2020-09-01 | Discharge: 2020-09-01 | Disposition: A | Discharge status: Home / Self Care | Payer: Medicare Other | Attending: Cardiothoracic Surgery | Admitting: Cardiothoracic Surgery

## 2020-09-01 ENCOUNTER — Encounter (HOSPITAL_COMMUNITY)
Admission: RE | Disposition: A | Discharge status: Home / Self Care | Payer: Self-pay | Source: Home / Self Care | Attending: Cardiothoracic Surgery

## 2020-09-01 ENCOUNTER — Encounter (HOSPITAL_COMMUNITY): Payer: Self-pay | Admitting: Cardiothoracic Surgery

## 2020-09-01 ENCOUNTER — Other Ambulatory Visit: Payer: Self-pay

## 2020-09-01 DIAGNOSIS — T8149XA Infection following a procedure, other surgical site, initial encounter: Secondary | ICD-10-CM | POA: Diagnosis not present

## 2020-09-01 DIAGNOSIS — Z9889 Other specified postprocedural states: Secondary | ICD-10-CM

## 2020-09-01 DIAGNOSIS — Y848 Other medical procedures as the cause of abnormal reaction of the patient, or of later complication, without mention of misadventure at the time of the procedure: Secondary | ICD-10-CM | POA: Insufficient documentation

## 2020-09-01 DIAGNOSIS — L089 Local infection of the skin and subcutaneous tissue, unspecified: Secondary | ICD-10-CM

## 2020-09-01 DIAGNOSIS — I4891 Unspecified atrial fibrillation: Secondary | ICD-10-CM | POA: Insufficient documentation

## 2020-09-01 DIAGNOSIS — I9789 Other postprocedural complications and disorders of the circulatory system, not elsewhere classified: Secondary | ICD-10-CM

## 2020-09-01 DIAGNOSIS — S21101A Unspecified open wound of right front wall of thorax without penetration into thoracic cavity, initial encounter: Secondary | ICD-10-CM

## 2020-09-01 DIAGNOSIS — I7101 Dissection of thoracic aorta: Secondary | ICD-10-CM | POA: Diagnosis not present

## 2020-09-01 HISTORY — PX: APPLICATION OF WOUND VAC: SHX5189

## 2020-09-01 HISTORY — PX: WOUND DEBRIDEMENT: SHX247

## 2020-09-01 LAB — COMPREHENSIVE METABOLIC PANEL
ALT: 21 U/L (ref 0–44)
AST: 23 U/L (ref 15–41)
Albumin: 3.2 g/dL — ABNORMAL LOW (ref 3.5–5.0)
Alkaline Phosphatase: 63 U/L (ref 38–126)
Anion gap: 8 (ref 5–15)
BUN: 14 mg/dL (ref 8–23)
CO2: 28 mmol/L (ref 22–32)
Calcium: 9.4 mg/dL (ref 8.9–10.3)
Chloride: 100 mmol/L (ref 98–111)
Creatinine, Ser: 1.12 mg/dL (ref 0.61–1.24)
GFR, Estimated: >60 mL/min (ref >60)
Glucose, Bld: 97 mg/dL (ref 70–99)
Potassium: 3.8 mmol/L (ref 3.5–5.1)
Sodium: 136 mmol/L (ref 135–145)
Total Bilirubin: 0.5 mg/dL (ref 0.3–1.2)
Total Protein: 7.5 g/dL (ref 6.5–8.1)

## 2020-09-01 LAB — CBC
HCT: 33.5 % — ABNORMAL LOW (ref 39.0–52.0)
Hemoglobin: 10 g/dL — ABNORMAL LOW (ref 13.0–17.0)
MCH: 25.8 pg — ABNORMAL LOW (ref 26.0–34.0)
MCHC: 29.9 g/dL — ABNORMAL LOW (ref 30.0–36.0)
MCV: 86.6 fL (ref 80.0–100.0)
Platelets: 279 10*3/uL (ref 150–400)
RBC: 3.87 MIL/uL — ABNORMAL LOW (ref 4.22–5.81)
RDW: 16.3 % — ABNORMAL HIGH (ref 11.5–15.5)
WBC: 4.6 10*3/uL (ref 4.0–10.5)
nRBC: 0 % (ref 0.0–0.2)

## 2020-09-01 LAB — SURGICAL PCR SCREEN
MRSA, PCR: NEGATIVE
Staphylococcus aureus: NEGATIVE

## 2020-09-01 LAB — APTT: aPTT: 30 seconds (ref 24–36)

## 2020-09-01 LAB — PROTIME-INR
INR: 1.2 (ref 0.8–1.2)
Prothrombin Time: 14.8 seconds (ref 11.4–15.2)

## 2020-09-01 SURGERY — DEBRIDEMENT, WOUND
Anesthesia: Monitor Anesthesia Care

## 2020-09-01 MED ORDER — ONDANSETRON HCL 4 MG/2ML IJ SOLN: 4.0000 mg | Freq: Four times a day (QID) | INTRAMUSCULAR | Status: DC | PRN

## 2020-09-01 MED ORDER — METOPROLOL TARTRATE 12.5 MG HALF TABLET
37.5000 mg | ORAL_TABLET | Freq: Two times a day (BID) | ORAL | Status: DC
  Filled 2020-09-01 (×2): qty 3

## 2020-09-01 MED ORDER — CHLORHEXIDINE GLUCONATE 0.12 % MT SOLN
15.0000 mL | Freq: Once | OROMUCOSAL | Status: AC
  Administered 2020-09-01: 15 mL via OROMUCOSAL
  Filled 2020-09-01: qty 15

## 2020-09-01 MED ORDER — TAMSULOSIN HCL 0.4 MG PO CAPS
0.4000 mg | ORAL_CAPSULE | Freq: Every day | ORAL | Status: DC
Start: 2020-09-02 — End: 2020-09-01

## 2020-09-01 MED ORDER — BUPIVACAINE HCL 0.5 % IJ SOLN
INTRAMUSCULAR | Status: DC | PRN
  Administered 2020-09-01: 17 mL

## 2020-09-01 MED ORDER — FENTANYL CITRATE (PF) 100 MCG/2ML IJ SOLN: 25.0000 ug | INTRAMUSCULAR | Status: DC | PRN

## 2020-09-01 MED ORDER — PHENYLEPHRINE HCL-NACL 10-0.9 MG/250ML-% IV SOLN
INTRAVENOUS | Status: DC | PRN
  Administered 2020-09-01: 30 ug/min via INTRAVENOUS

## 2020-09-01 MED ORDER — METOPROLOL TARTRATE 25 MG PO TABS: 37.5000 mg | ORAL_TABLET | Freq: Two times a day (BID) | ORAL | Status: DC

## 2020-09-01 MED ORDER — APIXABAN 2.5 MG PO TABS: 2.5000 mg | ORAL_TABLET | Freq: Every day | ORAL | Status: DC

## 2020-09-01 MED ORDER — OXYCODONE HCL 5 MG PO TABS
5.0000 mg | ORAL_TABLET | ORAL | 0 refills | Status: DC | PRN
Start: 2020-09-01 — End: 2020-09-21

## 2020-09-01 MED ORDER — FENTANYL CITRATE (PF) 250 MCG/5ML IJ SOLN
INTRAMUSCULAR | Status: AC
  Filled 2020-09-01: qty 5

## 2020-09-01 MED ORDER — OXYCODONE HCL 5 MG PO TABS
5.0000 mg | ORAL_TABLET | Freq: Once | ORAL | Status: DC | PRN
Start: 2020-09-01 — End: 2020-09-01

## 2020-09-01 MED ORDER — LOSARTAN POTASSIUM 25 MG PO TABS
25.0000 mg | ORAL_TABLET | Freq: Every day | ORAL | Status: DC
Start: 2020-09-02 — End: 2020-09-01

## 2020-09-01 MED ORDER — ONDANSETRON HCL 4 MG/2ML IJ SOLN: 4.0000 mg | Freq: Once | INTRAMUSCULAR | Status: DC | PRN

## 2020-09-01 MED ORDER — AMLODIPINE BESYLATE 5 MG PO TABS: 5.0000 mg | ORAL_TABLET | Freq: Every day | ORAL | Status: DC

## 2020-09-01 MED ORDER — AMIODARONE HCL 200 MG PO TABS: 200.0000 mg | ORAL_TABLET | Freq: Two times a day (BID) | ORAL | Status: DC

## 2020-09-01 MED ORDER — 0.9 % SODIUM CHLORIDE (POUR BTL) OPTIME
TOPICAL | Status: DC | PRN
  Administered 2020-09-01: 2000 mL

## 2020-09-01 MED ORDER — LACTATED RINGERS IV SOLN: INTRAVENOUS | Status: DC

## 2020-09-01 MED ORDER — ONDANSETRON HCL 4 MG/2ML IJ SOLN
INTRAMUSCULAR | Status: DC | PRN
  Administered 2020-09-01: 4 mg via INTRAVENOUS

## 2020-09-01 MED ORDER — FENTANYL CITRATE (PF) 100 MCG/2ML IJ SOLN
INTRAMUSCULAR | Status: DC | PRN
  Administered 2020-09-01: 50 ug via INTRAVENOUS

## 2020-09-01 MED ORDER — OXYCODONE HCL 5 MG PO TABS
5.0000 mg | ORAL_TABLET | ORAL | Status: DC | PRN
  Administered 2020-09-01: 5 mg via ORAL
  Filled 2020-09-01: qty 1

## 2020-09-01 MED ORDER — OXCARBAZEPINE 300 MG PO TABS
600.0000 mg | ORAL_TABLET | Freq: Two times a day (BID) | ORAL | Status: DC
  Filled 2020-09-01: qty 2

## 2020-09-01 MED ORDER — PHENYLEPHRINE 40 MCG/ML (10ML) SYRINGE FOR IV PUSH (FOR BLOOD PRESSURE SUPPORT)
PREFILLED_SYRINGE | INTRAVENOUS | Status: DC | PRN
  Administered 2020-09-01: 80 ug via INTRAVENOUS
  Administered 2020-09-01: 120 ug via INTRAVENOUS

## 2020-09-01 MED ORDER — PROPOFOL 500 MG/50ML IV EMUL
INTRAVENOUS | Status: DC | PRN
  Administered 2020-09-01: 100 ug/kg/min via INTRAVENOUS

## 2020-09-01 MED ORDER — ORAL CARE MOUTH RINSE: 15.0000 mL | Freq: Once | OROMUCOSAL | Status: AC

## 2020-09-01 MED ORDER — LACTATED RINGERS IV SOLN: INTRAVENOUS | Status: DC | PRN

## 2020-09-01 MED ORDER — OXYCODONE HCL 5 MG/5ML PO SOLN
5.0000 mg | Freq: Once | ORAL | Status: DC | PRN
Start: 2020-09-01 — End: 2020-09-01

## 2020-09-01 MED ORDER — ATORVASTATIN CALCIUM 10 MG PO TABS
10.0000 mg | ORAL_TABLET | Freq: Every day | ORAL | Status: DC
Start: 2020-09-02 — End: 2020-09-01

## 2020-09-01 MED ORDER — ACETAMINOPHEN 325 MG PO TABS: 650.0000 mg | ORAL_TABLET | Freq: Four times a day (QID) | ORAL | Status: DC | PRN

## 2020-09-01 MED ORDER — CEFAZOLIN SODIUM-DEXTROSE 2-4 GM/100ML-% IV SOLN
2.0000 g | INTRAVENOUS | Status: AC
  Administered 2020-09-01: 2 g via INTRAVENOUS
  Filled 2020-09-01: qty 100

## 2020-09-01 MED ORDER — BUPIVACAINE HCL (PF) 0.5 % IJ SOLN
INTRAMUSCULAR | Status: AC
  Filled 2020-09-01: qty 30

## 2020-09-01 SURGICAL SUPPLY — 57 items
APL SKNCLS STERI-STRIP NONHPOA (GAUZE/BANDAGES/DRESSINGS) ×1
BAG DECANTER FOR FLEXI CONT (MISCELLANEOUS) ×2 IMPLANT
BANDAGE ESMARK 6X9 LF (GAUZE/BANDAGES/DRESSINGS) IMPLANT
BENZOIN TINCTURE PRP APPL 2/3 (GAUZE/BANDAGES/DRESSINGS) ×1 IMPLANT
BLADE CLIPPER SURG (BLADE) ×2 IMPLANT
BLADE SURG 10 STRL SS (BLADE) ×2 IMPLANT
BNDG CMPR 9X6 STRL LF SNTH (GAUZE/BANDAGES/DRESSINGS)
BNDG ESMARK 6X9 LF (GAUZE/BANDAGES/DRESSINGS)
BNDG GAUZE ELAST 4 BULKY (GAUZE/BANDAGES/DRESSINGS) IMPLANT
BRUSH SCRUB EZ PLAIN DRY (MISCELLANEOUS) ×1 IMPLANT
CANISTER SUCT 3000ML PPV (MISCELLANEOUS) ×2 IMPLANT
CANISTER WOUND CARE 500ML ATS (WOUND CARE) ×2 IMPLANT
CANISTER WOUNDNEG PRESSURE 500 (CANNISTER) ×1 IMPLANT
CATH FOLEY 2WAY SLVR  5CC 16FR (CATHETERS)
CATH FOLEY 2WAY SLVR 5CC 16FR (CATHETERS) IMPLANT
CLIP VESOCCLUDE SM WIDE 24/CT (CLIP) IMPLANT
CNTNR URN SCR LID CUP LEK RST (MISCELLANEOUS) IMPLANT
CONT SPEC 4OZ STRL OR WHT (MISCELLANEOUS)
COVER SURGICAL LIGHT HANDLE (MISCELLANEOUS) ×4 IMPLANT
DRAPE INCISE IOBAN 66X45 STRL (DRAPES) IMPLANT
DRAPE LAPAROSCOPIC ABDOMINAL (DRAPES) ×2 IMPLANT
DRAPE WARM FLUID 44X44 (DRAPES) IMPLANT
DRSG AQUACEL AG ADV 3.5X14 (GAUZE/BANDAGES/DRESSINGS) ×1 IMPLANT
DRSG VAC ATS LRG SENSATRAC (GAUZE/BANDAGES/DRESSINGS) IMPLANT
DRSG VAC ATS MED SENSATRAC (GAUZE/BANDAGES/DRESSINGS) IMPLANT
DRSG VAC ATS SM SENSATRAC (GAUZE/BANDAGES/DRESSINGS) ×1 IMPLANT
ELECT REM PT RETURN 9FT ADLT (ELECTROSURGICAL) ×2
ELECTRODE REM PT RTRN 9FT ADLT (ELECTROSURGICAL) ×1 IMPLANT
GAUZE SPONGE 4X4 12PLY STRL (GAUZE/BANDAGES/DRESSINGS) ×2 IMPLANT
GAUZE XEROFORM 5X9 LF (GAUZE/BANDAGES/DRESSINGS) IMPLANT
GLOVE NEODERM STRL 7.5  LF PF (GLOVE) ×1
GLOVE NEODERM STRL 7.5 LF PF (GLOVE) ×1 IMPLANT
GLOVE SURG NEODERM 7.5  LF PF (GLOVE) ×1
GOWN STRL REUS W/ TWL LRG LVL3 (GOWN DISPOSABLE) ×3 IMPLANT
GOWN STRL REUS W/TWL LRG LVL3 (GOWN DISPOSABLE) ×6
HANDPIECE INTERPULSE COAX TIP (DISPOSABLE)
HEMOSTAT POWDER SURGIFOAM 1G (HEMOSTASIS) IMPLANT
HEMOSTAT SURGICEL 2X14 (HEMOSTASIS) IMPLANT
KIT BASIN OR (CUSTOM PROCEDURE TRAY) ×2 IMPLANT
KIT TURNOVER KIT B (KITS) ×2 IMPLANT
NS IRRIG 1000ML POUR BTL (IV SOLUTION) ×2 IMPLANT
PACK GENERAL/GYN (CUSTOM PROCEDURE TRAY) ×2 IMPLANT
PAD ARMBOARD 7.5X6 YLW CONV (MISCELLANEOUS) ×4 IMPLANT
SET HNDPC FAN SPRY TIP SCT (DISPOSABLE) IMPLANT
SOL PREP POV-IOD 4OZ 10% (MISCELLANEOUS) IMPLANT
SPONGE LAP 18X18 RF (DISPOSABLE) ×2 IMPLANT
STAPLER VISISTAT 35W (STAPLE) IMPLANT
SUT ETHILON 3 0 FSL (SUTURE) IMPLANT
SUT PDS AB 1 CTX 36 (SUTURE) IMPLANT
SUT PROLENE 2 0 MH 48 (SUTURE) IMPLANT
SUT VIC AB 2-0 CTX 27 (SUTURE) ×2 IMPLANT
SWAB COLLECTION DEVICE MRSA (MISCELLANEOUS) IMPLANT
SWAB CULTURE ESWAB REG 1ML (MISCELLANEOUS) IMPLANT
SYR 5ML LL (SYRINGE) IMPLANT
TOWEL GREEN STERILE (TOWEL DISPOSABLE) ×2 IMPLANT
TOWEL GREEN STERILE FF (TOWEL DISPOSABLE) ×2 IMPLANT
WATER STERILE IRR 1000ML POUR (IV SOLUTION) ×3 IMPLANT

## 2020-09-01 NOTE — Anesthesia Postprocedure Evaluation (Signed)
Anesthesia Post Note  Patient: Juan Wagner  Procedure(s) Performed: WOUND DEBRIDEMENT OF LOWER PART OF STERNUM (N/A ) possible APPLICATION OF WOUND VAC (N/A )     Patient location during evaluation: PACU Anesthesia Type: MAC Level of consciousness: awake and alert Pain management: pain level controlled Vital Signs Assessment: post-procedure vital signs reviewed and stable Respiratory status: spontaneous breathing, nonlabored ventilation and respiratory function stable Cardiovascular status: stable and blood pressure returned to baseline Anesthetic complications: no   No complications documented.  Last Vitals:  Vitals:   09/01/20 0826 09/01/20 0855  BP: 116/76 134/90  Pulse:  75  Resp:  17  Temp: 36.6 C 36.6 C  SpO2:  95%    Last Pain:  Vitals:   09/01/20 0855  TempSrc:   PainSc: 0-No pain                 Audry Pili

## 2020-09-01 NOTE — Discharge Summary (Signed)
[address]       [place] [address]             [phone]    Physician Discharge Summary  Patient ID: Juan Wagner MRN: [id number] DOB/AGE: 71-12-1950 71 y.o.  Admit date: 09/01/2020 Discharge date: 09/04/2020  Admission Diagnoses:  Patient Active Problem List   Diagnosis Date Noted   Post-operative state 09/01/2020   Visit for wound check 08/15/2020   Urinary urgency 07/12/2020   Headache 07/12/2020   DVT (deep venous thrombosis) ([place]) 06/14/2020   Atrial fibrillation ([place]) 06/06/2020   Aortic dissection, thoracic ([place]) 05/29/2020   Healthcare maintenance 02/04/2017   Advance care planning 12/09/2014   Sleep apnea syndrome 11/28/2014   Medicare annual wellness visit, subsequent 01/07/2012   Seizure ([place]) 04/01/2011   Partial epilepsy with impairment of consciousness, 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   ERECTILE DYSFUNCTION 02/11/2008   HLD (hyperlipidemia) 01/29/2007   Essential hypertension 01/29/2007   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   Discharge Diagnoses:  Patient Active Problem List   Diagnosis Date Noted   Post-operative state 09/01/2020   Visit for wound check 08/15/2020   Urinary urgency 07/12/2020   Headache 07/12/2020   DVT (deep venous thrombosis) ([place]) 06/14/2020   Atrial fibrillation ([place]) 06/06/2020   Aortic dissection, thoracic ([place]) 05/29/2020   Healthcare maintenance 02/04/2017   Advance care planning 12/09/2014   Sleep apnea syndrome 11/28/2014   Medicare annual wellness visit, subsequent 01/07/2012   Seizure ([place]) 04/01/2011   Partial epilepsy with impairment of consciousness, 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  He underwent Replacement of his ascending aorta on 05/29/2021.  The patient  did well post operatively.  However, the lower portion of his sternal wound not healing.  He contacted our office and has been followed with wound checks since 08/15/2020.  Despite our best efforts the sternal wound still did fully heal.  He was evaluated by Dr. Orvan Seen on 08/28/2020 who felt the patient should be taken to the operating room and underwent surgical debridement.  The risks and benefits of the procedure were explained to the patient and he was agreeable to proceed.  Hospital Course:   Mr. Strollo presented to [facility] on [facility]01/2020.  He underwent debridement of his inferior sternal wound with placement of wound vac.  This was done under sedation and the patient tolerated the procedure without difficulty.  He was admitted to the hospital under observation.  TOC was consulted to arrange wound vac and home health.  The patient remained clinically stable and he is felt medically stable for discharge home today.   Treatments: surgery: I/D Inferior portion of sternotomy with wound vac placement      Allergies as of 09/01/2020       Reactions   Lisinopril Other (See Comments)   Presumed cause of cough   Prevnar [pneumococcal 13-val Conj Vacc]    Local reaction.    Quinolones    Would avoid, h/o aortic dissection.     Viagra [sildenafil Citrate] Other (See Comments)   headache        Medication List     TAKE these medications    amiodarone 200 MG tablet Commonly known as: PACERONE Take 1 tablet (200 mg total) by mouth 2 (  two) times daily.   amLODipine 5 MG tablet Commonly known as: NORVASC Take 1 tablet (5 mg total) by mouth daily.   apixaban 5 MG Tabs tablet Commonly known as: Eliquis Take 1 tablet (5 mg total) by mouth 2 (two) times daily. Take 2 tablets (29m) twice daily for 7 days, then 1 tablet (561m twice daily What changed:  how much to take when to take this additional instructions   atorvastatin 10 MG tablet Commonly known as: LIPITOR Take 1  tablet (10 mg total) by mouth daily.   ferrous sulfate 325 (65 FE) MG tablet Take 1 tablet (325 mg total) by mouth daily with breakfast.   losartan 25 MG tablet Commonly known as: COZAAR Take 0.5 tablets (12.5 mg total) by mouth daily. What changed: how much to take   Metoprolol Tartrate 37.5 MG Tabs Take 37.5 mg by mouth 2 (two) times daily.   multivitamin tablet Take 1 tablet by mouth daily. Alive   oxcarbazepine 600 MG tablet Commonly known as: TRILEPTAL Take 600 mg by mouth 2 (two) times daily.   oxyCODONE 5 MG immediate release tablet Commonly known as: Oxy IR/ROXICODONE Take 1 tablet (5 mg total) by mouth every 4 (four) hours as needed for severe pain.   tamsulosin 0.4 MG Caps capsule Commonly known as: FLOMAX Take 1 capsule (0.4 mg total) by mouth daily.        Follow-up Information     [facility]   Specialty: [facility] Services [facility]: [facility] VAC drsg changes M/W/F- (start of care in home will be Friday 09/08/20)- arranged home wound VAC with [employer]-vac to be delivered to hospital room for bedside RN to transition pt to prior to discharge. Contact information: [phone]         [facility] Follow up on 09/04/2020.   Specialty: Cardiothoracic Surgery Why: Appointment is at 1:30, please bring wound vac sponge kit to visit.  Our office will change your wound vac Contact information: [phone]         [facility] Follow up on 09/06/2020.   Specialty: Cardiothoracic Surgery Why: Appointment is at 10:30, please bring wound vac dressing kit to office.  We will change your wound vac Contact information: [address] [phone]              Signed: ErEllwood HandlerPA-C  09/04/2020, 12:54 PM

## 2020-09-01 NOTE — TOC Transition Note (Addendum)
Transition of Care (TOC) - CM/SW Discharge Note Marvetta Gibbons RN, BSN Transitions of Care Unit 4E- RN Case Manager See Treatment Team for direct phone # Cross Coverage   Patient Details  Name: Juan Wagner MRN: [id number] Date of Birth: Apr 07, 1951  Transition of Care [facility]) CM/SW Contact:  Dawayne Patricia, RN Phone Number: 09/01/2020, 1:51 PM   Clinical Narrative:    Pt from home w/ wife, in today for sternal wound I&D with wound VAC placement. Notified this AM by PA-Erin Barrett that pt would need home wound VAC and HHRN for drsg changes while pt was still in PACU. [employer] home wound VAC form signed by Junie Panning and faxed in to [facility]- spoke with Olivia Mackie at [facility] regarding home wound VAC needs- and that pt ready for transition home once home VAC approved and delivered as well as [place] arranged, possibly today vs tomorrow pending arrangements.  Per Junie Panning requested to check with [facility] to see if they could assist as they have established protocols with them for [facility] needs. Call made to Amy with [facility] however they are unable to assist with [facility] needs due to current staffing conditions.  Pt transitioned from PACU to 6N.  1330- call made to pt's room and spoke with son, discussed transition needs, and current situation and where things are at with awaiting approval for home wound VAC and working on securing [facility] for nursing needs. Choice offered for [facility] - per son they do not have a preference and will defer to CM to secure an agency on patient's behalf.   Confirmed home address as: [address], [place] [address] Phone: [phone]  Call made to [facility] for [facility] referral- [facility] needs- per Tommi Rumps with [facility] due to current RN staffing they can accept referral however can not do a start of care until next Friday 2/18. Call made to Sandy with [facility] to see if they can do VAC drsg changes in the office for m/w of next week until [facility] can start- she confirmed this was possible and will arrange appointments for  pt to come to office m/w for [facility] changes- with plan for [facility] to start drsg changes on 2/18.   Await word from [facility] on home wound VAC approval and delivery timing, will update pt/son when [employer] confirms as well as [facility] for possible transition home later today.   1500- update- received word from [facility] has been approved and released for delivery- per [place] delivery should be before 5pm today here to bedside- have called and updated son at the bedside as well as Donielle with [facility]- pt will be ready for transition home once home VAC arrives- bedside RN can transition pt to home [facility] prior to leaving for home.    Final next level of care: [place]     Patient Goals and CMS Choice Patient states their goals for this hospitalization and ongoing recovery are:: return home CMS Medicare.gov Compare Post Acute Care list provided to:: Patient Choice offered to / list presented to : [place]  Discharge Placement               Home with [facility]        Discharge Plan and Services   Discharge Planning Services: CM Consult Post Acute Care Choice: [place]          DME Arranged: Vac DME Agency: [employer] Date DME Agency Contacted: 09/01/20 Time DME Agency Contacted: [phone] Representative  spoke with at DME Agency: [place]: RN [facility] Agency: [place] Date [place]: 09/01/20 Time [place]: [place] Representative spoke with at [place]: [place] ([place]) Interventions     Readmission Risk Interventions Readmission Risk Prevention Plan 06/06/2020  Post Dischage Appt Complete  Medication Screening Complete  Transportation Screening Complete  Some recent data might be hidden

## 2020-09-01 NOTE — Brief Op Note (Signed)
09/01/2020  9:01 AM  PATIENT:  Juan Wagner  70 y.o. male  PRE-OPERATIVE DIAGNOSIS:  STERNAL WOUND INFECTION  POST-OPERATIVE DIAGNOSIS:  STERNAL WOUND Granuloma  PROCEDURE:  Procedure(s): WOUND DEBRIDEMENT OF LOWER PART OF STERNUM (N/A) possible APPLICATION OF WOUND VAC (N/A)  SURGEON:  Surgeon(s) and Role:    * Wonda Olds, MD - Primary  PHYSICIAN ASSISTANT: n/a  ASSISTANTS: staff   ANESTHESIA:   MAC  EBL:  minimal   BLOOD ADMINISTERED:none  DRAINS: none   LOCAL MEDICATIONS USED:  MARCAINE     SPECIMEN:  No Specimen  DISPOSITION OF SPECIMEN:  N/A  COUNTS:  YES  TOURNIQUET:  * No tourniquets in log *  DICTATION: .Note written in EPIC  PLAN OF CARE: Discharge to home after PACU  PATIENT DISPOSITION:  PACU - hemodynamically stable.   Delay start of Pharmacological VTE agent (>24hrs) due to surgical blood loss or risk of bleeding: no  Wound dimensions: 6 cm length x 2 cm wide; x 2 cm deep

## 2020-09-01 NOTE — Progress Notes (Signed)
Pt received wound vac and education by [employer]. -Pt and family understood instruction and will call if any question occur. -Pt received d/c instruction and iv d/c

## 2020-09-01 NOTE — Transfer of Care (Signed)
Immediate Anesthesia Transfer of Care Note  Patient: Juan Wagner  Procedure(s) Performed: WOUND DEBRIDEMENT OF LOWER PART OF STERNUM (N/A ) possible APPLICATION OF WOUND VAC (N/A )  Patient Location: PACU  Anesthesia Type:MAC  Level of Consciousness: awake, alert  and oriented  Airway & Oxygen Therapy: Patient Spontanous Breathing and Patient connected to face mask oxygen  Post-op Assessment: Report given to RN and Post -op Vital signs reviewed and stable  Post vital signs: Reviewed and stable  Last Vitals:  Vitals Value Taken Time  BP    Temp    Pulse 71 09/01/20 0827  Resp 18 09/01/20 0827  SpO2 95 % 09/01/20 0827  Vitals shown include unvalidated device data.  Last Pain:  Vitals:   09/01/20 0610  TempSrc: Oral  PainSc: 0-No pain         Complications: No complications documented.

## 2020-09-01 NOTE — Progress Notes (Signed)
Notified by Junie Panning with [facility] need for home wound VAC, [employer] home wound VAC order form signed by [facility] and has been faxed to [facility] to start insurance approval. Spoke with Olivia Mackie at [facility] regarding need for home wound VAC with plan to transition home as soon as VAC is approved and delivered here to hospital. CM is working on [facility] needs, have reached out to [facility] as per request by [facility] as they have working protocols with [facility]. Spoke with Amy who will f/u on referral for [facility] needs.

## 2020-09-01 NOTE — H&P (Signed)
History and Physical Interval Note:  09/01/2020 7:23 AM  [name]  has presented today for surgery, with the diagnosis of STERNAL WOUND INFECTION.  The various methods of treatment have been discussed with the patient and family. After consideration of risks, benefits and other options for treatment, the patient has consented to  Procedure(s): DEBRIDEMENT WOUND, lower part of sternum (N/A) possible APPLICATION OF WOUND VAC (N/A) as a surgical intervention.  The patient's history has been reviewed, patient examined, no change in status, stable for surgery.  I have reviewed the patient's chart and labs.  Questions were answered to the patient's satisfaction.     Juan Wagner

## 2020-09-01 NOTE — Anesthesia Procedure Notes (Signed)
Procedure Name: MAC Date/Time: 09/01/2020 7:30 AM Performed by: Griffin Dakin, CRNA Pre-anesthesia Checklist: Patient identified, Emergency Drugs available, Suction available and Patient being monitored Patient Re-evaluated:Patient Re-evaluated prior to induction Oxygen Delivery Method: Simple face mask Preoxygenation: Pre-oxygenation with 100% oxygen Induction Type: IV induction Airway Equipment and Method: Oral airway Placement Confirmation: positive ETCO2 and breath sounds checked- equal and bilateral Dental Injury: Teeth and Oropharynx as per pre-operative assessment

## 2020-09-01 NOTE — Op Note (Signed)
Procedure(s): WOUND DEBRIDEMENT OF LOWER PART OF STERNUM possible APPLICATION OF WOUND VAC Procedure Note  Juan Wagner male 70 y.o. 09/01/2020  Procedure(s) and Anesthesia Type:    * WOUND DEBRIDEMENT OF LOWER PART OF STERNUM - Monitor Anesthesia Care    * possible APPLICATION OF WOUND VAC - Monitor Anesthesia Care  Surgeon(s) and Role:    * Wonda Olds, MD - Primary   Indications: The patient was admitted to the hospital with history of sternal wound defect status post median sternotomy for repair of type a dissection back in November 2021.        Surgeon: Wonda Olds   Assistants: Staff  Anesthesia: Monitored Local Anesthesia with Sedation  ASA Class: 3    Procedure Detail  WOUND DEBRIDEMENT OF LOWER PART OF STERNUM, possible APPLICATION OF WOUND VAC After informed consent, the patient was taken to the operating room on the above date.  He is placed in the supine position.  Anesthesia was begun with IV sedation technique.  The anterior chest was cleansed and draped with Betadine.  A preop surgical pause was performed.  The area of the lower sternum corresponding to a small defect and fluctuance is opened sharply.  There is no purulent material identified.  There is granulomatous appearing inflammation in the superficial sternal tissues.  This is sharply debrided with excision.  Copious irrigation is undertaken.  A VAC foam is cut to fit the defect.  This is then covered with a VAC drape.  This concluded the procedure.  All sponge instrument and needle counts were correct.  The size of the defect resulting was 6 cm in length by 2 cm in width by 2 cm in depth Estimated Blood Loss:  Minimal         Drains: None         Blood Given: none          Specimens: None         Implants: none        Complications:  * No complications entered in OR log *         Disposition: PACU - hemodynamically stable.         Condition: stable

## 2020-09-02 ENCOUNTER — Encounter (HOSPITAL_COMMUNITY): Payer: Self-pay | Admitting: Cardiothoracic Surgery

## 2020-09-04 ENCOUNTER — Ambulatory Visit: Payer: Medicare Other | Admitting: Cardiothoracic Surgery

## 2020-09-04 ENCOUNTER — Other Ambulatory Visit: Payer: Self-pay

## 2020-09-04 ENCOUNTER — Encounter: Payer: Self-pay | Admitting: Cardiothoracic Surgery

## 2020-09-04 VITALS — BP 172/74 | HR 78 | Resp 20

## 2020-09-04 DIAGNOSIS — Z5189 Encounter for other specified aftercare: Secondary | ICD-10-CM

## 2020-09-04 NOTE — Progress Notes (Signed)
The patient returns for wound VAC change.  He is status post operative debridement last Friday.  There have been no issues at home with the [facility] dressing.  Physical exam: Wound VAC foam is taken down and the wound is inspected.  There is good granulation tissue within the bed of the wound.  A VAC foam was cut to fit the defect and covered with VAC drape.  Impression: Status post operative debridement of sternal wound defect.  Now treated with VAC therapy  Plan: Return on Wednesday for VAC change  Juan Mergen Z. Orvan Seen, [place]

## 2020-09-06 ENCOUNTER — Ambulatory Visit: Payer: Self-pay | Admitting: *Deleted

## 2020-09-06 ENCOUNTER — Other Ambulatory Visit: Payer: Self-pay

## 2020-09-06 DIAGNOSIS — Z5189 Encounter for other specified aftercare: Secondary | ICD-10-CM

## 2020-09-06 NOTE — Progress Notes (Signed)
Pt arrived for wound vac change. Vac sponge changed by Dr. Orvan Seen. Pt to come back Friday for another wound check and vac change.

## 2020-09-07 ENCOUNTER — Other Ambulatory Visit (INDEPENDENT_AMBULATORY_CARE_PROVIDER_SITE_OTHER): Payer: Medicare Other

## 2020-09-07 DIAGNOSIS — D649 Anemia, unspecified: Secondary | ICD-10-CM

## 2020-09-07 LAB — CBC WITH DIFFERENTIAL/PLATELET
Basophils Absolute: 0 10*3/uL (ref 0.0–0.1)
Basophils Relative: 1 % (ref 0.0–3.0)
Eosinophils Absolute: 0.1 10*3/uL (ref 0.0–0.7)
Eosinophils Relative: 1.1 % (ref 0.0–5.0)
HCT: 33 % — ABNORMAL LOW (ref 39.0–52.0)
Hemoglobin: 10.7 g/dL — ABNORMAL LOW (ref 13.0–17.0)
Lymphocytes Relative: 28.6 % (ref 12.0–46.0)
Lymphs Abs: 1.4 10*3/uL (ref 0.7–4.0)
MCHC: 32.4 g/dL (ref 30.0–36.0)
MCV: 81.5 fl (ref 78.0–100.0)
Monocytes Absolute: 0.7 10*3/uL (ref 0.1–1.0)
Monocytes Relative: 15.1 % — ABNORMAL HIGH (ref 3.0–12.0)
Neutro Abs: 2.6 10*3/uL (ref 1.4–7.7)
Neutrophils Relative %: 54.2 % (ref 43.0–77.0)
Platelets: 277 10*3/uL (ref 150.0–400.0)
RBC: 4.05 Mil/uL — ABNORMAL LOW (ref 4.22–5.81)
RDW: 17.9 % — ABNORMAL HIGH (ref 11.5–15.5)
WBC: 4.8 10*3/uL (ref 4.0–10.5)

## 2020-09-07 LAB — IRON: Iron: 59 ug/dL (ref 42–165)

## 2020-09-07 NOTE — Discharge Summary (Signed)
      [address]       [place] [address]             [phone]        Please see previous discharge summary for hospital information.    However discharge date is incorrect.  The patient was discharged on 09/01/2020.  Ellwood Handler, PA-C

## 2020-09-07 NOTE — Progress Notes (Signed)
The patient returns for wound check.  He is status post replacement of the ascending aorta for dissection in late November 2021.  He has been doing well except has developed a superficial sternal defect at the midportion.  This tracks somewhat deeply.  He denies any fevers or chills  Physical exam: BP (!) 151/90   Pulse 83   Temp 97.6 F (36.4 C) (Skin)   Resp 20   Ht 5\' 10"  (1.778 m)   Wt 74.4 kg   SpO2 100% Comment: RA  BMI 23.53 kg/m  Well-appearing no acute distress Clear to auscultation bilaterally Regular rate and rhythm Wound examination shows well-healed incisions superiorly and inferiorly but a small defect in the midportion.  There is scant amount of purulent material that is a evacuated.  This is sent for culture.  The wound is packed with 1 inch Nu Gauze.  Impression: Superficial sternal defect status post replacement of ascending aorta.  Suspect stitch abscess  Plan: The patient is instructed to pack the wound himself at home.  He will follow up in 1 week.  Lennette Fader Z. Orvan Seen, [place]

## 2020-09-08 ENCOUNTER — Ambulatory Visit: Payer: Self-pay | Admitting: *Deleted

## 2020-09-08 ENCOUNTER — Other Ambulatory Visit: Payer: Self-pay | Admitting: Family Medicine

## 2020-09-08 ENCOUNTER — Other Ambulatory Visit: Payer: Self-pay | Admitting: Cardiothoracic Surgery

## 2020-09-08 ENCOUNTER — Other Ambulatory Visit: Payer: Self-pay

## 2020-09-08 DIAGNOSIS — Z951 Presence of aortocoronary bypass graft: Secondary | ICD-10-CM | POA: Diagnosis not present

## 2020-09-08 DIAGNOSIS — Z5189 Encounter for other specified aftercare: Secondary | ICD-10-CM

## 2020-09-08 DIAGNOSIS — D649 Anemia, unspecified: Secondary | ICD-10-CM

## 2020-09-08 DIAGNOSIS — T8149XD Infection following a procedure, other surgical site, subsequent encounter: Secondary | ICD-10-CM | POA: Diagnosis not present

## 2020-09-08 NOTE — Progress Notes (Signed)
Mr. Hightower presented for RN wound vac change. Wound bed pink in color. Surrounding skin wiped with skin protectant. Sponge changed in two locations with a bridge created to connect the two. Mr. Chalfin tolerated vac change without any complaints. Pt to f/u with Dr. Orvan Seen in two weeks. Home health nurse to come out Monday to change vac. No further questions at this time.

## 2020-09-11 ENCOUNTER — Ambulatory Visit: Payer: Medicare Other | Admitting: Cardiothoracic Surgery

## 2020-09-11 NOTE — Interval H&P Note (Signed)
History and Physical Interval Note:  09/11/2020 9:37 AM  Juan Wagner  has presented today for surgery, with the diagnosis of STERNAL WOUND INFECTION.  The various methods of treatment have been discussed with the patient and family. After consideration of risks, benefits and other options for treatment, the patient has consented to  Procedure(s): WOUND DEBRIDEMENT OF LOWER PART OF STERNUM (N/A) possible APPLICATION OF WOUND VAC (N/A) as a surgical intervention.  The patient's history has been reviewed, patient examined, no change in status, stable for surgery.  I have reviewed the patient's chart and labs.  Questions were answered to the patient's satisfaction.     Wonda Olds

## 2020-09-11 NOTE — H&P (Signed)
Subjective:     Juan Wagner is a 70 y.o. male who presents for evaluation of superficial sternal defect. He is status post median sternotomy for repair of type a aortic dissection a back in November 2021.  The defect is small and at the midportion of the prior incision.  This drains intermittently.  He has no sternal pain associated with this.  He denies any fevers or chills.  The patient has been treated in the office several times with wound packing and debridement but this has not been successful in curing the defect.  He is taken the operating room for wound examination and wound VAC placement.    Review of Systems Pertinent items noted in HPI and remainder of comprehensive ROS otherwise negative.    Objective:    BP 138/84   Pulse 73   Temp 97.8 F (36.6 C)   Resp 17   Ht 5\' 10"  (1.778 m)   Wt 74.8 kg   SpO2 100%   BMI 23.68 kg/m   General:  alert and cooperative  Skin:  normal  Eyes: negative, conjunctivae/corneas clear. PERRL, EOM's intact. Fundi benign.  Mouth: MMM no lesions  Lymph Nodes:  Cervical, supraclavicular, and axillary nodes normal.  Lungs:  clear to auscultation bilaterally  Heart:  regular rate and rhythm, S1, S2 normal, no murmur, click, rub or gallop  Abdomen: soft, non-tender; bowel sounds normal; no masses,  no organomegaly  CVA:  absent  Genitourinary: defer exam  Extremities:  extremities normal, atraumatic, no cyanosis or edema  Neurologic:  Alert and oriented x3. Gait normal. Reflexes and motor strength normal and symmetric. Cranial nerves 2-12 and sensation grossly intact.  Psychiatric:  normal mood, behavior, speech, dress, and thought processes     Assessment:    70 year old man status post median sternotomy for repair of type a dissection with a small and superficial sternal defect Plan:    1. Discussed the risk of surgery including bleeding,  and the risks of general anesthetic including MI, CVA, sudden death or even reaction to  anesthetic medications. The patient understands the risks, any and all questions were answered to the patient's satisfaction. 2.  Plan surgical debridement  Kainalu Heggs Z. Orvan Seen, [place]

## 2020-09-21 ENCOUNTER — Encounter: Payer: Self-pay | Admitting: Cardiology

## 2020-09-21 ENCOUNTER — Other Ambulatory Visit: Payer: Self-pay

## 2020-09-21 ENCOUNTER — Ambulatory Visit: Payer: Medicare Other | Admitting: Cardiology

## 2020-09-21 VITALS — BP 149/99 | HR 73 | Temp 98.0°F | Resp 16 | Ht 70.0 in | Wt 168.0 lb

## 2020-09-21 DIAGNOSIS — I10 Essential (primary) hypertension: Secondary | ICD-10-CM

## 2020-09-21 DIAGNOSIS — I82509 Chronic embolism and thrombosis of unspecified deep veins of unspecified lower extremity: Secondary | ICD-10-CM

## 2020-09-21 DIAGNOSIS — Z9889 Other specified postprocedural states: Secondary | ICD-10-CM

## 2020-09-21 MED ORDER — LOSARTAN POTASSIUM 25 MG PO TABS: 25.0000 mg | ORAL_TABLET | Freq: Every day | ORAL | 1 refills | Status: DC

## 2020-09-21 MED ORDER — METOPROLOL SUCCINATE ER 100 MG PO TB24: 100.0000 mg | ORAL_TABLET | Freq: Every day | ORAL | 3 refills | Status: DC

## 2020-09-21 MED ORDER — APIXABAN 5 MG PO TABS: 5.0000 mg | ORAL_TABLET | Freq: Two times a day (BID) | ORAL | 1 refills | Status: DC

## 2020-09-21 NOTE — Progress Notes (Signed)
Patient referred by Tonia Ghent, MD for aortic dissection  Subjective:   Juan Wagner, male    DOB: [birth year], 70 y.o.   MRN: [id number]   Chief Complaint  Patient presents with  . S/P aortic dissection repair  . Atrial Fibrillation  . Hypertension  . 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  He was taking a recruitment class and was doing active exercises such as the BX and push-ups, when he had sudden onset chest pain and near syncope.  He was rushed to emergency room at [facility] where he was found to have aortic dissection on work-up, details below.  He underwent emergent repair of ascending aorta aneurysm by Dr. Orvan Seen.  Postoperative stay was largely uneventful other than brief postop A. Fib.  Patient was admitted in 08/2020 with nonhealing sternal wound.  He underwent surgical debridement by Dr. Orvan Seen.  He was discharged with wound VAC, which is now removed.  His blood pressure remains elevated.  He admits to eating high salt diet at times.   Current Outpatient Medications on File Prior to Visit  Medication Sig Dispense Refill  . amLODipine (NORVASC) 5 MG tablet Take 1 tablet (5 mg total) by mouth daily. 90 tablet 3  . apixaban (ELIQUIS) 5 MG TABS tablet Take 1 tablet (5 mg total) by mouth 2 (two) times daily. Take 2 tablets (39m) twice daily for 7 days, then 1 tablet (536m twice daily (Patient taking differently: Take 2.5 mg by mouth daily.) 60 tablet 6  . atorvastatin (LIPITOR) 10 MG tablet Take 1 tablet (10 mg total) by mouth daily. 90 tablet 3  . ferrous sulfate 325 (65 FE) MG tablet Take 1 tablet (325 mg total) by mouth daily with breakfast. 100 tablet 1  . hydrochlorothiazide (HYDRODIURIL) 25 MG  tablet Take 1 tablet by mouth daily.    . Marland Kitchenosartan (COZAAR) 25 MG tablet Take 0.5 tablets (12.5 mg total) by mouth daily. (Patient taking differently: Take 25 mg by mouth daily.)    . Metoprolol Tartrate 37.5 MG TABS Take 37.5 mg by mouth 2 (two) times daily. 60 tablet 5  . Multiple Vitamin (MULTIVITAMIN) tablet Take 1 tablet by mouth daily. Alive    . oxcarbazepine (TRILEPTAL) 600 MG tablet Take 600 mg by mouth 2 (two) times daily.    . tamsulosin (FLOMAX) 0.4 MG CAPS capsule Take 1 capsule (0.4 mg total) by mouth daily. 30 capsule 3   No current facility-administered medications on file prior to visit.    Cardiovascular and other pertinent studies:  EKG 06/23/2020: Sinus rhythm 87 bpm First degree A-V block  Left ventricular hypertrophy Inferolateral T wave inversion, consider ischemia  Vascular [place]/22/2021: RIGHT:  - Findings consistent with acute deep vein thrombosis involving the right gastrocnemius veins.  - There is no evidence of superficial venous thrombosis.     Op note 05/29/2020 (Dr. AtOrvan Seen REPAIR OF ACUTE ASCENDING THORACIC AORTIC DISSECTION USING HEMASHIELD PLATINUM WOVEN DOUBLE VELOUR VASCULAR GRAFT 32 MM X 30 CM (N/A) TRANSESOPHAGEAL ECHOCARDIOGRAM (TEE) (N/A)  Echocardiogram 05/29/2020:   Mod LVH> EF 55-60% Aortic root 4.1 cm. Ascending aorta aneurysm 4.8-5.1 cm. There is a aortic dissection flap that is evident just above the aortic valve cusp,  does not involve aortic root.  There appears to be intramural hematoma involving the ascending aorta.  CT Chest 05/29/2020: Changes consistent with a type a dissection of the aorta involving the anterior and descending portions with extension into the right innominate artery and into the origin of the left subclavian artery. No definitive involvement of the common carotid artery on the left is noted. The extent of the dissection flap distally is somewhat difficult to evaluate although felt to extend into the  midportion of the descending thoracic aorta. Arterial timed CTA of the chest may be helpful as clinically necessary.  Adjacent to the ascending aorta just above the aortic root is a periaortic hematoma which is better visualized on the regional images of the abdomen and pelvis film. This measures approximately 3.7 x 2.4 cm in greatest dimension but again is somewhat limited in evaluation due to the timing of the contrast bolus and lack of delayed images in the chest.  Central perivascular edema is noted within the lungs bilaterally likely related to central compression of the vasculature related to aortic changes.  No evidence of pulmonary emboli although there is significant attenuation of the lumen of the pulmonary artery related to the periaortic hematoma.  Aortic aneurysm NOS (ICD10-I71.9).  Critical Value/emergent results were called by telephone at the time of interpretation on 05/29/2020 at 11:44 am to Dr. Roosevelt Locks, who verbally acknowledged these results.    Recent labs: 09/07/2020: Glucose 97, BUN/Cr 14/1.12. EGFR >60. Na/K 136/3.8.  Albumin 3.2.  Rest of the CMP normal H/H 10.7/33.0. MCV 81. Platelets 277  06/13/2020: Glucose 128, BUN/Cr 14/1.23. EGFR >60. Na/K 134/5.1. Rest of the CMP normal H/H 9.4/28.5. MCV 89.3. Platelets 728 HbA1C, lipids N/A   Review of Systems  Cardiovascular: Negative for chest pain, dyspnea on exertion, leg swelling, palpitations and syncope.         Vitals:   09/21/20 1323 09/21/20 1327  BP: (!) 150/98 (!) 149/99  Pulse: 71 73  Resp: 16   Temp: 98 F (36.7 C)   SpO2: 97%      Body mass index is 24.11 kg/m. Filed Weights   09/21/20 1323  Weight: 168 lb (76.2 kg)     Objective:   Physical Exam Vitals and nursing note reviewed.  Constitutional:      General: He is not in acute distress. Neck:     Vascular: No JVD.  Cardiovascular:     Rate and Rhythm: Normal rate and regular rhythm.     Heart sounds:  Normal heart sounds. No murmur heard.     Comments: Sternotomy scar with well-formed scab Pulmonary:     Effort: Pulmonary effort is normal.     Breath sounds: Examination of the right-lower field reveals decreased breath sounds. Decreased breath sounds present. No wheezing or rales.  Musculoskeletal:     Right lower leg: No edema.     Left lower leg: No edema.         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 Repeat CT scan in 6 months, as per Dr. Rodena Goldmann recommendations. 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 Recommend screening biological children with echocardiogram.  Also recommend genetic counseling and testing.  Seen by Dr. Broadus John, awaiting genetic work-up. Imperative to control heart  rate and blood pressure.   Change metoprolol tartrate 25 mg twice daily, to metoprolol succinate 100 mg daily.   Increase losartan to 50 mg daily Patient will check BMP with PCP in 1-2 weeks.   Continue amlodipine 5 mg daily.    Paroxysmal A. Fib: CHA2DS2VASc score 2, annual stroke risk 2% While patient was not initially on Eliquis, he is now on Eliquis for DVT anyway. Will reconsider discussion regarding anticoagulation after 3-74-monthtreatment for peroneal DVT.  DVT: Right gastrocnemius DVT, likely related to immobility post surgery. As such, we will treat this as provoked DVT.  Anticoagulation recommended for at least 3 months.    F/u in 2 months   MNigel Mormon MD Pager: [phone]: [phone]

## 2020-09-22 ENCOUNTER — Other Ambulatory Visit: Payer: Self-pay

## 2020-09-22 DIAGNOSIS — Z9889 Other specified postprocedural states: Secondary | ICD-10-CM

## 2020-09-22 DIAGNOSIS — I10 Essential (primary) hypertension: Secondary | ICD-10-CM

## 2020-09-25 ENCOUNTER — Encounter: Payer: Self-pay | Admitting: Cardiothoracic Surgery

## 2020-09-25 ENCOUNTER — Ambulatory Visit (INDEPENDENT_AMBULATORY_CARE_PROVIDER_SITE_OTHER): Payer: Self-pay | Admitting: Cardiothoracic Surgery

## 2020-09-25 ENCOUNTER — Ambulatory Visit
Admission: RE | Admit: 2020-09-25 | Discharge: 2020-09-25 | Disposition: A | Discharge status: Home / Self Care | Payer: Medicare Other | Source: Ambulatory Visit | Attending: Cardiothoracic Surgery | Admitting: Cardiothoracic Surgery

## 2020-09-25 ENCOUNTER — Other Ambulatory Visit: Payer: Self-pay

## 2020-09-25 VITALS — BP 154/103 | HR 77 | Resp 20 | Ht 70.0 in

## 2020-09-25 DIAGNOSIS — Z5189 Encounter for other specified aftercare: Secondary | ICD-10-CM

## 2020-09-25 NOTE — Progress Notes (Signed)
70 year old man is status post repair of type a dissection.  This was complicated by a small superficial stitch abscess which was ultimately treated with operative debridement and wound VAC placement.  This is now healed and he presents for wound check.  He denies fevers or chills or wound drainage Physical exam: BP (!) 154/103 (BP Location: Left Arm, Patient Position: Sitting)   Pulse 77   Resp 20   Ht 5\' 10"  (1.778 m)   SpO2 99% Comment: RA  BMI 24.11 kg/m    Well-appearing no acute distress The midportion of the sternal incision has scabbed over and is very shallow; there is no surrounding erythema Heart regular rate and rhythm  Imaging: I have personally reviewed his chest x-ray from today which demonstrates clear lung fields and a stable mediastinal silhouette  Impression/plan: Doing well after repair of type a dissection and treatment of stitch abscess. Follow-up in 3 months with CT angio chest abdomen pelvis to follow-up false lumen/residual dissection Control blood pressure in the meantime as he is doing  Broadus Z. Orvan Seen, [place]

## 2020-09-28 ENCOUNTER — Ambulatory Visit: Payer: Medicare Other | Admitting: Genetic Counselor

## 2020-09-28 ENCOUNTER — Other Ambulatory Visit: Payer: Self-pay

## 2020-10-04 ENCOUNTER — Telehealth: Payer: Self-pay

## 2020-10-04 DIAGNOSIS — I10 Essential (primary) hypertension: Secondary | ICD-10-CM

## 2020-10-04 MED ORDER — AMLODIPINE BESYLATE 10 MG PO TABS: 10.0000 mg | ORAL_TABLET | Freq: Every day | ORAL | 2 refills | Status: DC

## 2020-10-04 NOTE — Telephone Encounter (Signed)
Telephone encounter:  Reason for call: Patient called and said his BP remains high 140/102 BP HR 77  147/101 HR 71 133/93 HR  72 He also complained of numbness and a tingling in his forearm and fingers  Usual provider: MP  Last office visit: 09/21/20  Next office visit: 11/27/20   Last hospitalization: 09/01/20   Current Outpatient Medications on File Prior to Visit  Medication Sig Dispense Refill  . amLODipine (NORVASC) 5 MG tablet Take 1 tablet (5 mg total) by mouth daily. 90 tablet 3  . apixaban (ELIQUIS) 5 MG TABS tablet Take 1 tablet (5 mg total) by mouth 2 (two) times daily. Take 2 tablets (10mg ) twice daily for 7 days, then 1 tablet (5mg ) twice daily 120 tablet 1  . atorvastatin (LIPITOR) 10 MG tablet Take 1 tablet (10 mg total) by mouth daily. 90 tablet 3  . ferrous sulfate 325 (65 FE) MG tablet Take 1 tablet (325 mg total) by mouth daily with breakfast. 100 tablet 1  . hydrochlorothiazide (HYDRODIURIL) 25 MG tablet Take 1 tablet by mouth daily.    Marland Kitchen losartan (COZAAR) 25 MG tablet Take 1 tablet (25 mg total) by mouth daily. 90 tablet 1  . metoprolol succinate (TOPROL-XL) 100 MG 24 hr tablet Take 1 tablet (100 mg total) by mouth daily. Take with or immediately following a meal. 90 tablet 3  . Multiple Vitamin (MULTIVITAMIN) tablet Take 1 tablet by mouth daily. Alive    . oxcarbazepine (TRILEPTAL) 600 MG tablet Take 600 mg by mouth 2 (two) times daily.    . tamsulosin (FLOMAX) 0.4 MG CAPS capsule Take 1 capsule (0.4 mg total) by mouth daily. 30 capsule 3   No current facility-administered medications on file prior to visit.

## 2020-10-04 NOTE — Telephone Encounter (Signed)
Recommend increasing amlodipine to 10 mg daily. Is the tingling numbness constant? Which arm is it in? May need to check with PCP and consider Neurology referral.  Thanks MJP

## 2020-10-09 NOTE — Progress Notes (Signed)
Post-test Genetic Consultation notes  Juan Wagner is here today with his wife and daughter for his post-test genetic consult. We reviewed his pedigree and he reports no changes to his medical history or that of his family members. Corrections in his maternal uncles are noted in the pedigree below.  I informed Juan Wagner that he does not have a pathogenic variant for the aortopathies that include vascular EDS, Loeys-Dietz syndrome and FTAAD.  I explained to him that this indicates he does not have a mutation in the major genes implicated in aortopathies. 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 He was relieved to hear this.    Juan Wagner, Ph.D, [facility] Clinical Molecular Geneticist

## 2020-10-10 ENCOUNTER — Other Ambulatory Visit: Payer: Self-pay | Admitting: Cardiology

## 2020-10-10 DIAGNOSIS — Z9889 Other specified postprocedural states: Secondary | ICD-10-CM

## 2020-10-14 LAB — BASIC METABOLIC PANEL
BUN/Creatinine Ratio: 14 (ref 10–24)
BUN: 16 mg/dL (ref 8–27)
CO2: 24 mmol/L (ref 20–29)
Calcium: 9.6 mg/dL (ref 8.6–10.2)
Chloride: 100 mmol/L (ref 96–106)
Creatinine, Ser: 1.14 mg/dL (ref 0.76–1.27)
Glucose: 83 mg/dL (ref 65–99)
Potassium: 5 mmol/L (ref 3.5–5.2)
Sodium: 140 mmol/L (ref 134–144)
eGFR: 69 mL/min/{1.73_m2} (ref >59)

## 2020-10-14 LAB — LIPID PANEL
Chol/HDL Ratio: 2.1 ratio (ref 0.0–5.0)
Cholesterol, Total: 134 mg/dL (ref 100–199)
HDL: 65 mg/dL (ref >39)
LDL Chol Calc (NIH): 54 mg/dL (ref 0–99)
Triglycerides: 79 mg/dL (ref 0–149)
VLDL Cholesterol Cal: 15 mg/dL (ref 5–40)

## 2020-10-14 LAB — BRAIN NATRIURETIC PEPTIDE: BNP: 99.7 pg/mL (ref 0.0–100.0)

## 2020-10-25 ENCOUNTER — Other Ambulatory Visit: Payer: Self-pay | Admitting: Cardiothoracic Surgery

## 2020-10-25 DIAGNOSIS — I7101 Dissection of thoracic aorta: Secondary | ICD-10-CM

## 2020-10-25 DIAGNOSIS — I71019 Dissection of thoracic aorta, unspecified: Secondary | ICD-10-CM

## 2020-10-28 ENCOUNTER — Other Ambulatory Visit: Payer: Self-pay | Admitting: Family Medicine

## 2020-11-20 LAB — GENESEQ: FAMILIAL AORTOPATHY

## 2020-11-26 DIAGNOSIS — I711 Thoracic aortic aneurysm, ruptured, unspecified: Secondary | ICD-10-CM | POA: Insufficient documentation

## 2020-11-26 DIAGNOSIS — Z9889 Other specified postprocedural states: Secondary | ICD-10-CM | POA: Insufficient documentation

## 2020-11-26 NOTE — Progress Notes (Signed)
Patient referred by Tonia Ghent, MD for aortic dissection  Subjective:   Juan Wagner, male    DOB: Aug 11, 1950, 70 y.o.   MRN: [id number]   Chief Complaint  Patient presents with  . Hypertension  . 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  He was taking a recruitment class and was doing active exercises such as the BX and push-ups, when he had sudden onset chest pain and near syncope.  He was rushed to emergency room at [facility] where he was found to have aortic dissection on work-up, details below.  He underwent emergent repair of ascending aorta aneurysm by Dr. Orvan Seen.  Postoperative stay was largely uneventful other than brief postop A. Fib.  Patient was admitted in 08/2020 with nonhealing sternal wound.  He underwent surgical debridement by Dr. Orvan Seen.  He was discharged with wound VAC, which is now removed.  His blood pressure remains elevated.  He admits to eating high salt diet at times.  Patient underwent genetic testing given history of thoracic aorta aneurysm rupture.  No variant associated with familial aortopathy was found.  Blood pressure is better controlled.  He denies any chest pain, shortness of breath, leg edema, orthopnea, PND symptoms.  He has an upcoming CT scan and follow-up with Dr. Orvan Seen later this month.   Current Outpatient Medications on File Prior to Visit  Medication Sig Dispense Refill  . amLODipine (NORVASC) 10 MG tablet Take 1 tablet (10 mg total) by mouth daily. 30 tablet 2  . apixaban (ELIQUIS) 5 MG TABS tablet Take 1 tablet (5 mg total) by mouth 2 (two) times daily. Take 2 tablets (12m) twice daily for 7 days, then 1 tablet (576m twice daily 120 tablet 1  . atorvastatin (LIPITOR)  10 MG tablet Take 1 tablet (10 mg total) by mouth daily. 90 tablet 3  . ferrous sulfate 325 (65 FE) MG tablet Take 1 tablet (325 mg total) by mouth daily with breakfast. 100 tablet 1  . hydrochlorothiazide (HYDRODIURIL) 25 MG tablet Take 1 tablet by mouth once as needed.    . Marland Kitchenosartan (COZAAR) 25 MG tablet TAKE 1 TABLET BY MOUTH DAILY 90 tablet 1  . metoprolol succinate (TOPROL-XL) 100 MG 24 hr tablet Take 1 tablet (100 mg total) by mouth daily. Take with or immediately following a meal. 90 tablet 3  . Multiple Vitamin (MULTIVITAMIN) tablet Take 1 tablet by mouth daily. Alive    . oxcarbazepine (TRILEPTAL) 600 MG tablet Take 600 mg by mouth 2 (two) times daily.    . tamsulosin (FLOMAX) 0.4 MG CAPS capsule TAKE 1 CAPSULE BY MOUTH EVERY DAY 90 capsule 1   No current facility-administered medications on file prior to visit.    Cardiovascular and other pertinent studies:  EKG 11/27/2020: Sinus rhythm 65 bpm First degree A-V block  Left anterior fascicular block Left ventricular hypertrophy T wave inversions inferolateral leads, likely related to LVH No significant change compared to previous EKK  Vascular [place]22/[place]: RIGHT:  - Findings consistent with acute deep vein thrombosis involving the right gastrocnemius veins.  - There is no evidence of superficial venous thrombosis.     Op note 05/29/2020 ([place]  HEMASHIELD PLATINUM WOVEN DOUBLE VELOUR VASCULAR GRAFT 32 MM X 30 CM (N/A) TRANSESOPHAGEAL ECHOCARDIOGRAM (TEE) (N/A)  Echocardiogram 05/29/2020:   Mod LVH> EF 55-60% Aortic root 4.1 cm. Ascending aorta aneurysm 4.8-5.1 cm. There is a aortic dissection flap that is evident just above the aortic valve cusp, does not involve aortic root.  There appears to be intramural hematoma involving the ascending aorta.  CT Chest 05/29/2020: Changes consistent with a type a dissection of the aorta involving the anterior and  descending portions with extension into the right innominate artery and into the origin of the left subclavian artery. No definitive involvement of the common carotid artery on the left is noted. The extent of the dissection flap distally is somewhat difficult to evaluate although felt to extend into the midportion of the descending thoracic aorta. Arterial timed CTA of the chest may be helpful as clinically necessary.  Adjacent to the ascending aorta just above the aortic root is a periaortic hematoma which is better visualized on the regional images of the abdomen and pelvis film. This measures approximately 3.7 x 2.4 cm in greatest dimension but again is somewhat limited in evaluation due to the timing of the contrast bolus and lack of delayed images in the chest.  Central perivascular edema is noted within the lungs bilaterally likely related to central compression of the vasculature related to aortic changes.  No evidence of pulmonary emboli although there is significant attenuation of the lumen of the pulmonary artery related to the periaortic hematoma.  Aortic aneurysm NOS (ICD10-I71.9).  Critical Value/emergent results were called by telephone at the time of interpretation on 05/29/2020 at 11:44 am to Dr. Roosevelt Locks, who verbally acknowledged these results.    Recent labs: 10/13/2020: Glucose 83, BUN/Cr 16/1.14. EGFR 69. Na/K 140/5.0.  BNP 99.7 normal Chol 134, TG 79, HDL 65, LDL 54  09/07/2020: Glucose 97, BUN/Cr 14/1.12. EGFR >60. Na/K 136/3.8.  Albumin 3.2.  Rest of the CMP normal H/H 10.7/33.0. MCV 81. Platelets 277  06/13/2020: Glucose 128, BUN/Cr 14/1.23. EGFR >60. Na/K 134/5.1. Rest of the CMP normal H/H 9.4/28.5. MCV 89.3. Platelets 728 HbA1C, lipids N/A   Review of Systems  Cardiovascular: Negative for chest pain, dyspnea on exertion, leg swelling, palpitations and syncope.         Vitals:   11/27/20 0935  BP: 138/86  Pulse: 64  Resp:  17  Temp: (!) 97.3 F (36.3 C)  SpO2: 97%     Body mass index is 24.22 kg/m. Filed Weights   11/27/20 0935  Weight: 168 lb 12.8 oz (76.6 kg)     Objective:   Physical Exam Vitals and nursing note reviewed.  Constitutional:      General: He is not in acute distress. Neck:     Vascular: No JVD.  Cardiovascular:     Rate and Rhythm: Normal rate and regular rhythm.     Heart sounds: Normal heart sounds. No murmur heard.     Comments: Sternotomy scar with well-formed scab Pulmonary:     Effort: Pulmonary effort is normal.     Breath sounds: Examination of the right-lower field reveals decreased breath sounds. Decreased breath sounds present. No wheezing or rales.  Musculoskeletal:     Right lower leg: No edema.     Left lower leg: No edema.         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 Orvan Seen with  Hemashield platinum double velous vascular grafr 32/X30 mm, post-op Afib, provoked DVT due to decreased mobility  Thoracic aorta aneurysm: Now s/p repair after aortic dissection 05/2020. Repeat CT scan in in 11/2020, as per Dr. Rodena Goldmann recommendations. Genetic testing yielded no variant associated with familial aortopathy. Continue blood pressure and heart rate control.  Hypertension: Fairly well-controlled on metoprolol succinate 50 mg daily, losartan 25 mg daily, amlodipine 5 mg daily.   BMP within acceptable limits with potassium at 5.0.  Murmur: Aortic systolic murmur on exam.  Will obtain echocardiogram.  Paroxysmal A. Fib: CHA2DS2VASc score 2, annual stroke risk 2% While patient was not initially on Eliquis, he is now on Eliquis for DVT anyway. Given that his stroke risk will only increase with increasing age, will continue Eliquis 5 mg daily.  DVT: H/o right gastrocnemius DVT, likely related to immobility post surgery. Remains on Eliquis given  paroxysmal A. Fib.  F/u in 3 months   Nigel Mormon, MD Pager: [phone] Office: [phone]

## 2020-11-27 ENCOUNTER — Other Ambulatory Visit: Payer: Self-pay

## 2020-11-27 ENCOUNTER — Encounter: Payer: Self-pay | Admitting: Cardiology

## 2020-11-27 ENCOUNTER — Ambulatory Visit: Payer: Medicare Other | Admitting: Cardiology

## 2020-11-27 VITALS — BP 138/86 | HR 64 | Temp 97.3°F | Resp 17 | Ht 70.0 in | Wt 168.8 lb

## 2020-11-27 DIAGNOSIS — I711 Thoracic aortic aneurysm, ruptured, unspecified: Secondary | ICD-10-CM

## 2020-11-27 DIAGNOSIS — R011 Cardiac murmur, unspecified: Secondary | ICD-10-CM

## 2020-11-27 DIAGNOSIS — Z9889 Other specified postprocedural states: Secondary | ICD-10-CM

## 2020-11-27 DIAGNOSIS — I10 Essential (primary) hypertension: Secondary | ICD-10-CM

## 2020-11-27 DIAGNOSIS — I82509 Chronic embolism and thrombosis of unspecified deep veins of unspecified lower extremity: Secondary | ICD-10-CM

## 2020-12-14 ENCOUNTER — Other Ambulatory Visit: Payer: Self-pay | Admitting: Cardiothoracic Surgery

## 2020-12-14 ENCOUNTER — Ambulatory Visit
Admission: RE | Admit: 2020-12-14 | Discharge: 2020-12-14 | Disposition: A | Discharge status: Home / Self Care | Payer: Medicare Other | Source: Ambulatory Visit | Attending: Cardiothoracic Surgery | Admitting: Cardiothoracic Surgery

## 2020-12-14 ENCOUNTER — Ambulatory Visit: Payer: Medicare Other | Admitting: Cardiothoracic Surgery

## 2020-12-14 DIAGNOSIS — I7101 Dissection of thoracic aorta: Secondary | ICD-10-CM

## 2020-12-14 DIAGNOSIS — I71019 Dissection of thoracic aorta, unspecified: Secondary | ICD-10-CM

## 2020-12-14 MED ORDER — IOPAMIDOL (ISOVUE-370) INJECTION 76%
75.0000 mL | Freq: Once | INTRAVENOUS | Status: AC | PRN
  Administered 2020-12-14: 75 mL via INTRAVENOUS

## 2020-12-28 ENCOUNTER — Other Ambulatory Visit: Payer: Self-pay

## 2020-12-28 ENCOUNTER — Ambulatory Visit: Payer: Medicare Other | Admitting: Cardiothoracic Surgery

## 2020-12-28 ENCOUNTER — Encounter: Payer: Self-pay | Admitting: Cardiothoracic Surgery

## 2020-12-28 VITALS — BP 144/91 | HR 72 | Resp 20 | Ht 70.0 in

## 2020-12-28 DIAGNOSIS — I7101 Dissection of thoracic aorta: Secondary | ICD-10-CM

## 2020-12-28 DIAGNOSIS — I71019 Dissection of thoracic aorta, unspecified: Secondary | ICD-10-CM

## 2021-02-11 ENCOUNTER — Other Ambulatory Visit: Payer: Self-pay | Admitting: Family Medicine

## 2021-02-11 DIAGNOSIS — I10 Essential (primary) hypertension: Secondary | ICD-10-CM

## 2021-02-11 DIAGNOSIS — D649 Anemia, unspecified: Secondary | ICD-10-CM

## 2021-02-11 DIAGNOSIS — Z125 Encounter for screening for malignant neoplasm of prostate: Secondary | ICD-10-CM

## 2021-02-12 ENCOUNTER — Ambulatory Visit: Payer: Medicare Other

## 2021-02-12 ENCOUNTER — Other Ambulatory Visit: Payer: Self-pay

## 2021-02-12 DIAGNOSIS — R011 Cardiac murmur, unspecified: Secondary | ICD-10-CM

## 2021-02-13 ENCOUNTER — Other Ambulatory Visit: Payer: Self-pay | Admitting: Family Medicine

## 2021-02-13 DIAGNOSIS — D649 Anemia, unspecified: Secondary | ICD-10-CM

## 2021-02-13 NOTE — Telephone Encounter (Signed)
Refill request for Ferrous sulfate 325 mg tablets  LOV - 07/11/20 Next OV - 02/19/21 and will have labs done tomorrow Last refill - 07/31/20 #100/1

## 2021-02-14 ENCOUNTER — Other Ambulatory Visit: Payer: Medicare Other

## 2021-02-14 ENCOUNTER — Other Ambulatory Visit (INDEPENDENT_AMBULATORY_CARE_PROVIDER_SITE_OTHER): Payer: Medicare Other

## 2021-02-14 ENCOUNTER — Other Ambulatory Visit: Payer: Self-pay

## 2021-02-14 DIAGNOSIS — I10 Essential (primary) hypertension: Secondary | ICD-10-CM

## 2021-02-14 DIAGNOSIS — Z125 Encounter for screening for malignant neoplasm of prostate: Secondary | ICD-10-CM | POA: Diagnosis not present

## 2021-02-14 DIAGNOSIS — D649 Anemia, unspecified: Secondary | ICD-10-CM | POA: Diagnosis not present

## 2021-02-14 LAB — CBC WITH DIFFERENTIAL/PLATELET
Basophils Absolute: 0 10*3/uL (ref 0.0–0.1)
Basophils Relative: 0.7 % (ref 0.0–3.0)
Eosinophils Absolute: 0.1 10*3/uL (ref 0.0–0.7)
Eosinophils Relative: 1.1 % (ref 0.0–5.0)
HCT: 35.6 % — ABNORMAL LOW (ref 39.0–52.0)
Hemoglobin: 11.4 g/dL — ABNORMAL LOW (ref 13.0–17.0)
Lymphocytes Relative: 20.2 % (ref 12.0–46.0)
Lymphs Abs: 1 10*3/uL (ref 0.7–4.0)
MCHC: 32.1 g/dL (ref 30.0–36.0)
MCV: 86.9 fl (ref 78.0–100.0)
Monocytes Absolute: 0.7 10*3/uL (ref 0.1–1.0)
Monocytes Relative: 15 % — ABNORMAL HIGH (ref 3.0–12.0)
Neutro Abs: 3.1 10*3/uL (ref 1.4–7.7)
Neutrophils Relative %: 63 % (ref 43.0–77.0)
Platelets: 236 10*3/uL (ref 150.0–400.0)
RBC: 4.1 Mil/uL — ABNORMAL LOW (ref 4.22–5.81)
RDW: 15.1 % (ref 11.5–15.5)
WBC: 4.9 10*3/uL (ref 4.0–10.5)

## 2021-02-14 LAB — COMPREHENSIVE METABOLIC PANEL
ALT: 18 U/L (ref 0–53)
AST: 24 U/L (ref 0–37)
Albumin: 3.8 g/dL (ref 3.5–5.2)
Alkaline Phosphatase: 77 U/L (ref 39–117)
BUN: 20 mg/dL (ref 6–23)
CO2: 25 mEq/L (ref 19–32)
Calcium: 9.4 mg/dL (ref 8.4–10.5)
Chloride: 100 mEq/L (ref 96–112)
Creatinine, Ser: 1.37 mg/dL (ref 0.40–1.50)
GFR: 51.91 mL/min — ABNORMAL LOW (ref >60.00)
Glucose, Bld: 96 mg/dL (ref 70–99)
Potassium: 4 mEq/L (ref 3.5–5.1)
Sodium: 135 mEq/L (ref 135–145)
Total Bilirubin: 0.4 mg/dL (ref 0.2–1.2)
Total Protein: 7.5 g/dL (ref 6.0–8.3)

## 2021-02-14 LAB — PSA, MEDICARE: PSA: 2.33 ng/ml (ref 0.10–4.00)

## 2021-02-14 LAB — IRON: Iron: 84 ug/dL (ref 42–165)

## 2021-02-14 NOTE — Telephone Encounter (Signed)
Sent. Thanks.   

## 2021-02-15 ENCOUNTER — Ambulatory Visit (INDEPENDENT_AMBULATORY_CARE_PROVIDER_SITE_OTHER): Payer: Medicare Other

## 2021-02-15 DIAGNOSIS — Z0000 Encounter for general adult medical examination without abnormal findings: Secondary | ICD-10-CM | POA: Diagnosis not present

## 2021-02-15 NOTE — Progress Notes (Signed)
Subjective:   Juan Wagner is a 70 y.o. male who presents for Medicare Annual/Subsequent preventive examination.  Review of Systems: N/A      I connected with the patient today by telephone and verified that I am speaking with the correct person using two identifiers. Location patient: home Location nurse: work Persons participating in the telephone visit: patient, nurse.   I discussed the limitations, risks, security and privacy concerns of performing an evaluation and management service by telephone and the availability of in person appointments. I also discussed with the patient that there may be a patient responsible charge related to this service. The patient expressed understanding and verbally consented to this telephonic visit.        Cardiac Risk Factors include: advanced age (>47mn, >>46women);Other (see comment), Risk factor comments: hyperlipidemia     Objective:    Today's Vitals   There is no height or weight on file to calculate BMI.  Advanced Directives 02/15/2021 09/01/2020 05/29/2020 02/15/2020 02/10/2018 04/15/2017 02/03/2017  Does Patient Have a Medical Advance Directive? Yes Yes No Yes Yes Yes Yes  Type of AParamedicof ABrierLiving will HPort CostaLiving will - HAllynLiving will HBaldwin ParkLiving will Living will;Healthcare Power of [place]oning[place] will  Copy of HViolain Chart? No - copy requested No - copy requested - No - copy requested No - copy requested - No - copy requested  Would patient like information on creating a medical advance directive? - - No - Guardian declined - - - -    Current Medications (verified) Outpatient Encounter Medications as of 02/15/2021  Medication Sig   amLODipine (NORVASC) 10 MG tablet Take 1 tablet (10 mg total) by mouth daily.   apixaban (ELIQUIS) 5 MG TABS tablet Take 1 tablet (5 mg total)  by mouth 2 (two) times daily. Take 2 tablets ('10mg'$ ) twice daily for 7 days, then 1 tablet ('5mg'$ ) twice daily   atorvastatin (LIPITOR) 10 MG tablet Take 1 tablet (10 mg total) by mouth daily.   ferrous sulfate 325 (65 FE) MG tablet TAKE 1 TABLET BY MOUTH EVERY DAY WITH BREAKFAST   hydrochlorothiazide (HYDRODIURIL) 25 MG tablet Take 1 tablet by mouth once as needed.   losartan (COZAAR) 25 MG tablet TAKE 1 TABLET BY MOUTH DAILY   Multiple Vitamin (MULTIVITAMIN) tablet Take 1 tablet by mouth daily. Alive   oxcarbazepine (TRILEPTAL) 600 MG tablet Take 600 mg by mouth 2 (two) times daily.   tamsulosin (FLOMAX) 0.4 MG CAPS capsule TAKE 1 CAPSULE BY MOUTH EVERY DAY   metoprolol succinate (TOPROL-XL) 100 MG 24 hr tablet Take 1 tablet (100 mg total) by mouth daily. Take with or immediately following a meal.   No facility-administered encounter medications on file as of 02/15/2021.    Allergies (verified) Lisinopril, Prevnar [pneumococcal 13-val conj vacc], Quinolones, and Viagra [sildenafil citrate]   History: Past Medical History:  Diagnosis Date   Aortic dissection, thoracic ([place] 05/29/2020   DVT (deep venous thrombosis) ([place] 06/12/2020   RLE DVT   ED (erectile dysfunction)    Hyperlipidemia    2018 was 184- past hx    Hypertension    Seizures (HEbro    1st event 03/2011- no seizure in years as of 2019   Sleep apnea    no CPAP use as of 2019   Past Surgical History:  Procedure Laterality Date   APPLICATION OF WOUND VAC N/A  09/01/2020   Procedure: possible APPLICATION OF WOUND VAC;  Surgeon: Wonda Olds, MD;  Location: [facility] OR;  Service: Vascular;  Laterality: N/A;   COLONOSCOPY     KNEE SURGERY     scope L knee   POLYPECTOMY     REPAIR OF ACUTE ASCENDING THORACIC AORTIC DISSECTION N/A 05/29/2020   Procedure: REPAIR OF ACUTE ASCENDING THORACIC AORTIC DISSECTION USING HEMASHIELD PLATINUM WOVEN DOUBLE VELOUR VASCULAR GRAFT 32 MM X 30 CM;  Surgeon: Wonda Olds, MD;  [facility];   Service: Vascular;  Laterality: N/A;   SEPTOPLASTY     broken nose   TEE WITHOUT CARDIOVERSION N/A 05/29/2020   Procedure: TRANSESOPHAGEAL ECHOCARDIOGRAM (TEE);  Surgeon: Wonda Olds, MD;  Location: [place];  Service: Open Heart Surgery;  Laterality: N/A;   WOUND DEBRIDEMENT N/A 09/01/2020   Procedure: WOUND DEBRIDEMENT OF LOWER PART OF STERNUM;  Surgeon: Wonda Olds, MD;  Location: [facility] OR;  Service: Vascular;  Laterality: N/A;   Family History  Problem Relation Age of Onset   Hypertension Mother    Cancer Mother        ovarian CA chemo 2010 remission   Breast cancer Mother    Ovarian cancer Mother    Alcohol abuse Father    Cancer Father    Throat cancer Father    Esophageal cancer Father    Cancer Sister        pancreatic CA Stage IV,mets to lungs   Pancreatic cancer Sister    Cirrhosis Brother        smoke and drugs   Cancer Brother        lung cancer   Lung cancer Brother    Hypertension Maternal Grandmother    Hypertension Maternal Grandfather    Prostate cancer Neg Hx    Colon cancer Neg Hx    Colon polyps Neg Hx    Rectal cancer Neg Hx    Stomach cancer Neg Hx    Social History   Socioeconomic History   Marital status: Married    Spouse name: Not on file   Number of children: Not on file   Years of education: Not on file   Highest education level: Not on file  Occupational History   Not on file  Tobacco Use   Smoking status: Never   Smokeless tobacco: Never  Vaping Use   Vaping Use: Never used  Substance and Sexual Activity   Alcohol use: No    Alcohol/week: 0.0 standard drinks   Drug use: No   Sexual activity: Yes  Other Topics Concern   Not on file  Social History Narrative   Married ~40 years   2 kids   Retired from [employer] in [place], works with recruits as of 2021   Social Determinants of Radio broadcast assistant Strain: Low Risk    Difficulty of Paying Living Expenses: Not hard at all  Food Insecurity: No Food  Insecurity   Worried About Charity fundraiser in the Last Year: Never true   Arboriculturist in the Last Year: Never true  Transportation Needs: No Transportation Needs   Lack of Transportation (Medical): No   Lack of Transportation (Non-Medical): No  Physical Activity: Sufficiently Active   Days of Exercise per Week: 3 days   Minutes of Exercise per Session: 60 min  Stress: No Stress Concern Present   Feeling of Stress : Not at all  Social Connections: Not on file  Tobacco Counseling Counseling given: Not Answered   Clinical Intake:  Pre-visit preparation completed: Yes  Pain : No/denies pain     Nutritional Risks: None Diabetes: No  How often do you need to have someone help you when you read instructions, pamphlets, or other written materials from your doctor or pharmacy?: 1 - Never  Diabetic: No Nutrition Risk Assessment:  Has the patient had any N/V/D within the last 2 months?  No  Does the patient have any non-healing wounds?  No  Has the patient had any unintentional weight loss or weight gain?  No   Diabetes:  Is the patient diabetic?  No  If diabetic, was a CBG obtained today?   N/A Did the patient bring in their glucometer from home?   N/A How often do you monitor your CBG's? N/A.   Financial Strains and Diabetes Management:  Are you having any financial strains with the device, your supplies or your medication?  N/A .  Does the patient want to be seen by Chronic Care Management for management of their diabetes?   N/A Would the patient like to be referred to a Nutritionist or for Diabetic Management?   N/A Interpreter Needed?: No  Information entered by :: CJohnson, RN   Activities of Daily Living In your present state of health, do you have any difficulty performing the following activities: 02/15/2021 09/01/2020  Hearing? N N  Vision? N N  Difficulty concentrating or making decisions? N N  Walking or climbing stairs? N N  Dressing or bathing?  N N  Doing errands, shopping? N -  Preparing Food and eating ? N -  Using the Toilet? N -  In the past six months, have you accidently leaked urine? N -  Do you have problems with loss of bowel control? N -  Managing your Medications? N -  Managing your Finances? N -  Housekeeping or managing your Housekeeping? N -  Some recent data might be hidden    Patient Care Team: Tonia Ghent, MD as PCP - General (Family Medicine) Anabel Bene, MD as Referring Physician (Neurology)  Indicate any recent Medical Services you may have received from other than [facility] providers in the past year (date may be approximate).     Assessment:   This is a routine wellness examination for [place].  Hearing/Vision screen Vision Screening - Comments:: Patient gets annual eye exams.  Dietary issues and exercise activities discussed: Current Exercise Habits: Home exercise routine, Type of exercise: Other - see comments (rides his bike), Time (Minutes): 60, Frequency (Times/Week): 3, Weekly Exercise (Minutes/Week): 180, Exercise limited by: None identified   Goals Addressed             This Visit's Progress    Patient Stated       02/15/2021, I will continue to ride my bike 3 days a week for 20 miles.       Depression Screen PHQ 2/9 Scores 02/15/2021 06/13/2020 02/15/2020 02/12/2019 02/10/2018 02/03/2017 01/30/2016  PHQ - 2 Score 0 0 0 0 0 0 0  PHQ- 9 Score 0 - 0 - 0 - -    Fall Risk Fall Risk  02/15/2021 07/11/2020 06/13/2020 02/15/2020 02/12/2019  Falls in the past year? 0 0 0 0 0  Number falls in past yr: 0 0 0 0 -  Injury with Fall? 0 - 0 0 -  Risk for fall due to : No Fall Risks - - Medication side effect -  Follow up  Falls evaluation completed;Falls prevention discussed Falls evaluation completed Falls evaluation completed Falls evaluation completed;Falls prevention discussed -    FALL RISK PREVENTION PERTAINING TO THE HOME:  Any stairs in or around the home? Yes  If so, are there any  without handrails? No  Home free of loose throw rugs in walkways, pet beds, electrical cords, etc? Yes  Adequate lighting in your home to reduce risk of falls? Yes   ASSISTIVE DEVICES UTILIZED TO PREVENT FALLS:  Life alert? No  Use of a cane, Finks or w/c? No  Grab bars in the bathroom? No  Shower chair or bench in shower? No  Elevated toilet seat or a handicapped toilet? No   TIMED UP AND GO:  Was the test performed?  N/A telephone visit .    Cognitive Function: MMSE - Mini Mental State Exam 02/15/2021 02/15/2020 02/10/2018 02/03/2017  Orientation to time '5 5 5 5  '$ Orientation to Place '5 5 5 5  '$ Registration '3 3 3 3  '$ Attention/ Calculation 5 5 0 0  Recall '3 2 3 3  '$ Language- name 2 objects - - 0 0  Language- repeat '1 1 1 1  '$ Language- follow 3 step command - - 3 3  Language- read & follow direction - - 0 0  Write a sentence - - 0 0  Copy design - - 0 0  Total score - - 20 20  Mini Cog  Mini-Cog screen was completed. Maximum score is 22. A value of 0 denotes this part of the MMSE was not completed or the patient failed this part of the Mini-Cog screening.       Immunizations Immunization History  Administered Date(s) Administered   PFIZER(Purple Top)SARS-COV-2 Vaccination 08/23/2019, 09/13/2019, 05/13/2020   Pneumococcal Conjugate-13 12/08/2014   Td 12/21/2008, 04/17/2016    TDAP status: Up to date  Flu Vaccine status: due Fall 2022  Pneumococcal vaccine status: Allergic  Covid-19 vaccine status: Completed 3 vaccines  Qualifies for Shingles Vaccine? Yes   Zostavax completed No   Shingrix Completed?: No.    Education has been provided regarding the importance of this vaccine. Patient has been advised to call insurance company to determine out of pocket expense if they have not yet received this vaccine. Advised may also receive vaccine at local pharmacy or Health Dept. Verbalized acceptance and understanding.  Screening Tests Health Maintenance  Topic Date Due    Zoster Vaccines- Shingrix (1 of 2) Never done   COVID-19 Vaccine (4 - Booster for Pfizer series) 09/13/2020   INFLUENZA VACCINE  02/19/2021   COLONOSCOPY (Pts 45-46yr Insurance coverage will need to be confirmed)  04/29/2022   TETANUS/TDAP  04/17/2026   Hepatitis C Screening  Completed   HPV VACCINES  Aged Out    Health Maintenance  Health Maintenance Due  Topic Date Due   Zoster Vaccines- Shingrix (1 of 2) Never done   COVID-19 Vaccine (4 - Booster for Pfizer series) 09/13/2020    Colorectal cancer screening: Type of screening: Colonoscopy. Completed 04/29/2017. Repeat every 5 years  Lung Cancer Screening: (Low Dose CT Chest recommended if Age 837-80years, 30 pack-year currently smoking OR have quit w/in 15 years.) does not qualify.    Additional Screening:  Hepatitis C Screening: does qualify; Completed 01/30/2016  Vision Screening: Recommended annual ophthalmology exams for early detection of glaucoma and other disorders of the eye. Is the patient up to date with their annual eye exam?  Yes  Who is the provider or what is the  name of the office in which the patient attends annual eye exams? [facility] If pt is not established with a provider, would they like to be referred to a provider to establish care? No .   Dental Screening: Recommended annual dental exams for proper oral hygiene  Community Resource Referral / Chronic Care Management: CRR required this visit?  No   CCM required this visit?  No      Plan:     I have personally reviewed and noted the following in the patient's chart:   Medical and social history Use of alcohol, tobacco or illicit drugs  Current medications and supplements including opioid prescriptions. Patient is not currently taking opioid prescriptions. Functional ability and status Nutritional status Physical activity Advanced directives List of other physicians Hospitalizations, surgeries, and ER visits in previous 12  months Vitals Screenings to include cognitive, depression, and falls Referrals and appointments  In addition, I have reviewed and discussed with patient certain preventive protocols, quality metrics, and best practice recommendations. A written personalized care plan for preventive services as well as general preventive health recommendations were provided to patient.   Due to this being a telephonic visit, the after visit summary with patients personalized plan was offered to patient via office or my-chart. Patient preferred to pick up at office at next visit or via mychart.   Andrez Grime, LPN   [id number]

## 2021-02-15 NOTE — Progress Notes (Signed)
PCP notes:  Health Maintenance: Shingrix- due    Abnormal Screenings: none   Patient concerns: Right arm and fingertips numb Numbness in toes on both feet   Nurse concerns: none   Next PCP appt.: 02/19/2021 @ 11 am

## 2021-02-15 NOTE — Patient Instructions (Signed)
Juan Wagner , Thank you for taking time to come for your Medicare Wellness Visit. I appreciate your ongoing commitment to your health goals. Please review the following plan we discussed and let me know if I can assist you in the future.   Screening recommendations/referrals: Colonoscopy: Up to date, completed 04/29/2017, due 04/2022 Recommended yearly ophthalmology/optometry visit for glaucoma screening and checkup Recommended yearly dental visit for hygiene and checkup  Vaccinations: Influenza vaccine: due Fall 2022  Pneumococcal vaccine: allergic Tdap vaccine: Up to date, completed 04/17/2016, due 03/2026 Shingles vaccine: due, check with your insurance regarding coverage if interested    Covid-19: completed 3 vaccines   Advanced directives: Please bring a copy of your POA (Power of Attorney) and/or Living Will to your next appointment.   Conditions/risks identified: hyperlipidemia   Next appointment: Follow up in one year for your annual wellness visit.   Preventive Care 71 Years and Older, Male Preventive care refers to lifestyle choices and visits with your health care provider that can promote health and wellness. What does preventive care include? A yearly physical exam. This is also called an annual well check. Dental exams once or twice a year. Routine eye exams. Ask your health care provider how often you should have your eyes checked. Personal lifestyle choices, including: Daily care of your teeth and gums. Regular physical activity. Eating a healthy diet. Avoiding tobacco and drug use. Limiting alcohol use. Practicing safe sex. Taking low doses of aspirin every day. Taking vitamin and mineral supplements as recommended by your health care provider. What happens during an annual well check? The services and screenings done by your health care provider during your annual well check will depend on your age, overall health, lifestyle risk factors, and family history of  disease. Counseling  Your health care provider may ask you questions about your: Alcohol use. Tobacco use. Drug use. Emotional well-being. Home and relationship well-being. Sexual activity. Eating habits. History of falls. Memory and ability to understand (cognition). Work and work Statistician. Screening  You may have the following tests or measurements: Height, weight, and BMI. Blood pressure. Lipid and cholesterol levels. These may be checked every 5 years, or more frequently if you are over 48 years old. Skin check. Lung cancer screening. You may have this screening every year starting at age 74 if you have a 30-pack-year history of smoking and currently smoke or have quit within the past 15 years. Fecal occult blood test (FOBT) of the stool. You may have this test every year starting at age 39. Flexible sigmoidoscopy or colonoscopy. You may have a sigmoidoscopy every 5 years or a colonoscopy every 10 years starting at age 68. Prostate cancer screening. Recommendations will vary depending on your family history and other risks. Hepatitis C blood test. Hepatitis B blood test. Sexually transmitted disease (STD) testing. Diabetes screening. This is done by checking your blood sugar (glucose) after you have not eaten for a while (fasting). You may have this done every 1-3 years. Abdominal aortic aneurysm (AAA) screening. You may need this if you are a current or former smoker. Osteoporosis. You may be screened starting at age 93 if you are at high risk. Talk with your health care provider about your test results, treatment options, and if necessary, the need for more tests. Vaccines  Your health care provider may recommend certain vaccines, such as: Influenza vaccine. This is recommended every year. Tetanus, diphtheria, and acellular pertussis (Tdap, Td) vaccine. You may need a Td booster every 10  years. Zoster vaccine. You may need this after age 4. Pneumococcal 13-valent  conjugate (PCV13) vaccine. One dose is recommended after age 64. Pneumococcal polysaccharide (PPSV23) vaccine. One dose is recommended after age 80. Talk to your health care provider about which screenings and vaccines you need and how often you need them. This information is not intended to replace advice given to you by your health care provider. Make sure you discuss any questions you have with your health care provider. Document Released: 08/04/2015 Document Revised: 03/27/2016 Document Reviewed: 05/09/2015 Elsevier Interactive Patient Education  2017 [place] Prevention in the Home Falls can cause injuries. They can happen to people of all ages. There are many things you can do to make your home safe and to help prevent falls. What can I do on the outside of my home? Regularly fix the edges of walkways and driveways and fix any cracks. Remove anything that might make you trip as you walk through a door, such as a raised step or threshold. Trim any bushes or trees on the path to your home. Use bright outdoor lighting. Clear any walking paths of anything that might make someone trip, such as rocks or tools. Regularly check to see if handrails are loose or broken. Make sure that both sides of any steps have handrails. Any raised decks and porches should have guardrails on the edges. Have any leaves, snow, or ice cleared regularly. Use sand or salt on walking paths during winter. Clean up any spills in your garage right away. This includes oil or grease spills. What can I do in the bathroom? Use night lights. Install grab bars by the toilet and in the tub and shower. Do not use towel bars as grab bars. Use non-skid mats or decals in the tub or shower. If you need to sit down in the shower, use a plastic, non-slip stool. Keep the floor dry. Clean up any water that spills on the floor as soon as it happens. Remove soap buildup in the tub or shower regularly. Attach bath mats  securely with double-sided non-slip rug tape. Do not have throw rugs and other things on the floor that can make you trip. What can I do in the bedroom? Use night lights. Make sure that you have a light by your bed that is easy to reach. Do not use any sheets or blankets that are too big for your bed. They should not hang down onto the floor. Have a firm chair that has side arms. You can use this for support while you get dressed. Do not have throw rugs and other things on the floor that can make you trip. What can I do in the kitchen? Clean up any spills right away. Avoid walking on wet floors. Keep items that you use a lot in easy-to-reach places. If you need to reach something above you, use a strong step stool that has a grab bar. Keep electrical cords out of the way. Do not use floor polish or wax that makes floors slippery. If you must use wax, use non-skid floor wax. Do not have throw rugs and other things on the floor that can make you trip. What can I do with my stairs? Do not leave any items on the stairs. Make sure that there are handrails on both sides of the stairs and use them. Fix handrails that are broken or loose. Make sure that handrails are as long as the stairways. Check any carpeting to make sure  that it is firmly attached to the stairs. Fix any carpet that is loose or worn. Avoid having throw rugs at the top or bottom of the stairs. If you do have throw rugs, attach them to the floor with carpet tape. Make sure that you have a light switch at the top of the stairs and the bottom of the stairs. If you do not have them, ask someone to add them for you. What else can I do to help prevent falls? Wear shoes that: Do not have high heels. Have rubber bottoms. Are comfortable and fit you well. Are closed at the toe. Do not wear sandals. If you use a stepladder: Make sure that it is fully opened. Do not climb a closed stepladder. Make sure that both sides of the stepladder  are locked into place. Ask someone to hold it for you, if possible. Clearly mark and make sure that you can see: Any grab bars or handrails. First and last steps. Where the edge of each step is. Use tools that help you move around (mobility aids) if they are needed. These include: Canes. Walkers. Scooters. Crutches. Turn on the lights when you go into a dark area. Replace any light bulbs as soon as they burn out. Set up your furniture so you have a clear path. Avoid moving your furniture around. If any of your floors are uneven, fix them. If there are any pets around you, be aware of where they are. Review your medicines with your doctor. Some medicines can make you feel dizzy. This can increase your chance of falling. Ask your doctor what other things that you can do to help prevent falls. This information is not intended to replace advice given to you by your health care provider. Make sure you discuss any questions you have with your health care provider. Document Released: 05/04/2009 Document Revised: 12/14/2015 Document Reviewed: 08/12/2014 Elsevier Interactive Patient Education  2017 [employer].

## 2021-02-19 ENCOUNTER — Other Ambulatory Visit: Payer: Self-pay

## 2021-02-19 ENCOUNTER — Ambulatory Visit (INDEPENDENT_AMBULATORY_CARE_PROVIDER_SITE_OTHER): Payer: Medicare Other | Admitting: Family Medicine

## 2021-02-19 ENCOUNTER — Encounter: Payer: Self-pay | Admitting: Family Medicine

## 2021-02-19 VITALS — BP 130/82 | HR 65 | Temp 97.7°F | Ht 70.0 in | Wt 175.0 lb

## 2021-02-19 DIAGNOSIS — Z0000 Encounter for general adult medical examination without abnormal findings: Secondary | ICD-10-CM

## 2021-02-19 DIAGNOSIS — D649 Anemia, unspecified: Secondary | ICD-10-CM

## 2021-02-19 DIAGNOSIS — Z7189 Other specified counseling: Secondary | ICD-10-CM

## 2021-02-19 DIAGNOSIS — I10 Essential (primary) hypertension: Secondary | ICD-10-CM

## 2021-02-19 DIAGNOSIS — E785 Hyperlipidemia, unspecified: Secondary | ICD-10-CM

## 2021-02-19 DIAGNOSIS — R202 Paresthesia of skin: Secondary | ICD-10-CM

## 2021-02-19 DIAGNOSIS — I71019 Dissection of thoracic aorta, unspecified: Secondary | ICD-10-CM

## 2021-02-19 DIAGNOSIS — I7101 Dissection of thoracic aorta: Secondary | ICD-10-CM | POA: Diagnosis not present

## 2021-02-19 DIAGNOSIS — R569 Unspecified convulsions: Secondary | ICD-10-CM

## 2021-02-19 DIAGNOSIS — R351 Nocturia: Secondary | ICD-10-CM

## 2021-02-19 LAB — TSH: TSH: 1.32 u[IU]/mL (ref 0.35–5.50)

## 2021-02-19 LAB — VITAMIN B12: Vitamin B-12: 737 pg/mL (ref 211–911)

## 2021-02-19 NOTE — Patient Instructions (Addendum)
Try restarting tamsulosin and see if urination at night gets better.  Careful about getting lightheaded.    Go to the lab on the way out.   If you have mychart we'll likely use that to update you.     I would continue iron for now and recheck labs in about 3-4 months.   When you need refills then let me know.    Take care.  Glad to see you.

## 2021-02-19 NOTE — Progress Notes (Signed)
This visit occurred during the SARS-CoV-2 public health emergency.  Safety protocols were in place, including screening questions prior to the visit, additional usage of staff PPE, and extensive cleaning of exam room while observing appropriate contact time as indicated for disinfecting solutions.  History of aortic dissection.  He is back to biking about 50 miles in a week, 15-20 miles at a time.  Recent echo d/w pt.  Prev infection site on the chest wall has healed.    Right fingertips numb, not on the L side.   Numbness in toes on both feet.  All going on for a few months.   Normal grip.     Hypertension:    Using medication without problems or lightheadedness: yes Chest pain with exertion:no Edema:no Short of breath:no  Elevated Cholesterol: Using medications without problems: yes Muscle aches: no Diet compliance:yes Exercise:yes  Iron replacement, ongoing.  Labs d/w pt.    Still on trileptal at baseline, compliant.  No events.  Per neurology.  Taking flomax rarely, has nocturia.  D/w pt about restart and d/w pt about routine cautions.  Flu encouraged yearly. Shingles discussed with patient. PNA deferred given his history. Tetanus 2017. covid vaccine 2021 Colonoscopy 2018. Prostate cancer screening done in 2022 Advance directive-wife designated if patient were incapacitated.   Meds, vitals, and allergies reviewed.   ROS: Per HPI unless specifically indicated in ROS section   GEN: nad, alert and oriented HEENT: ncat NECK: supple w/o LA CV: rrr.  Soft systolic murmur noted.  Midline sternotomy well-healed PULM: ctab, no inc wob ABD: soft, +bs EXT: no edema SKIN: no acute rash

## 2021-02-21 DIAGNOSIS — R351 Nocturia: Secondary | ICD-10-CM | POA: Insufficient documentation

## 2021-02-21 DIAGNOSIS — R202 Paresthesia of skin: Secondary | ICD-10-CM | POA: Insufficient documentation

## 2021-02-21 DIAGNOSIS — D649 Anemia, unspecified: Secondary | ICD-10-CM | POA: Insufficient documentation

## 2021-02-21 NOTE — Assessment & Plan Note (Signed)
No events.  Per neurology.  I will defer.  Compliant with Trileptal.

## 2021-02-21 NOTE — Assessment & Plan Note (Signed)
Unclear source.  He does not have absent sensation but he has altered sensation.  Reasonable to check basic labs today, B12 and TSH.  See notes on labs.

## 2021-02-21 NOTE — Assessment & Plan Note (Signed)
Advance directive- wife designated if patient were incapacitated.  

## 2021-02-21 NOTE — Assessment & Plan Note (Signed)
Improving but not resolved.  Iron has increased.  Would continue iron replacement for now and recheck labs later on.  See after visit summary.  He agrees.

## 2021-02-21 NOTE — Assessment & Plan Note (Signed)
Labs discussed with patient.  Continue atorvastatin. 

## 2021-02-21 NOTE — Assessment & Plan Note (Signed)
He can restart Flomax, routine cautions given to patient.  He will update me as needed.

## 2021-02-21 NOTE — Assessment & Plan Note (Signed)
Discussed his exercise routine.  He is back to biking 50 miles in a week doing 15-20 miles at a time.  He is doing well with that.  Recent echo discussed with patient.  Not having exertional limitation.

## 2021-02-21 NOTE — Assessment & Plan Note (Signed)
Controlled.  Continue amlodipine hydrochlorothiazide losartan and metoprolol.

## 2021-02-21 NOTE — Assessment & Plan Note (Signed)
Flu encouraged yearly. Shingles discussed with patient. PNA deferred given his history. Tetanus 2017. covid vaccine 2021 Colonoscopy 2018. Prostate cancer screening done in 2022 Advance directive-wife designated if patient were incapacitated.

## 2021-02-22 NOTE — Progress Notes (Signed)
Chief complaint: Aneurysm surveillance  History of present illness: 70 year old man underwent repair of type a dissection last fall.  He now presents for repeat chest imaging to assess the residual descending component of the dissection.  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   Seizure ([place]) 04/01/2011   Medicare annual wellness visit, subsequent 01/07/2012   Advance care planning 12/09/2014   Sleep apnea syndrome 11/28/2014   Healthcare maintenance 02/04/2017   Aortic dissection, thoracic ([place]) 05/29/2020   Atrial fibrillation ([place]) 06/06/2020   DVT (deep venous thrombosis) ([place]) 06/14/2020   Urinary urgency 07/12/2020   Headache 07/12/2020   S/P aortic dissection repair 11/26/2020   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   Paresthesia 02/21/2021   Nocturia 02/21/2021   Anemia 02/21/2021   Resolved Ambulatory Problems    Diagnosis Date Noted   LEUKOPENIA, MILD 12/27/2009   HEMOCCULT POSITIVE STOOL 01/04/2009   Cough 01/19/2013   Cough 05/16/2015   Quadriceps muscle rupture 01/31/2016   Partial epilepsy with impairment of consciousness, 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 30 tablet 2   apixaban (ELIQUIS) 5 MG TABS tablet Take 1 tablet (5 mg total) by mouth 2 (two) times daily. Take 2 tablets ('10mg'$ ) twice daily for 7 days, then 1 tablet ('5mg'$ )  twice daily 120 tablet 1   atorvastatin (LIPITOR) 10 MG tablet Take 1 tablet (10 mg total) by mouth daily. 90 tablet 3   hydrochlorothiazide (HYDRODIURIL) 25 MG tablet Take 1 tablet by mouth once as needed.     losartan (COZAAR) 25 MG tablet TAKE 1 TABLET BY MOUTH DAILY 90 tablet 1   Multiple Vitamin (MULTIVITAMIN) tablet Take 1 tablet by mouth daily. Alive     oxcarbazepine (TRILEPTAL) 600 MG tablet Take 600 mg by mouth 2 (two) times daily.     tamsulosin (FLOMAX) 0.4 MG CAPS capsule TAKE 1 CAPSULE BY MOUTH EVERY DAY 90 capsule 1   metoprolol succinate (TOPROL-XL) 100 MG 24 hr tablet Take 1 tablet (100 mg total) by mouth daily. Take with or immediately following a meal. 90 tablet 3   No current facility-administered medications on file prior to visit.   Physical exam:  BP (!) 144/91 (BP Location: Right Arm, Patient Position: Sitting, Cuff Size: Normal)   Pulse 72   Resp 20   Ht '5\' 10"'$  (1.778 m)   SpO2 99% Comment: RA  BMI 24.22 kg/m  Well-appearing, no acute distress Clear to auscultation bilaterally Regular rate and rhythm Extremities warm and well-perfused, pulses intact  Imaging:  I have personally reviewed his available imaging studies from 12/14/2020 and agree with the interpretation.  There is a small aneurysm associated with a descending false lumen status post aortic dissection   assessment/plan: Follow-up in 1 year with repeat chest imaging  Betheny Suchecki Z. Orvan Seen, [place]

## 2021-02-27 NOTE — Progress Notes (Signed)
Patient referred by Tonia Ghent, MD for aortic dissection  Subjective:   Juan Wagner, male    DOB: 01-20-1951, 70 y.o.   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  He was taking a recruitment class and was doing active exercises such as the BX and push-ups, when he had sudden onset chest pain and near syncope.  He was rushed to emergency room at [facility] where he was found to have aortic dissection on work-up, details below.  He underwent emergent repair of ascending aorta aneurysm by Dr. Orvan Seen.  Postoperative stay was largely uneventful other than brief postop A. Fib.  Patient was admitted in 08/2020 with nonhealing sternal wound.  He underwent surgical debridement by Dr. Orvan Seen.  He was discharged with wound VAC, which is now removed.  His blood pressure remains elevated.  He admits to eating high salt diet at times.  Patient underwent genetic testing given history of thoracic aorta aneurysm rupture.  No variant associated with familial aortopathy was found.    Patient is doing well.  He is back to riding 15+ miles daily, in addition to other aerobic and weight training exercises.  He is not having episodes of any chest pain, shortness of breath, leg edema.  Blood pressure remains elevated.  He recently underwent CT scan chest which showed persistent residual chronic dissection extending into the right subclavian and right common carotid artery.  There was also a small area of aneurysm and descending aorta.  He saw Dr. Orvan Seen after the CT scan.  Continue blood pressure control was recommended.  Current Outpatient  Medications on File Prior to Visit  Medication Sig Dispense Refill   amLODipine (NORVASC) 10 MG tablet Take 1 tablet (10 mg total) by mouth daily. 30 tablet 2   apixaban (ELIQUIS) 5 MG TABS tablet Take 1 tablet (5 mg total) by mouth 2 (two) times daily. Take 2 tablets (46m) twice daily for 7 days, then 1 tablet (5106m twice daily 120 tablet 1   atorvastatin (LIPITOR) 10 MG tablet Take 1 tablet (10 mg total) by mouth daily. 90 tablet 3   ferrous sulfate 325 (65 FE) MG tablet TAKE 1 TABLET BY MOUTH EVERY DAY WITH BREAKFAST 100 tablet 1   hydrochlorothiazide (HYDRODIURIL) 25 MG tablet Take 1 tablet by mouth once as needed.     losartan (COZAAR) 25 MG tablet TAKE 1 TABLET BY MOUTH DAILY 90 tablet 1   metoprolol succinate (TOPROL-XL) 100 MG 24 hr tablet Take 1 tablet (100 mg total) by mouth daily. Take with or immediately following a meal. 90 tablet 3   Multiple Vitamin (MULTIVITAMIN) tablet Take 1 tablet by mouth daily. Alive     oxcarbazepine (TRILEPTAL) 600 MG tablet Take 600 mg by mouth 2 (two) times daily.     tamsulosin (FLOMAX) 0.4 MG CAPS capsule TAKE 1 CAPSULE BY MOUTH EVERY DAY 90 capsule 1   No current facility-administered medications on file prior to visit.    Cardiovascular and other pertinent studies:  Echocardiogram 02/12/2021:  Normal LV systolic function with EF 56%. Left ventricle cavity is normal  in size.  Mild concentric remodeling of the left ventricle. Normal global  wall motion. Normal diastolic filling pattern. Calculated EF 56%.  Right atrial cavity is moderately dilated.  Right ventricle cavity is normal in size. Moderately reduced right  ventricular function.  Trileaflet aortic valve. No evidence of aortic stenosis. Mild (Grade I)  aortic regurgitation.  Trace tricuspid regurgitation. No evidence of tricuspid valve stenosis. No  evidence of pulmonary hypertension.  The aortic root is mildly dilated at 4.2 cm.  Compared to the echocardiogram done on 05/29/2020,  ascending aortic  aneurysm measuring 4.8 cm is not present and there is no evidence of  aortic dissection flap.  Patient is S/P ascending aortic aneurysm repair.  CTA chest 12/14/2020: Status post surgical repair of ascending thoracic aortic dissection. Residual dissection is seen involving the transverse aortic arch and proximal descending thoracic aorta, with maximum aneurysmal dilatation of proximal descending thoracic aorta at 4.7 cm.   Dissection flap is seen extending into the right subclavian artery and proximal right common carotid artery.   No definite abnormality is noted in the abdomen or pelvis.  EKG 11/27/2020: Sinus rhythm 65 bpm First degree A-V block  Left anterior fascicular block Left ventricular hypertrophy T wave inversions inferolateral leads, likely related to LVH No significant change compared to previous EKK  Vascular US 06/12/2020: RIGHT:  - Findings consistent with acute deep vein thrombosis involving the right gastrocnemius veins.  - There is no evidence of superficial venous thrombosis.      Op note 05/29/2020 (Dr. Orvan Seen): REPAIR OF ACUTE ASCENDING THORACIC AORTIC DISSECTION USING HEMASHIELD PLATINUM WOVEN DOUBLE VELOUR VASCULAR GRAFT 32 MM X 30 CM (N/A) TRANSESOPHAGEAL ECHOCARDIOGRAM (TEE) (N/A)  CT Chest 05/29/2020: Changes consistent with a type a dissection of the aorta involving the anterior and descending portions with extension into the right innominate artery and into the origin of the left subclavian artery. No definitive involvement of the common carotid artery on the left is noted. The extent of the dissection flap distally is somewhat difficult to evaluate although felt to extend into the midportion of the descending thoracic aorta. Arterial timed CTA of the chest may be helpful as clinically necessary.   Adjacent to the ascending aorta just above the aortic root is a periaortic hematoma which is better visualized on the regional images  of the abdomen and pelvis film. This measures approximately 3.7 x 2.4 cm in greatest dimension but again is somewhat limited in evaluation due to the timing of the contrast bolus and lack of delayed images in the chest.   Central perivascular edema is noted within the lungs bilaterally likely related to central compression of the vasculature related to aortic changes.   No evidence of pulmonary emboli although there is significant attenuation of the lumen of the pulmonary artery related to the periaortic hematoma.   Aortic aneurysm NOS (ICD10-I71.9).   Critical Value/emergent results were called by telephone at the time of interpretation on 05/29/2020 at 11:44 am to Dr. Roosevelt Locks, who verbally acknowledged these results.     Recent labs: 02/14/2021: Glucose 96, BUN/Cr 20/1.37. EGFR 51. Na/K 135/4.0. Rest of the CMP normal H/H 11/35. MCV 86. Platelets 236 TSH 1.3 normal  10/13/2020: Glucose 83, BUN/Cr 16/1.14. EGFR 69. Na/K 140/5.0.  BNP 99.7 normal Chol 134, TG 79, HDL 65, LDL 54  09/07/2020: Glucose 97, BUN/Cr 14/1.12. EGFR >60. Na/K 136/3.8.  Albumin 3.2.  Rest of the CMP normal H/H 10.7/33.0. MCV 81. Platelets 277  06/13/2020: Glucose 128, BUN/Cr 14/1.23. EGFR >60.  Na/K 134/5.1. Rest of the CMP normal H/H 9.4/28.5. MCV 89.3. Platelets 728 HbA1C, lipids N/A   Review of Systems  Cardiovascular:  Negative for chest pain, dyspnea on exertion, leg swelling, palpitations and syncope.        Vitals:   02/28/21 1126 02/28/21 1127  BP: (!) 152/96 (!) 141/90  Pulse: 75 71  Resp: 16   Temp: 98 F (36.7 C)   SpO2: 98%      Body mass index is 24.68 kg/m. Filed Weights   02/28/21 1126  Weight: 172 lb (78 kg)     Objective:   Physical Exam Vitals and nursing note reviewed.  Constitutional:      General: He is not in acute distress. Neck:     Vascular: No JVD.  Cardiovascular:     Rate and Rhythm: Normal rate and regular rhythm.     Heart sounds: Murmur  heard.  High-pitched blowing decrescendo early diastolic murmur is present with a grade of 1/4 at the upper right sternal border radiating to the apex.  Pulmonary:     Effort: Pulmonary effort is normal.     Breath sounds: Normal breath sounds. No wheezing or rales.  Musculoskeletal:     Right lower leg: No edema.     Left lower leg: No edema.        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 Genetic testing yielded no variant associated with familial aortopathy. Rested well chronic dissection extending to right common carotid and right subclavian artery, along with small area of aneurysmal descending aorta.  Continue blood pressure and heart rate control. See below.  Hypertension: Continue metoprolol succinate 100 mg daily, amlodipine 10 mg daily.   Change losartan 25 mg daily, and hydrochlorothiazide 25 mg as needed  to losartan-HCTZ 50-12.5 mg daily.   Check BMP in 1 week  Paroxysmal A. Fib: CHA2DS2VASc score 2, annual stroke risk 2% While patient was not initially on Eliquis, he is now on Eliquis for DVT anyway. Given that his stroke risk will only increase with increasing age, will continue Eliquis 5 mg daily.  DVT: H/o right gastrocnemius DVT, likely related to immobility post surgery. Remains on Eliquis given paroxysmal A. Fib.  F/u in 3 months   Nigel Mormon, MD Pager: [phone] Office: [phone]

## 2021-02-28 ENCOUNTER — Other Ambulatory Visit: Payer: Self-pay

## 2021-02-28 ENCOUNTER — Encounter: Payer: Self-pay | Admitting: Cardiology

## 2021-02-28 ENCOUNTER — Ambulatory Visit: Payer: Medicare Other | Admitting: Cardiology

## 2021-02-28 VITALS — BP 141/90 | HR 71 | Temp 98.0°F | Resp 16 | Ht 70.0 in | Wt 172.0 lb

## 2021-02-28 DIAGNOSIS — I10 Essential (primary) hypertension: Secondary | ICD-10-CM

## 2021-02-28 DIAGNOSIS — Z9889 Other specified postprocedural states: Secondary | ICD-10-CM

## 2021-02-28 MED ORDER — LOSARTAN POTASSIUM-HCTZ 50-12.5 MG PO TABS: 1.0000 | ORAL_TABLET | Freq: Every day | ORAL | 3 refills | Status: DC

## 2021-03-01 ENCOUNTER — Other Ambulatory Visit (HOSPITAL_COMMUNITY): Payer: Self-pay | Admitting: Cardiology

## 2021-03-02 LAB — BASIC METABOLIC PANEL
BUN/Creatinine Ratio: 17 (ref 10–24)
BUN: 23 mg/dL (ref 8–27)
CO2: 26 mmol/L (ref 20–29)
Calcium: 10 mg/dL (ref 8.6–10.2)
Chloride: 102 mmol/L (ref 96–106)
Creatinine, Ser: 1.37 mg/dL — ABNORMAL HIGH (ref 0.76–1.27)
Glucose: 94 mg/dL (ref 65–99)
Potassium: 4.7 mmol/L (ref 3.5–5.2)
Sodium: 140 mmol/L (ref 134–144)
eGFR: 55 mL/min/{1.73_m2} — ABNORMAL LOW (ref >59)

## 2021-04-15 ENCOUNTER — Other Ambulatory Visit: Payer: Self-pay | Admitting: Cardiology

## 2021-04-15 DIAGNOSIS — Z9889 Other specified postprocedural states: Secondary | ICD-10-CM

## 2021-04-23 ENCOUNTER — Ambulatory Visit (INDEPENDENT_AMBULATORY_CARE_PROVIDER_SITE_OTHER): Payer: Medicare Other | Admitting: Nurse Practitioner

## 2021-04-23 VITALS — Temp 102.7°F

## 2021-04-23 DIAGNOSIS — U071 COVID-19: Secondary | ICD-10-CM | POA: Diagnosis not present

## 2021-04-23 MED ORDER — MOLNUPIRAVIR EUA 200MG CAPSULE: 4.0000 | ORAL_CAPSULE | Freq: Two times a day (BID) | ORAL | 0 refills | Status: AC

## 2021-04-23 NOTE — Progress Notes (Signed)
Patient ID: Juan Wagner, male    DOB: 10-Jul-1951, 70 y.o.   MRN: [id number]  Virtual visit completed through [employer], a video enabled telemedicine application. Due to national recommendations of social distancing due to COVID-19, a virtual visit is felt to be most appropriate for this patient at this time. Reviewed limitations, risks, security and privacy concerns of performing a virtual visit and the availability of in person appointments. I also reviewed that there may be a patient responsible charge related to this service. The patient agreed to proceed.   Patient location: home Provider location: [place] at [facility], office Persons participating in this virtual visit: patient, provider   If any vitals were documented, they were collected by patient at home unless specified below.    Temp (!) 102.7 F (39.3 C) Comment: per patient   CC: Covid 19 infection  Subjective:   HPI: Juan Wagner is a 70 y.o. male presenting on 04/23/2021 for Covid Positive (On 04/22/21, sx started 04/22/21-nasal congestion, post nasal drip, slight cough, slight headache. Temp was 100 on 04/22/21)  Symptoms started on 04/22/2021 At home test positive on 04/22/2021 Vaccinated for Covid. Pfizer x2 with a booster Tylenol one dose. That seems to have been helpful.    Relevant past medical, surgical, family and social history reviewed and updated as indicated. Interim medical history since our last visit reviewed. Allergies and medications reviewed and updated. Outpatient Medications Prior to Visit  Medication Sig Dispense Refill   amLODipine (NORVASC) 10 MG tablet Take 1 tablet (10 mg total) by mouth daily. 30 tablet 2   apixaban (ELIQUIS) 5 MG TABS tablet Take 1 tablet (5 mg total) by mouth 2 (two) times daily. Take 2 tablets (79m) twice daily for 7 days, then 1 tablet (566m twice daily 120 tablet 1   atorvastatin (LIPITOR) 10 MG tablet Take 1 tablet (10 mg total) by mouth daily. 90 tablet 3    ferrous sulfate 325 (65 FE) MG tablet TAKE 1 TABLET BY MOUTH EVERY DAY WITH BREAKFAST 100 tablet 1   losartan-hydrochlorothiazide (HYZAAR) 50-12.5 MG tablet Take 1 tablet by mouth daily. 30 tablet 3   Multiple Vitamin (MULTIVITAMIN) tablet Take 1 tablet by mouth daily. Alive     oxcarbazepine (TRILEPTAL) 600 MG tablet Take 600 mg by mouth 2 (two) times daily.     tamsulosin (FLOMAX) 0.4 MG CAPS capsule TAKE 1 CAPSULE BY MOUTH EVERY DAY 90 capsule 1   metoprolol succinate (TOPROL-XL) 100 MG 24 hr tablet Take 1 tablet (100 mg total) by mouth daily. Take with or immediately following a meal. 90 tablet 3   No facility-administered medications prior to visit.     Per HPI unless specifically indicated in ROS section below Review of Systems  Constitutional:  Positive for chills and fever. Negative for fatigue.  HENT:  Positive for congestion and postnasal drip. Negative for ear discharge, ear pain and sore throat.   Respiratory:  Positive for cough (clear). Negative for shortness of breath.   Cardiovascular:  Negative for chest pain.  Gastrointestinal:  Negative for diarrhea, nausea and vomiting.  Musculoskeletal:  Negative for arthralgias and myalgias.  Neurological:  Positive for headaches.  Objective:  Temp (!) 102.7 F (39.3 C) Comment: per patient  Wt Readings from Last 3 Encounters:  02/28/21 172 lb (78 kg)  02/19/21 175 lb (79.4 kg)  11/27/20 168 lb 12.8 oz (76.6 kg)       Physical exam: Gen: alert Pulm: speaks in complete sentences without  increased work of breathing Psych: normal mood, normal thought content      Results for orders placed or performed in visit on 16/10/96  Basic metabolic panel  Result Value Ref Range   Glucose 94 65 - 99 mg/dL   BUN 23 8 - 27 mg/dL   Creatinine, Ser 1.37 (H) 0.76 - 1.27 mg/dL   eGFR 55 (L) >59 mL/min/1.73   BUN/Creatinine Ratio 17 10 - 24   Sodium 140 134 - 144 mmol/L   Potassium 4.7 3.5 - 5.2 mmol/L   Chloride 102 96 - 106 mmol/L    CO2 26 20 - 29 mmol/L   Calcium 10.0 8.6 - 10.2 mg/dL   Assessment & Plan:   Problem List Items Addressed This Visit       Other   COVID-19 - Primary    Patient tested positive for COVID-19.  Patient has been vaccinated against COVID.  Is using over-the-counter treatments with some relief.  Did discuss treatment options given medication regimen he will be eligible for molnupiravir.  Discussed this medication is a emergency use authorized only.  Did discuss side effects of medication.  We will begin molnupiravir.  Did discuss signs and symptoms of stroke which patient needs to be urgently or emergently evaluated at facility.  Patient knowledge.  Did discuss CDC guidelines in regards to quarantine.  Start medication as soon as possible.      Relevant Medications   molnupiravir EUA (LAGEVRIO) 200 mg CAPS capsule     No orders of the defined types were placed in this encounter.  No orders of the defined types were placed in this encounter.  Phone call lasted for  10 mins and 50 sec  I discussed the assessment and treatment plan with the patient. The patient was provided an opportunity to ask questions and all were answered. The patient agreed with the plan and demonstrated an understanding of the instructions. The patient was advised to call back or seek an in-person evaluation if the symptoms worsen or if the condition fails to improve as anticipated.  Follow up plan: No follow-ups on file.  Romilda Garret, NP

## 2021-04-23 NOTE — Assessment & Plan Note (Signed)
Patient tested positive for COVID-19.  Patient has been vaccinated against COVID.  Is using over-the-counter treatments with some relief.  Did discuss treatment options given medication regimen he will be eligible for molnupiravir.  Discussed this medication is a emergency use authorized only.  Did discuss side effects of medication.  We will begin molnupiravir.  Did discuss signs and symptoms of stroke which patient needs to be urgently or emergently evaluated at facility.  Patient knowledge.  Did discuss CDC guidelines in regards to quarantine.  Start medication as soon as possible.

## 2021-04-23 NOTE — Patient Instructions (Signed)
Nice to talk to you today

## 2021-04-24 NOTE — Telephone Encounter (Signed)
From pt I sent this to you yesterday as well .

## 2021-04-24 NOTE — Telephone Encounter (Signed)
Spoke with the patient. No drug-drug interactions noted.

## 2021-04-30 ENCOUNTER — Other Ambulatory Visit: Payer: Self-pay | Admitting: Family Medicine

## 2021-04-30 DIAGNOSIS — E785 Hyperlipidemia, unspecified: Secondary | ICD-10-CM

## 2021-05-13 ENCOUNTER — Other Ambulatory Visit: Payer: Self-pay | Admitting: Cardiology

## 2021-05-13 DIAGNOSIS — I10 Essential (primary) hypertension: Secondary | ICD-10-CM

## 2021-05-25 ENCOUNTER — Other Ambulatory Visit: Payer: Self-pay

## 2021-05-25 ENCOUNTER — Ambulatory Visit (INDEPENDENT_AMBULATORY_CARE_PROVIDER_SITE_OTHER): Payer: Medicare Other

## 2021-05-25 DIAGNOSIS — Z23 Encounter for immunization: Secondary | ICD-10-CM | POA: Diagnosis not present

## 2021-05-26 ENCOUNTER — Other Ambulatory Visit: Payer: Self-pay | Admitting: Cardiology

## 2021-05-26 DIAGNOSIS — I10 Essential (primary) hypertension: Secondary | ICD-10-CM

## 2021-05-31 ENCOUNTER — Ambulatory Visit: Payer: Medicare Other | Admitting: Cardiology

## 2021-06-04 ENCOUNTER — Other Ambulatory Visit: Payer: Medicare Other

## 2021-06-13 ENCOUNTER — Other Ambulatory Visit: Payer: Self-pay

## 2021-06-13 ENCOUNTER — Encounter: Payer: Self-pay | Admitting: Cardiology

## 2021-06-13 ENCOUNTER — Ambulatory Visit: Payer: Medicare Other | Admitting: Cardiology

## 2021-06-13 VITALS — BP 120/75 | HR 76 | Temp 97.7°F | Resp 16 | Ht 70.0 in | Wt 172.0 lb

## 2021-06-13 DIAGNOSIS — I82509 Chronic embolism and thrombosis of unspecified deep veins of unspecified lower extremity: Secondary | ICD-10-CM | POA: Insufficient documentation

## 2021-06-13 DIAGNOSIS — I10 Essential (primary) hypertension: Secondary | ICD-10-CM

## 2021-06-13 DIAGNOSIS — Z9889 Other specified postprocedural states: Secondary | ICD-10-CM

## 2021-06-13 NOTE — Progress Notes (Signed)
Patient referred by Tonia Ghent, MD for aortic dissection  Subjective:   Juan Wagner, male    DOB: [birth year], 70 y.o.   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  He was taking a recruitment class and was doing active exercises such as the BX and push-ups, when he had sudden onset chest pain and near syncope.  He was rushed to emergency room at [facility] where he was found to have aortic dissection on work-up, details below.  He underwent emergent repair of ascending aorta aneurysm by Dr. Orvan Seen.  Postoperative stay was largely uneventful other than brief postop A. Fib.  Patient was admitted in 08/2020 with nonhealing sternal wound.  He underwent surgical debridement by Dr. Orvan Seen.  He was discharged with wound VAC, which is now removed.  His blood pressure remains elevated.  He admits to eating high salt diet at times.  Patient underwent genetic testing given history of thoracic aorta aneurysm rupture.  No variant associated with familial aortopathy was found.    Patient is doing well.  Blood pressure is well controlled. He is doing moderate aerobid activity without any complaints.  Current Outpatient Medications on File Prior to Visit  Medication Sig Dispense Refill   amLODipine (NORVASC) 10 MG tablet TAKE 1 TABLET BY MOUTH EVERY DAY 90 tablet 1   apixaban (ELIQUIS) 5 MG TABS tablet Take 1 tablet (5 mg total) by mouth 2 (two) times daily. Take 2 tablets (72m) twice daily for 7 days, then 1 tablet (565m twice daily 120 tablet 1   atorvastatin (LIPITOR) 10 MG tablet TAKE 1 TABLET BY NOUTH DAILY 90 tablet 2    ferrous sulfate 325 (65 FE) MG tablet TAKE 1 TABLET BY MOUTH EVERY DAY WITH BREAKFAST 100 tablet 1   losartan-hydrochlorothiazide (HYZAAR) 50-12.5 MG tablet TAKE 1 TABLET BY MOUTH EVERY DAY 90 tablet 1   metoprolol succinate (TOPROL-XL) 100 MG 24 hr tablet Take 1 tablet (100 mg total) by mouth daily. Take with or immediately following a meal. 90 tablet 3   Multiple Vitamin (MULTIVITAMIN) tablet Take 1 tablet by mouth daily. Alive     oxcarbazepine (TRILEPTAL) 600 MG tablet Take 600 mg by mouth 2 (two) times daily.     tamsulosin (FLOMAX) 0.4 MG CAPS capsule TAKE 1 CAPSULE BY MOUTH EVERY DAY 90 capsule 1   No current facility-administered medications on file prior to visit.    Cardiovascular and other pertinent studies:  EKG 06/13/2021: Sinus rhythm 75 bpm First degree A-V block  LVH IVCD  Echocardiogram 02/12/2021:  Normal LV systolic function with EF 56%. Left ventricle cavity is normal  in size. Mild concentric remodeling of the left ventricle. Normal global  wall motion. Normal diastolic filling pattern. Calculated EF 56%.  Right atrial cavity is moderately dilated.  Right ventricle cavity is normal in size. Moderately reduced right  ventricular function.  Trileaflet aortic valve. No evidence of aortic stenosis. Mild (Grade I)  aortic regurgitation.  Trace tricuspid regurgitation. No evidence of tricuspid valve stenosis. No  evidence of pulmonary hypertension.  The aortic root is mildly dilated at  4.2 cm.  Compared to the echocardiogram done on 05/29/2020, ascending aortic  aneurysm measuring 4.8 cm is not present and there is no evidence of  aortic dissection flap.  Patient is S/P ascending aortic aneurysm repair.  CTA chest 12/14/2020: Status post surgical repair of ascending thoracic aortic dissection. Residual dissection is seen involving the transverse aortic arch and proximal descending thoracic aorta, with maximum aneurysmal dilatation of proximal descending thoracic  aorta at 4.7 cm.   Dissection flap is seen extending into the right subclavian artery and proximal right common carotid artery.   No definite abnormality is noted in the abdomen or pelvis.  EKG 11/27/2020: Sinus rhythm 65 bpm First degree A-V block  Left anterior fascicular block Left ventricular hypertrophy T wave inversions inferolateral leads, likely related to LVH No significant change compared to previous EKK  Vascular US 06/12/2020: RIGHT:  - Findings consistent with acute deep vein thrombosis involving the right gastrocnemius veins.  - There is no evidence of superficial venous thrombosis.      Op note 05/29/2020 (Dr. Orvan Seen): REPAIR OF ACUTE ASCENDING THORACIC AORTIC DISSECTION USING HEMASHIELD PLATINUM WOVEN DOUBLE VELOUR VASCULAR GRAFT 32 MM X 30 CM (N/A) TRANSESOPHAGEAL ECHOCARDIOGRAM (TEE) (N/A)  CT Chest 05/29/2020: Changes consistent with a type a dissection of the aorta involving the anterior and descending portions with extension into the right innominate artery and into the origin of the left subclavian artery. No definitive involvement of the common carotid artery on the left is noted. The extent of the dissection flap distally is somewhat difficult to evaluate although felt to extend into the midportion of the descending thoracic aorta. Arterial timed CTA of the chest may be helpful as clinically necessary.   Adjacent to the ascending aorta just above the aortic root is a periaortic hematoma which is better visualized on the regional images of the abdomen and pelvis film. This measures approximately 3.7 x 2.4 cm in greatest dimension but again is somewhat limited in evaluation due to the timing of the contrast bolus and lack of delayed images in the chest.   Central perivascular edema is noted within the lungs bilaterally likely related to central compression of the vasculature related to aortic changes.   No evidence of pulmonary emboli although there is  significant attenuation of the lumen of the pulmonary artery related to the periaortic hematoma.   Aortic aneurysm NOS (ICD10-I71.9).   Critical Value/emergent results were called by telephone at the time of interpretation on 05/29/2020 at 11:44 am to Dr. Roosevelt Locks, who verbally acknowledged these results.     Recent labs: 03/01/2021: Glucose 94, BUN/Cr 23/1.37. EGFR 55. Na/K 140/4.7.   02/14/2021: Glucose 96, BUN/Cr 20/1.37. EGFR 51. Na/K 135/4.0. Rest of the CMP normal H/H 11/35. MCV 86. Platelets 236 TSH 1.3 normal  10/13/2020: Glucose 83, BUN/Cr 16/1.14. EGFR 69. Na/K 140/5.0.  BNP 99.7 normal Chol 134, TG 79, HDL 65, LDL 54  09/07/2020: Glucose 97, BUN/Cr 14/1.12. EGFR >60. Na/K 136/3.8.  Albumin 3.2.  Rest of the CMP normal H/H 10.7/33.0. MCV 81. Platelets 277  06/13/2020: Glucose 128, BUN/Cr 14/1.23. EGFR >60. Na/K 134/5.1. Rest of the CMP normal H/H 9.4/28.5. MCV 89.3. Platelets 728 HbA1C, lipids N/A   Review of Systems  Cardiovascular:  Negative for chest pain, dyspnea on exertion, leg swelling, palpitations and syncope.        Vitals:   06/13/21 1210  BP: 120/75  Pulse: 76  Resp: 16  Temp: 97.7 F (36.5 C)  SpO2: 98%  Body mass index is 24.68 kg/m. Filed Weights   06/13/21 1210  Weight: 172 lb (78 kg)     Objective:   Physical Exam Vitals and nursing note reviewed.  Constitutional:      General: He is not in acute distress. Neck:     Vascular: No JVD.  Cardiovascular:     Rate and Rhythm: Normal rate and regular rhythm.     Heart sounds: Murmur heard.  High-pitched blowing decrescendo early diastolic murmur is present with a grade of 1/4 at the upper right sternal border radiating to the apex.  Pulmonary:     Effort: Pulmonary effort is normal.     Breath sounds: Normal breath sounds. No wheezing or rales.  Musculoskeletal:     Right lower leg: No edema.     Left lower leg: No edema.        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 Genetic testing yielded no variant associated with familial aortopathy. Rested well chronic dissection extending to right common carotid and right subclavian artery, along with small area of aneurysmal descending aorta.  Continue blood pressure and heart rate control. See below.  Hypertension: Now well controlled.  Paroxysmal A. Fib: CHA2DS2VASc score 2, annual stroke risk 2% While patient was not initially on Eliquis, he is now on Eliquis for DVT anyway. Given that his stroke risk will only increase with increasing age, will continue Eliquis 5 mg daily.  DVT: H/o right gastrocnemius DVT, likely related to immobility post surgery. Remains on Eliquis given paroxysmal A. Fib.  F/u in 6 months   Nigel Mormon, MD Pager: [phone] Office: [phone]

## 2021-06-18 ENCOUNTER — Other Ambulatory Visit: Payer: Self-pay | Admitting: Cardiology

## 2021-06-18 ENCOUNTER — Other Ambulatory Visit: Payer: Self-pay | Admitting: Family Medicine

## 2021-06-18 DIAGNOSIS — Z9889 Other specified postprocedural states: Secondary | ICD-10-CM

## 2021-06-18 DIAGNOSIS — D649 Anemia, unspecified: Secondary | ICD-10-CM

## 2021-06-20 ENCOUNTER — Other Ambulatory Visit (INDEPENDENT_AMBULATORY_CARE_PROVIDER_SITE_OTHER): Payer: Medicare Other

## 2021-06-20 ENCOUNTER — Other Ambulatory Visit: Payer: Self-pay

## 2021-06-20 DIAGNOSIS — D649 Anemia, unspecified: Secondary | ICD-10-CM | POA: Diagnosis not present

## 2021-06-20 LAB — CBC WITH DIFFERENTIAL/PLATELET
Basophils Absolute: 0 10*3/uL (ref 0.0–0.1)
Basophils Relative: 1.1 % (ref 0.0–3.0)
Eosinophils Absolute: 0 10*3/uL (ref 0.0–0.7)
Eosinophils Relative: 1.4 % (ref 0.0–5.0)
HCT: 35.3 % — ABNORMAL LOW (ref 39.0–52.0)
Hemoglobin: 11.7 g/dL — ABNORMAL LOW (ref 13.0–17.0)
Lymphocytes Relative: 24.2 % (ref 12.0–46.0)
Lymphs Abs: 0.8 10*3/uL (ref 0.7–4.0)
MCHC: 33.2 g/dL (ref 30.0–36.0)
MCV: 86.8 fl (ref 78.0–100.0)
Monocytes Absolute: 0.6 10*3/uL (ref 0.1–1.0)
Monocytes Relative: 16.3 % — ABNORMAL HIGH (ref 3.0–12.0)
Neutro Abs: 2 10*3/uL (ref 1.4–7.7)
Neutrophils Relative %: 57 % (ref 43.0–77.0)
Platelets: 225 10*3/uL (ref 150.0–400.0)
RBC: 4.07 Mil/uL — ABNORMAL LOW (ref 4.22–5.81)
RDW: 14 % (ref 11.5–15.5)
WBC: 3.4 10*3/uL — ABNORMAL LOW (ref 4.0–10.5)

## 2021-06-20 LAB — IRON: Iron: 70 ug/dL (ref 42–165)

## 2021-07-23 ENCOUNTER — Other Ambulatory Visit: Payer: Self-pay | Admitting: Cardiology

## 2021-07-23 ENCOUNTER — Other Ambulatory Visit: Payer: Self-pay | Admitting: Family Medicine

## 2021-07-23 DIAGNOSIS — Z9889 Other specified postprocedural states: Secondary | ICD-10-CM

## 2021-08-20 ENCOUNTER — Other Ambulatory Visit: Payer: Self-pay | Admitting: Family Medicine

## 2021-08-20 ENCOUNTER — Other Ambulatory Visit: Payer: Self-pay | Admitting: Cardiology

## 2021-08-20 DIAGNOSIS — I10 Essential (primary) hypertension: Secondary | ICD-10-CM

## 2021-08-20 DIAGNOSIS — Z9889 Other specified postprocedural states: Secondary | ICD-10-CM

## 2021-08-29 ENCOUNTER — Telehealth: Payer: Self-pay

## 2021-08-29 NOTE — Telephone Encounter (Signed)
error 

## 2021-08-29 NOTE — Telephone Encounter (Deleted)
[facility] Night - Client Nonclinical Telephone Record  [facility] Client [facility] Night - Client Client Site [place] Provider Loura Pardon - MD Contact Type Call Who Is Calling Patient / Member / Family / Caregiver Caller Name [place] Phone Number [phone] Patient Name Juan Wagner Patient DOB 06/13/1950 Call Type Message Only Information Provided Reason for Call Request to Reschedule Office Appointment Initial Comment Caller wants to change an appointment. Patient request to speak to RN No Additional Comment Office hours provided. Disp. Time Disposition Final User 08/29/2021 8:56:03 AM General Information Provided Yes Melanee Spry Call Closed By: Melanee Spry Transaction Date/Time: 08/29/2021 8:54:05 AM (ET

## 2021-10-26 ENCOUNTER — Other Ambulatory Visit: Payer: Self-pay | Admitting: Cardiology

## 2021-10-26 DIAGNOSIS — I82509 Chronic embolism and thrombosis of unspecified deep veins of unspecified lower extremity: Secondary | ICD-10-CM

## 2021-11-27 ENCOUNTER — Other Ambulatory Visit: Payer: Self-pay | Admitting: Cardiology

## 2021-11-27 DIAGNOSIS — I10 Essential (primary) hypertension: Secondary | ICD-10-CM

## 2021-11-30 ENCOUNTER — Other Ambulatory Visit: Payer: Self-pay | Admitting: Cardiothoracic Surgery

## 2021-11-30 DIAGNOSIS — I7101 Dissection of ascending aorta: Secondary | ICD-10-CM

## 2021-12-11 ENCOUNTER — Other Ambulatory Visit: Payer: Self-pay | Admitting: Cardiology

## 2021-12-11 DIAGNOSIS — Z9889 Other specified postprocedural states: Secondary | ICD-10-CM

## 2021-12-11 NOTE — Progress Notes (Unsigned)
Patient referred by Tonia Ghent, MD for aortic dissection  Subjective:   Juan Wagner, male    DOB: 1950/09/03, 71 y.o.   MRN: [id number]   No chief complaint on file.    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  He was taking a recruitment class and was doing active exercises such as the BX and push-ups, when he had sudden onset chest pain and near syncope.  He was rushed to emergency room at [facility] where he was found to have aortic dissection on work-up, details below.  He underwent emergent repair of ascending aorta aneurysm by Dr. Orvan Seen.  Postoperative stay was largely uneventful other than brief postop A. Fib.  Patient was admitted in 08/2020 with nonhealing sternal wound.  He underwent surgical debridement by Dr. Orvan Seen.  He was discharged with wound VAC, which is now removed.  His blood pressure remains elevated.  He admits to eating high salt diet at times.  Patient underwent genetic testing given history of thoracic aorta aneurysm rupture.  No variant associated with familial aortopathy was found.    ***Patient is doing well.  Blood pressure is well controlled. He is doing moderate aerobid activity without any complaints.   Current Outpatient Medications:    amLODipine (NORVASC) 10 MG tablet, TAKE 1 TABLET BY MOUTH DAILY, Disp: 90 tablet, Rfl: 1   atorvastatin (LIPITOR) 10 MG tablet, TAKE 1 TABLET BY NOUTH DAILY, Disp: 90 tablet, Rfl: 2   ELIQUIS 5 MG TABS tablet, TAKE 2 TABLETS TWICE DAILY FOR 7 DAYS, THEN SWITCH TO 1 TABLET TWICE DAILY, Disp: 60 tablet, Rfl: 3   ferrous sulfate 325 (65 FE) MG tablet, TAKE 1 TABLET BY MOUTH EVERY DAY WITH BREAKFAST, Disp: 90 tablet, Rfl: 2   losartan-hydrochlorothiazide (HYZAAR) 50-12.5 MG  tablet, TAKE 1 TABLET BY MOUTH EVERY DAY, Disp: 90 tablet, Rfl: 1   metoprolol succinate (TOPROL-XL) 100 MG 24 hr tablet, TAKE 1 TABLET BY MOUTH DAILY. TAKE WITH OR IMMEDIATELY FOLLOWING A MEAL., Disp: 90 tablet, Rfl: 3   Multiple Vitamin (MULTIVITAMIN) tablet, Take 1 tablet by mouth daily. Alive, Disp: , Rfl:    oxcarbazepine (TRILEPTAL) 600 MG tablet, Take 600 mg by mouth 2 (two) times daily., Disp: , Rfl:    tamsulosin (FLOMAX) 0.4 MG CAPS capsule, TAKE 1 CAPSULE BY MOUTH EVERY DAY, Disp: 90 capsule, Rfl: 1  Cardiovascular and other pertinent studies:  EKG 06/13/2021: Sinus rhythm 75 bpm First degree A-V block  LVH IVCD  Echocardiogram 02/12/2021:  Normal LV systolic function with EF 56%. Left ventricle cavity is normal  in size. Mild concentric remodeling of the left ventricle. Normal global  wall motion. Normal diastolic filling pattern. Calculated EF 56%.  Right atrial cavity is moderately dilated.  Right ventricle cavity is normal in size. Moderately reduced right  ventricular function.  Trileaflet aortic valve. No evidence of aortic stenosis. Mild (Grade I)  aortic regurgitation.  Trace tricuspid regurgitation. No evidence of tricuspid valve stenosis. No  evidence of pulmonary hypertension.  The aortic root is mildly dilated at 4.2 cm.  Compared to the echocardiogram done on 05/29/2020, ascending aortic  aneurysm measuring 4.8 cm is not present and there is no evidence of  aortic dissection flap.  Patient is S/P ascending aortic aneurysm repair.  CTA chest 12/14/2020: Status post surgical repair of ascending thoracic aortic dissection. Residual dissection is seen involving the transverse aortic arch and proximal descending thoracic aorta, with maximum aneurysmal dilatation of proximal descending thoracic aorta at 4.7 cm.   Dissection flap is seen extending into the right subclavian artery and proximal right common carotid artery.   No definite abnormality is noted in the  abdomen or pelvis.  EKG 11/27/2020: Sinus rhythm 65 bpm First degree A-V block  Left anterior fascicular block Left ventricular hypertrophy T wave inversions inferolateral leads, likely related to LVH No significant change compared to previous EKK  Vascular US 06/12/2020: RIGHT:  - Findings consistent with acute deep vein thrombosis involving the right gastrocnemius veins.  - There is no evidence of superficial venous thrombosis.      Op note 05/29/2020 (Dr. Orvan Seen): REPAIR OF ACUTE ASCENDING THORACIC AORTIC DISSECTION USING HEMASHIELD PLATINUM WOVEN DOUBLE VELOUR VASCULAR GRAFT 32 MM X 30 CM (N/A) TRANSESOPHAGEAL ECHOCARDIOGRAM (TEE) (N/A)  CT Chest 05/29/2020: Changes consistent with a type a dissection of the aorta involving the anterior and descending portions with extension into the right innominate artery and into the origin of the left subclavian artery. No definitive involvement of the common carotid artery on the left is noted. The extent of the dissection flap distally is somewhat difficult to evaluate although felt to extend into the midportion of the descending thoracic aorta. Arterial timed CTA of the chest may be helpful as clinically necessary.   Adjacent to the ascending aorta just above the aortic root is a periaortic hematoma which is better visualized on the regional images of the abdomen and pelvis film. This measures approximately 3.7 x 2.4 cm in greatest dimension but again is somewhat limited in evaluation due to the timing of the contrast bolus and lack of delayed images in the chest.   Central perivascular edema is noted within the lungs bilaterally likely related to central compression of the vasculature related to aortic changes.   No evidence of pulmonary emboli although there is significant attenuation of the lumen of the pulmonary artery related to the periaortic hematoma.   Aortic aneurysm NOS (ICD10-I71.9).   Critical Value/emergent results  were called by telephone at the time of interpretation on 05/29/2020 at 11:44 am to Dr. Roosevelt Locks, who verbally acknowledged these results.     Recent labs: 03/01/2021: Glucose 94, BUN/Cr 23/1.37. EGFR 55. Na/K 140/4.7.   02/14/2021: Glucose 96, BUN/Cr 20/1.37. EGFR 51. Na/K 135/4.0. Rest of the CMP normal H/H 11/35. MCV 86. Platelets 236 TSH 1.3 normal  10/13/2020: Glucose 83, BUN/Cr 16/1.14. EGFR 69. Na/K 140/5.0.  BNP 99.7 normal Chol 134, TG 79, HDL 65, LDL 54  09/07/2020: Glucose 97, BUN/Cr 14/1.12. EGFR >60. Na/K 136/3.8.  Albumin 3.2.  Rest of the CMP normal H/H 10.7/33.0. MCV 81. Platelets 277  06/13/2020: Glucose 128, BUN/Cr 14/1.23. EGFR >60. Na/K 134/5.1. Rest of the CMP normal H/H 9.4/28.5. MCV 89.3. Platelets 728 HbA1C, lipids N/A   Review of Systems  Cardiovascular:  Negative for chest pain, dyspnea on exertion, leg swelling, palpitations and syncope.        There were no vitals filed for this visit.    There is no height or weight on file to calculate BMI. There were no vitals filed for this visit.    Objective:   Physical Exam Vitals and nursing note reviewed.  Constitutional:      General: He is not in acute distress. Neck:     Vascular: No  JVD.  Cardiovascular:     Rate and Rhythm: Normal rate and regular rhythm.     Heart sounds: Murmur heard.  High-pitched blowing decrescendo early diastolic murmur is present with a grade of 1/4 at the upper right sternal border radiating to the apex.  Pulmonary:     Effort: Pulmonary effort is normal.     Breath sounds: Normal breath sounds. No wheezing or rales.  Musculoskeletal:     Right lower leg: No edema.     Left lower leg: No edema.        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 Orvan Seen with Hemashield platinum double velous vascular grafr 32/X30 mm, post-op Afib, provoked DVT due to  decreased mobility  Thoracic aorta aneurysm: Now s/p repair after aortic dissection 05/2020. Genetic testing yielded no variant associated with familial aortopathy. Rested well chronic dissection extending to right common carotid and right subclavian artery, along with small area of aneurysmal descending aorta.  Continue blood pressure and heart rate control. See below.  Hypertension: Now well controlled.  Paroxysmal A. Fib: CHA2DS2VASc score 2, annual stroke risk 2% While patient was not initially on Eliquis, he is now on Eliquis for DVT anyway. Given that his stroke risk will only increase with increasing age, will continue Eliquis 5 mg daily.  DVT: H/o right gastrocnemius DVT, likely related to immobility post surgery. Remains on Eliquis given paroxysmal A. Fib.  F/u in 6 months   Nigel Mormon, MD Pager: [phone] Office: [phone]

## 2021-12-12 ENCOUNTER — Encounter: Payer: Self-pay | Admitting: Cardiology

## 2021-12-12 ENCOUNTER — Ambulatory Visit: Payer: Medicare Other | Admitting: Cardiology

## 2021-12-12 VITALS — BP 128/78 | HR 66 | Temp 98.0°F | Resp 16 | Ht 70.0 in | Wt 175.0 lb

## 2021-12-12 DIAGNOSIS — I10 Essential (primary) hypertension: Secondary | ICD-10-CM

## 2021-12-12 DIAGNOSIS — Z9889 Other specified postprocedural states: Secondary | ICD-10-CM

## 2021-12-12 DIAGNOSIS — I480 Paroxysmal atrial fibrillation: Secondary | ICD-10-CM

## 2021-12-31 ENCOUNTER — Encounter: Payer: Self-pay | Admitting: Cardiothoracic Surgery

## 2021-12-31 ENCOUNTER — Ambulatory Visit: Payer: Medicare Other | Admitting: Cardiothoracic Surgery

## 2021-12-31 ENCOUNTER — Ambulatory Visit
Admission: RE | Admit: 2021-12-31 | Discharge: 2021-12-31 | Disposition: A | Discharge status: Home / Self Care | Payer: Medicare Other | Source: Ambulatory Visit | Attending: Cardiothoracic Surgery | Admitting: Cardiothoracic Surgery

## 2021-12-31 VITALS — BP 144/89 | HR 64 | Resp 20 | Ht 70.0 in | Wt 175.5 lb

## 2021-12-31 DIAGNOSIS — L905 Scar conditions and fibrosis of skin: Secondary | ICD-10-CM | POA: Diagnosis not present

## 2021-12-31 DIAGNOSIS — I7101 Dissection of ascending aorta: Secondary | ICD-10-CM

## 2021-12-31 DIAGNOSIS — Z9889 Other specified postprocedural states: Secondary | ICD-10-CM

## 2021-12-31 MED ORDER — IOPAMIDOL (ISOVUE-370) INJECTION 76%
75.0000 mL | Freq: Once | INTRAVENOUS | Status: AC | PRN
Start: 2021-12-31 — End: 2021-12-31
  Administered 2021-12-31: 75 mL via INTRAVENOUS

## 2021-12-31 NOTE — Progress Notes (Signed)
HPI: The patient returns for follow-up with 1 year surveillance CTA following repair of a type I aortic dissection November 2021 by Dr. Orvan Seen. The patient had a superficial lower sternal wound infection which healed with wound VAC and is now completely scarred over. 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  He measures his blood pressure at home and is compliant with his blood pressure medications.  He denies any chest pain or discomfort except with certain upper body exercising at which point he backs off.  The patient is being followed by cardiology for atrial fibrillation and has an echocardiogram scheduled later this summer.  He is compliant with his blood pressure medication including Norvasc, losartan and metoprolol succinate.  His CTA today shows an intact ascending graft with some persistence of the false lumen in the aortic arch with extension into the head vessels but with the true lumen providing majority of the flow.  On each side.  The false lumen extends into the midportion of the descending thoracic aorta.  The diameter of the aorta remains 4.8 cm.  The scan today appears to show some healing since the one a year ago.  Formal report is pending. Current Outpatient Medications  Medication Sig Dispense Refill   amLODipine (NORVASC) 10 MG tablet TAKE 1 TABLET BY MOUTH DAILY 90 tablet 1   atorvastatin (LIPITOR) 10 MG tablet TAKE 1 TABLET BY NOUTH DAILY 90 tablet 2   ELIQUIS 5 MG TABS tablet TAKE 2 TABLETS TWICE DAILY FOR 7 DAYS, THEN SWITCH TO 1 TABLET TWICE DAILY 60 tablet 3   ferrous sulfate 325 (65 FE) MG tablet TAKE 1 TABLET BY MOUTH EVERY DAY WITH BREAKFAST 90 tablet 2   losartan-hydrochlorothiazide (HYZAAR) 50-12.5 MG tablet TAKE 1 TABLET BY MOUTH EVERY DAY 90 tablet 1   metoprolol succinate (TOPROL-XL) 100 MG 24 hr tablet TAKE 1 TABLET BY MOUTH DAILY. TAKE WITH OR  IMMEDIATELY FOLLOWING A MEAL. 90 tablet 3   Multiple Vitamin (MULTIVITAMIN) tablet Take 1 tablet by mouth daily. Alive     oxcarbazepine (TRILEPTAL) 600 MG tablet Take 600 mg by mouth 2 (two) times daily.     tamsulosin (FLOMAX) 0.4 MG CAPS capsule TAKE 1 CAPSULE BY MOUTH EVERY DAY 90 capsule 1   No current facility-administered medications for this visit.     Physical Exam:  Blood pressure (!) 144/89, pulse 64, resp. rate 20, height '5\' 10"'$  (1.778 m), weight 175 lb 8 oz (79.6 kg), SpO2 99 %.  Alert and comfortable Good pulses in all extremities Lungs clear Heart rhythm regular without murmur or gallop Abdomen nontender Extremities without edema  Diagnostic Tests: Today's CTA images personally reviewed as noted above.  Basically stable with the scan a year ago.  Impression: Patient doing well after emergency room repair of type a ascending dissection.  Today's CTA demonstrates persistent false lumen in the arch extending into the proximal head vessels but with the true lumen providing majority of the flow.  Plan: Continue with blood pressure monitoring and control, continue with avoidance of heavy lifting.  Continue with annual surveillance CTA.   Dahlia Byes, MD [facility] [phone]

## 2022-01-14 IMAGING — CT CT ANGIO CHEST
3 of 8 series · 15 of 36 positions shown · non-contrast
Comparison: May 29, 2020.

CLINICAL DATA: History of aortic dissection.

EXAM:
CT ANGIOGRAPHY CHEST, ABDOMEN AND PELVIS
TECHNIQUE: Non-contrast CT of the chest was initially obtained.

[Series 10: cta cap 2.00 bv36 s3 axial arterial · axial · arterial · 0.74mm/px · z∈[+1343,+1899]mm · 10 of 340 slices shown]
[im 31/340  lung]
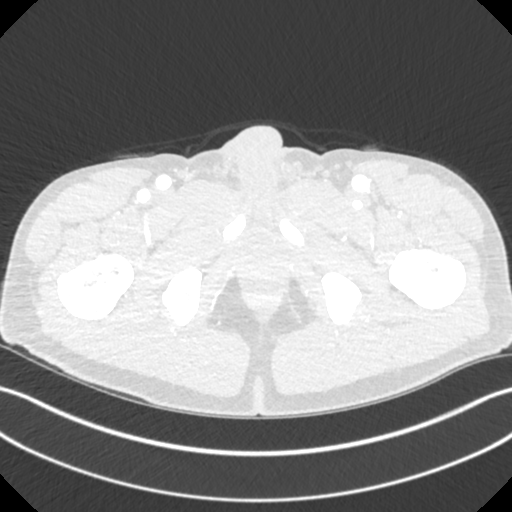
[im 62/340  mediastinal]
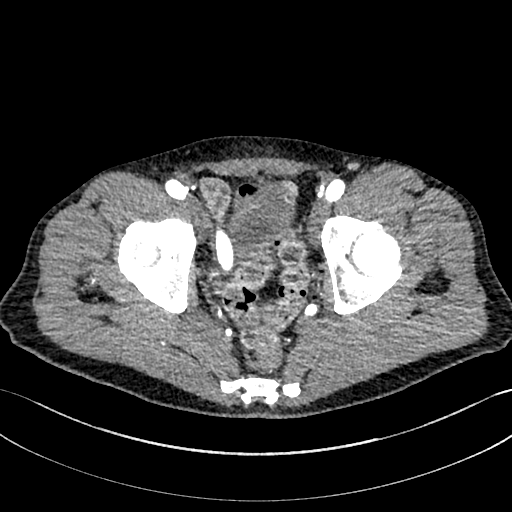
[im 93/340  lung]
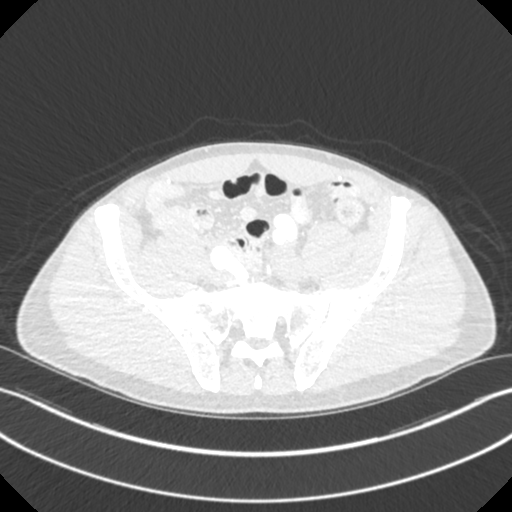
[im 124/340  mediastinal]
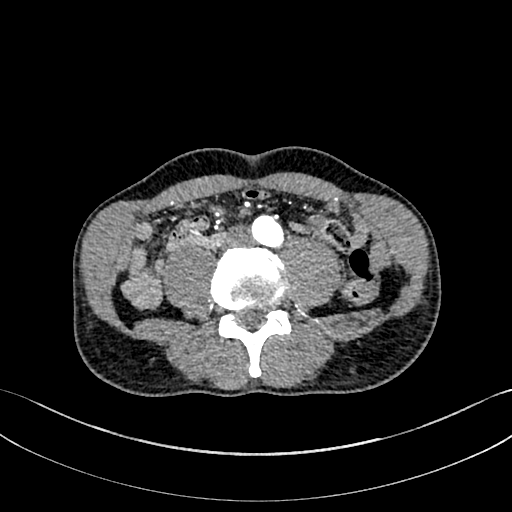
[im 155/340  lung]
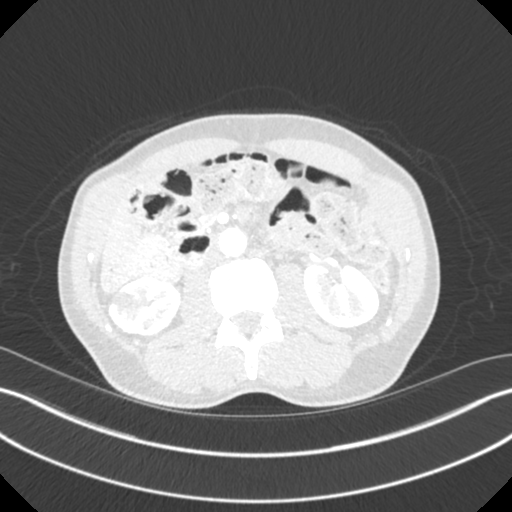
[im 185/340  mediastinal]
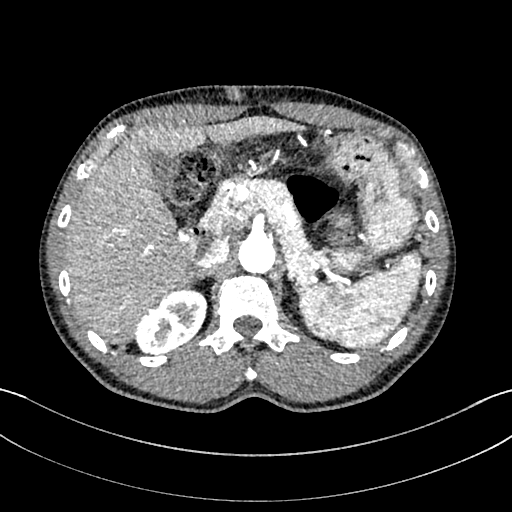
[im 216/340  lung]
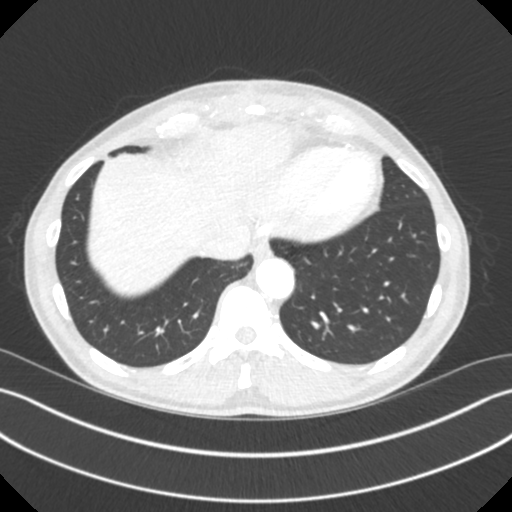
[im 247/340  mediastinal]
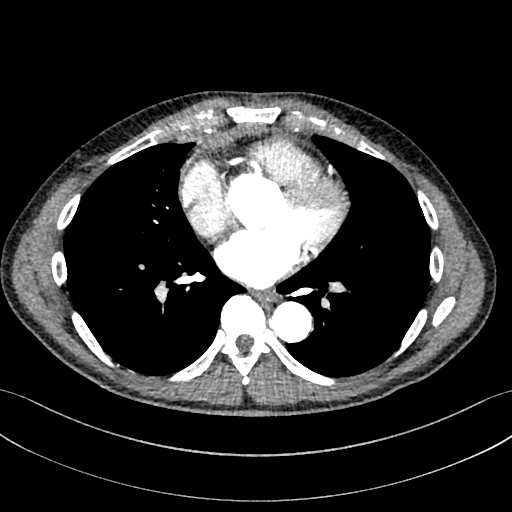
[im 278/340  lung]
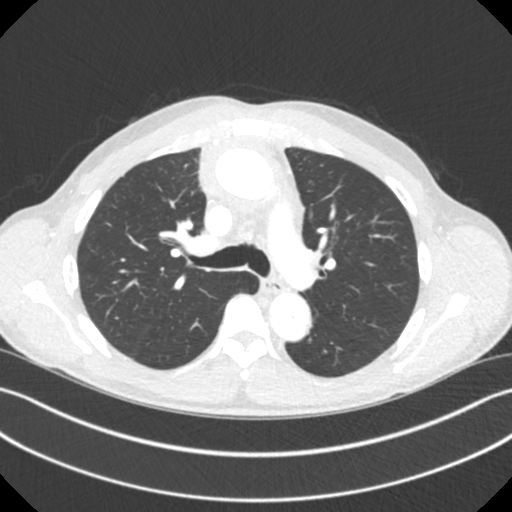
[im 309/340  mediastinal]
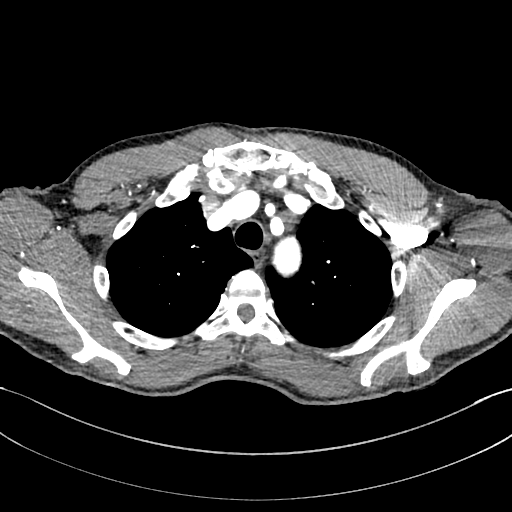

[Series 13: cta cap 2.00 bv36 s3 cor st · coronal · 0.74mm/px · 1 of 189 slices shown]
[im 95/189  mediastinal]
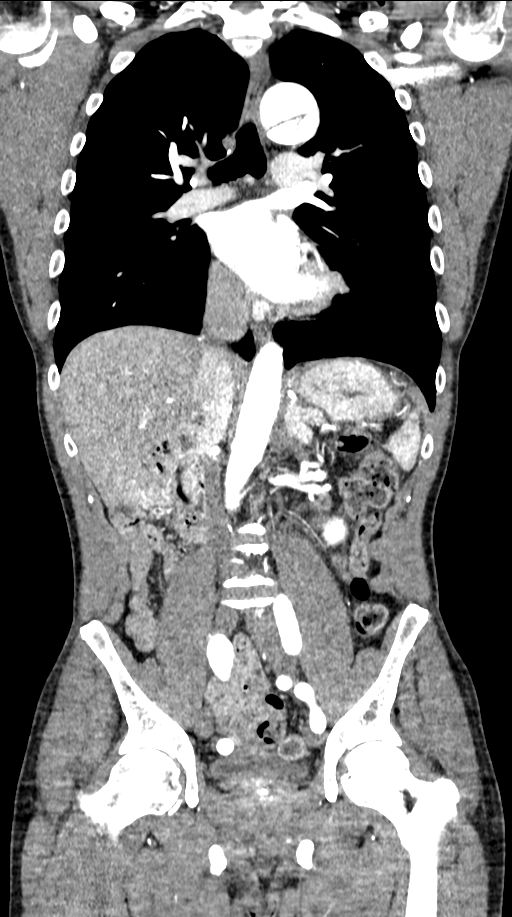

[Series 21: cta cap 2.00 br60 s3 lung arterial · axial · arterial · 0.56mm/px · z∈[+1681,+1891]mm · 4 of 175 slices shown]
[im 35/175  mediastinal]
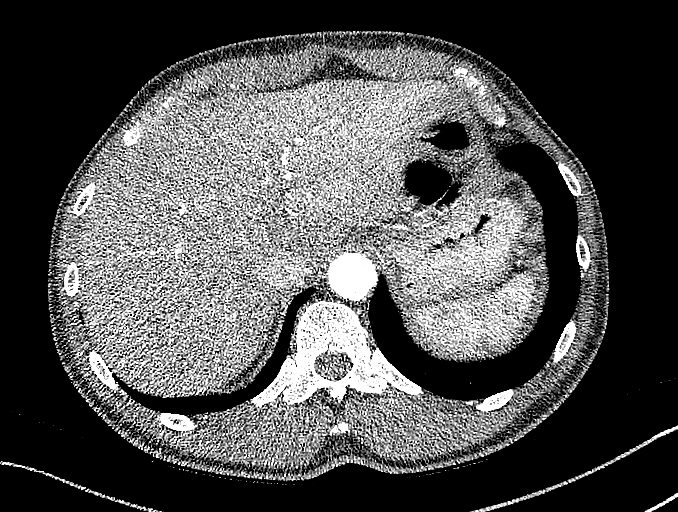
[im 70/175  mediastinal]
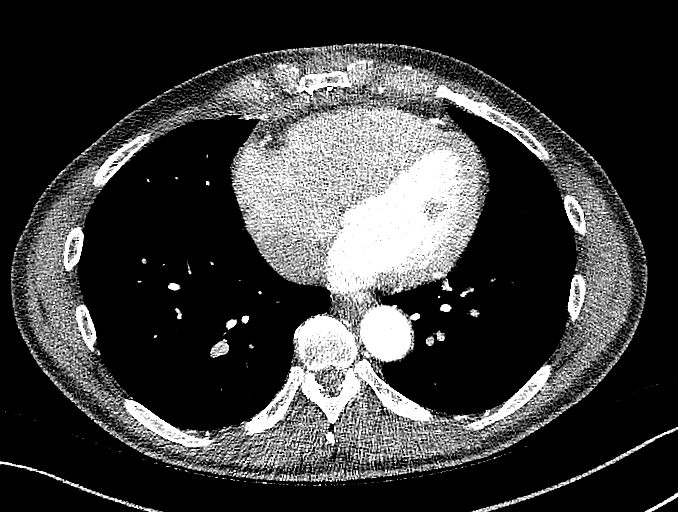
[im 105/175  mediastinal]
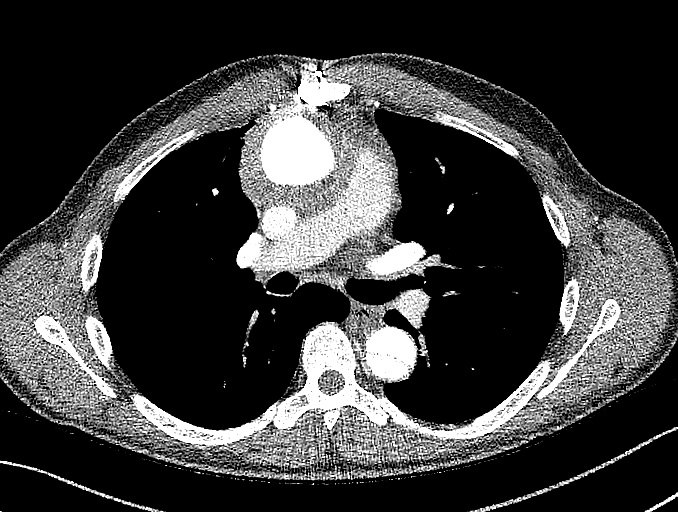
[im 140/175  mediastinal]
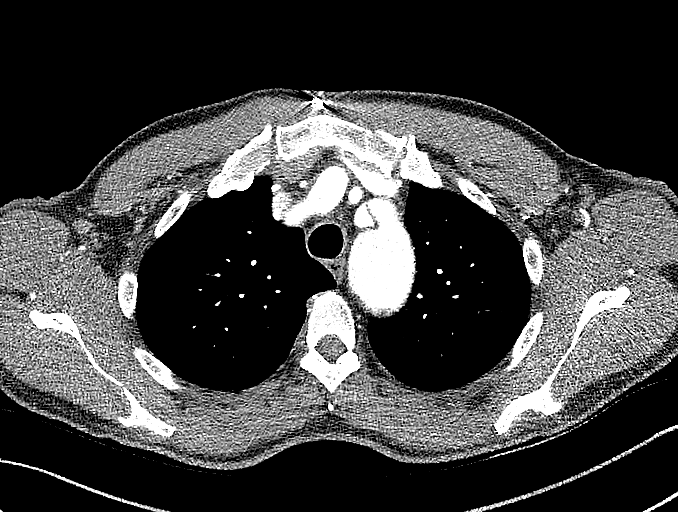

[15 of 36 positions shown; findings below may reference images not displayed]

Multidetector CT imaging through the chest, abdomen and pelvis was
performed using the standard protocol during bolus administration of
intravenous contrast. Multiplanar reconstructed images and MIPs were
obtained and reviewed to evaluate the vascular anatomy.

CONTRAST:  75mL [id number] IOPAMIDOL ([id number]) INJECTION 76%
FINDINGS: CTA CHEST FINDINGS

Cardiovascular: Status post surgical repair of ascending thoracic
aortic dissection. Residual dissection remains which begins at
approximately the origin of the right innominate artery and extends
into the proximal descending thoracic aorta. Dissection flap is seen
extending into the right innominate artery as well as into the
proximal right common carotid artery. The left common carotid and
subclavian arteries are eyes from the true lumen. Normal cardiac
size. No pericardial effusion. Maximum aneurysmal dilatation of
proximal descending thoracic aorta is 4.7 cm.

Mediastinum/Nodes: No enlarged mediastinal, hilar, or axillary lymph
nodes. Thyroid gland, trachea, and esophagus demonstrate no
significant findings.

Lungs/Pleura: Lungs are clear. No pleural effusion or pneumothorax.

Musculoskeletal: No chest wall abnormality. No acute or significant
osseous findings.

Review of the MIP images confirms the above findings.

CTA ABDOMEN AND PELVIS FINDINGS

VASCULAR

Aorta: Normal caliber aorta without aneurysm, dissection, vasculitis
or significant stenosis.

Celiac: Patent without evidence of aneurysm, dissection, vasculitis
or significant stenosis.

SMA: Patent without evidence of aneurysm, dissection, vasculitis or
significant stenosis.

Renals: Both renal arteries are patent without evidence of aneurysm,
dissection, vasculitis, fibromuscular dysplasia or significant
stenosis.

IMA: Patent without evidence of aneurysm, dissection, vasculitis or
significant stenosis.

Inflow: Patent without evidence of aneurysm, dissection, vasculitis
or significant stenosis.

Veins: No obvious venous abnormality within the limitations of this
arterial phase study.

Review of the MIP images confirms the above findings.

NON-VASCULAR

Hepatobiliary: No focal liver abnormality is seen. No gallstones,
gallbladder wall thickening, or biliary dilatation.

Pancreas: Unremarkable. No pancreatic ductal dilatation or
surrounding inflammatory changes.

Spleen: Normal in size without focal abnormality.

Adrenals/Urinary Tract: Adrenal glands are unremarkable. Kidneys are
normal, without renal calculi, focal lesion, or hydronephrosis.
Bladder is unremarkable.

Stomach/Bowel: The stomach appears normal. There is no evidence of
bowel obstruction or inflammation.

Lymphatic: No adenopathy is noted.

Reproductive: Prostate is unremarkable.

Other: No abdominal wall hernia or abnormality. No abdominopelvic
ascites.

Musculoskeletal: No acute or significant osseous findings.

Review of the MIP images confirms the above findings.
IMPRESSION: Status post surgical repair of ascending thoracic aortic dissection.
Residual dissection is seen involving the transverse aortic arch and
proximal descending thoracic aorta, with maximum aneurysmal
dilatation of proximal descending thoracic aorta at 4.7 cm.

Dissection flap is seen extending into the right subclavian artery
and proximal right common carotid artery.

No definite abnormality is noted in the abdomen or pelvis.

## 2022-01-16 ENCOUNTER — Other Ambulatory Visit: Payer: Self-pay | Admitting: Family Medicine

## 2022-01-16 DIAGNOSIS — E785 Hyperlipidemia, unspecified: Secondary | ICD-10-CM

## 2022-02-10 ENCOUNTER — Other Ambulatory Visit: Payer: Self-pay | Admitting: Family Medicine

## 2022-02-10 DIAGNOSIS — Z125 Encounter for screening for malignant neoplasm of prostate: Secondary | ICD-10-CM

## 2022-02-10 DIAGNOSIS — D649 Anemia, unspecified: Secondary | ICD-10-CM

## 2022-02-10 DIAGNOSIS — I10 Essential (primary) hypertension: Secondary | ICD-10-CM

## 2022-02-14 ENCOUNTER — Other Ambulatory Visit (INDEPENDENT_AMBULATORY_CARE_PROVIDER_SITE_OTHER): Payer: Medicare Other

## 2022-02-14 DIAGNOSIS — I10 Essential (primary) hypertension: Secondary | ICD-10-CM

## 2022-02-14 DIAGNOSIS — Z125 Encounter for screening for malignant neoplasm of prostate: Secondary | ICD-10-CM

## 2022-02-14 DIAGNOSIS — D649 Anemia, unspecified: Secondary | ICD-10-CM

## 2022-02-14 LAB — CBC WITH DIFFERENTIAL/PLATELET
Basophils Absolute: 0 10*3/uL (ref 0.0–0.1)
Basophils Relative: 0.7 % (ref 0.0–3.0)
Eosinophils Absolute: 0.1 10*3/uL (ref 0.0–0.7)
Eosinophils Relative: 2.6 % (ref 0.0–5.0)
HCT: 36.6 % — ABNORMAL LOW (ref 39.0–52.0)
Hemoglobin: 12.1 g/dL — ABNORMAL LOW (ref 13.0–17.0)
Lymphocytes Relative: 28.2 % (ref 12.0–46.0)
Lymphs Abs: 1 10*3/uL (ref 0.7–4.0)
MCHC: 33 g/dL (ref 30.0–36.0)
MCV: 88.5 fl (ref 78.0–100.0)
Monocytes Absolute: 0.6 10*3/uL (ref 0.1–1.0)
Monocytes Relative: 18 % — ABNORMAL HIGH (ref 3.0–12.0)
Neutro Abs: 1.7 10*3/uL (ref 1.4–7.7)
Neutrophils Relative %: 50.5 % (ref 43.0–77.0)
Platelets: 196 10*3/uL (ref 150.0–400.0)
RBC: 4.13 Mil/uL — ABNORMAL LOW (ref 4.22–5.81)
RDW: 13.5 % (ref 11.5–15.5)
WBC: 3.4 10*3/uL — ABNORMAL LOW (ref 4.0–10.5)

## 2022-02-14 LAB — COMPREHENSIVE METABOLIC PANEL
ALT: 26 U/L (ref 0–53)
AST: 21 U/L (ref 0–37)
Albumin: 3.9 g/dL (ref 3.5–5.2)
Alkaline Phosphatase: 85 U/L (ref 39–117)
BUN: 17 mg/dL (ref 6–23)
CO2: 28 mEq/L (ref 19–32)
Calcium: 9.2 mg/dL (ref 8.4–10.5)
Chloride: 101 mEq/L (ref 96–112)
Creatinine, Ser: 1.14 mg/dL (ref 0.40–1.50)
GFR: 64.27 mL/min (ref >60.00)
Glucose, Bld: 91 mg/dL (ref 70–99)
Potassium: 4 mEq/L (ref 3.5–5.1)
Sodium: 137 mEq/L (ref 135–145)
Total Bilirubin: 0.4 mg/dL (ref 0.2–1.2)
Total Protein: 7.4 g/dL (ref 6.0–8.3)

## 2022-02-14 LAB — LIPID PANEL
Cholesterol: 137 mg/dL (ref 0–200)
HDL: 63.2 mg/dL (ref >39.00)
LDL Cholesterol: 61 mg/dL (ref 0–99)
NonHDL: 73.3
Total CHOL/HDL Ratio: 2
Triglycerides: 60 mg/dL (ref 0.0–149.0)
VLDL: 12 mg/dL (ref 0.0–40.0)

## 2022-02-14 LAB — IRON: Iron: 72 ug/dL (ref 42–165)

## 2022-02-14 LAB — TSH: TSH: 1.22 u[IU]/mL (ref 0.35–5.50)

## 2022-02-14 LAB — PSA, MEDICARE: PSA: 1.21 ng/ml (ref 0.10–4.00)

## 2022-02-18 ENCOUNTER — Ambulatory Visit: Payer: Medicare Other

## 2022-02-20 ENCOUNTER — Other Ambulatory Visit: Payer: Self-pay | Admitting: Cardiology

## 2022-02-20 ENCOUNTER — Telehealth: Payer: Self-pay | Admitting: Family Medicine

## 2022-02-20 ENCOUNTER — Ambulatory Visit: Payer: Medicare Other

## 2022-02-20 DIAGNOSIS — I10 Essential (primary) hypertension: Secondary | ICD-10-CM

## 2022-02-20 NOTE — Telephone Encounter (Signed)
patient called at 12:30 and said he has been waiting on a call since 10:00 today and hasnt heard anything. He would like a call back to reschedule his AWV

## 2022-02-21 ENCOUNTER — Ambulatory Visit (INDEPENDENT_AMBULATORY_CARE_PROVIDER_SITE_OTHER): Payer: Medicare Other | Admitting: Family Medicine

## 2022-02-21 ENCOUNTER — Encounter: Payer: Self-pay | Admitting: Family Medicine

## 2022-02-21 VITALS — BP 120/76 | HR 64 | Temp 97.2°F | Ht 70.0 in | Wt 174.0 lb

## 2022-02-21 DIAGNOSIS — I82509 Chronic embolism and thrombosis of unspecified deep veins of unspecified lower extremity: Secondary | ICD-10-CM

## 2022-02-21 DIAGNOSIS — Z9889 Other specified postprocedural states: Secondary | ICD-10-CM

## 2022-02-21 DIAGNOSIS — I10 Essential (primary) hypertension: Secondary | ICD-10-CM

## 2022-02-21 DIAGNOSIS — Z0000 Encounter for general adult medical examination without abnormal findings: Secondary | ICD-10-CM | POA: Diagnosis not present

## 2022-02-21 DIAGNOSIS — E785 Hyperlipidemia, unspecified: Secondary | ICD-10-CM

## 2022-02-21 DIAGNOSIS — Z7189 Other specified counseling: Secondary | ICD-10-CM

## 2022-02-21 DIAGNOSIS — I71019 Dissection of thoracic aorta, unspecified: Secondary | ICD-10-CM

## 2022-02-21 DIAGNOSIS — R569 Unspecified convulsions: Secondary | ICD-10-CM

## 2022-02-21 MED ORDER — ATORVASTATIN CALCIUM 10 MG PO TABS: 10.0000 mg | ORAL_TABLET | Freq: Every day | ORAL | 3 refills | Status: DC

## 2022-02-21 MED ORDER — AMLODIPINE BESYLATE 10 MG PO TABS: 10.0000 mg | ORAL_TABLET | Freq: Every day | ORAL | 3 refills | Status: DC

## 2022-02-21 MED ORDER — LOSARTAN POTASSIUM-HCTZ 50-12.5 MG PO TABS: 1.0000 | ORAL_TABLET | Freq: Every day | ORAL | 3 refills | Status: DC

## 2022-02-21 MED ORDER — TAMSULOSIN HCL 0.4 MG PO CAPS
0.4000 mg | ORAL_CAPSULE | Freq: Every day | ORAL | 3 refills | Status: DC
Start: 2022-02-21 — End: 2023-02-25

## 2022-02-21 MED ORDER — APIXABAN 5 MG PO TABS: 5.0000 mg | ORAL_TABLET | Freq: Two times a day (BID) | ORAL | 3 refills | Status: DC

## 2022-02-21 MED ORDER — METOPROLOL SUCCINATE ER 100 MG PO TB24: 100.0000 mg | ORAL_TABLET | Freq: Every day | ORAL | 3 refills | Status: DC

## 2022-02-21 NOTE — Progress Notes (Signed)
I have personally reviewed the Medicare Annual Wellness questionnaire and have noted 1. The patient's medical and social history 2. Their use of alcohol, tobacco or illicit drugs 3. Their current medications and supplements 4. The patient's functional ability including ADL's, fall risks, home safety risks and hearing or visual             impairment. 5. Diet and physical activities 6. Evidence for depression or mood disorders  The patients weight, height, BMI have been recorded in the chart and visual acuity is per eye clinic.  I have made referrals, counseling and provided education to the patient based review of the above and I have provided the pt with a written personalized care plan for preventive services.  Provider list updated- see scanned forms.  Routine anticipatory guidance given to patient.  See health maintenance. The possibility exists that previously documented standard health maintenance information may have been brought forward from a previous encounter into this note.  If needed, that same information has been updated to reflect the current situation based on today's encounter.    Flu 2022 Shingles discussed with patient, would defer today. PNA deferred. Tetanus 2017 COVID-vaccine previously done Colon cancer screening deferred 2023 given other issues, see below.  He agrees. Prostate cancer screening 2023. Advance directive discussed with patient.  Wife designated if patient were incapacitated.  Then he would want his children equally designated after that if needed. Cognitive function addressed- see scanned forms- and if abnormal then additional documentation follows.   In addition to [facility] Wellness, follow up visit for the below conditions:  He did not get a phone call to complete his Medicare wellness visit.  I apologized to the patient.  I already check with staff to track this issue back to find out why there was a scheduling error in the first place.  He was  really gracious about it.  Elevated Cholesterol: Using medications without problems: yes Muscle aches: no  Diet compliance: yes Exercise: yes  Hypertension:    Using medication without problems or lightheadedness: yes Chest pain with exertion:see below, one episode. No CP now.   Edema:no Short of breath:no  He has some shoulder pain but not chest pain at the end of a bike ride, about 20 miles in to the ride.  He had pulse elevation at the time.  He didn't feel skipped beats.  He has been out of eliquis, rx sent today.  No bleeding.    He has been seeing neurology.  No SZ.  Compliant.    Aortic hx d/w pt.  I need update from Dr. 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    Prev imaging d/w pt.  Done 12/2021.  IMPRESSION: 1. Stable postoperative appearance of ascending aortic repair. Persistent dissection flap in the arch, right brachiocephalic and proximal common carotid arteries, and proximal descending aorta as before. 2. 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 Recommend semi-annual imaging followup by CTA or MRA and referral to cardiothoracic surgery (already obtained). This recommendation follows 2010 ACCF/AHA/AATS/ACR/ASA/SCA/SCAI/SIR/STS/SVM Guidelines for the Diagnosis and Management of Patients With Thoracic Aortic Disease. Circulation. 2010; 121: e266-e36 3. Dilated common iliac arteries as above.  PMH and SH reviewed  Meds, vitals, and allergies reviewed.   ROS: Per HPI.  Unless specifically indicated otherwise in HPI, the patient denies:  General: fever. Eyes: acute vision changes  ENT: sore throat Cardiovascular: chest pain Respiratory: SOB GI: vomiting GU: dysuria Musculoskeletal: acute back pain Derm: acute rash Neuro: acute motor dysfunction Psych: worsening mood Endocrine:  polydipsia Heme: bleeding Allergy: hayfever  GEN: nad, alert and oriented HEENT: ncat NECK: supple w/o LA CV: rrr. PULM: ctab, no inc wob ABD: soft, +bs EXT: no edema SKIN: no acute rash Well-healed midline sternotomy scar noted.

## 2022-02-21 NOTE — Patient Instructions (Addendum)
Let me check with cardiac surgery and we'll be in touch.   Take care.  Glad to see you. Limit exercise in the meantime.

## 2022-02-22 ENCOUNTER — Telehealth: Payer: Self-pay | Admitting: Family Medicine

## 2022-02-22 ENCOUNTER — Other Ambulatory Visit: Payer: Self-pay | Admitting: Cardiology

## 2022-02-22 DIAGNOSIS — I480 Paroxysmal atrial fibrillation: Secondary | ICD-10-CM

## 2022-02-22 NOTE — Assessment & Plan Note (Signed)
Continue atorvastatin.  Labs discussed with patient.

## 2022-02-22 NOTE — Assessment & Plan Note (Signed)
Advance directive discussed with patient.  Wife designated if patient were incapacitated.  Then he would want his children equally designated after that if needed.

## 2022-02-22 NOTE — Assessment & Plan Note (Signed)
Discussed with patient about most recent imaging.  1. Stable postoperative appearance of ascending aortic repair. Persistent dissection flap in the arch, right brachiocephalic and proximal common carotid arteries, and proximal descending aorta as before. 2. 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 Recommend semi-annual imaging followup by CTA or MRA and referral to cardiothoracic surgery (already obtained).  He had specific questions about the proximal descending thoracic aorta and I am going to ask for input from vascular surgery about follow-up regarding this.

## 2022-02-22 NOTE — Telephone Encounter (Signed)
I sent you both a copy of my office visit note.  I need input from both of you.  I thank you for your help.  I need update from Dr. 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  I wanted to make sure about his follow-up imaging scheduled.  If I need to order this then please let me know.  The patient had an episode of arm pain and tachycardia near the end of a bike ride.  He was not having symptoms when I saw him in clinic.  He had been out of his Eliquis so I refilled that in the meantime.  I did not change his medications otherwise.  He has a high level of fitness and really enjoys exercising.  I asked him to refrain from exercise in the meantime.  I need your input about evaluation going forward, specifically if he needed ambulatory monitoring to recheck his A-fib burden, especially in the setting of exercise.  Thanks.

## 2022-02-22 NOTE — Telephone Encounter (Signed)
Thank you for your message. Looks like he has an appt with me on 8/17. I will arrange outpatient cardiac telemetry even before then. I am aware he also saw Dr. Prescott Gum recently. Look forward to his input re: TAA  Thanks MJP

## 2022-02-22 NOTE — Assessment & Plan Note (Signed)
History of, compliant with Trileptal.  Per neurology.  I will defer.

## 2022-02-22 NOTE — Assessment & Plan Note (Addendum)
Continue amlodipine losartan hydrochlorothiazide metoprolol.  Labs discussed with patient.  I asked him to limit exercise in the meantime.  He had an episode of the shoulder but not chest pain at the end of the bike ride.  It is atypical for him to need to stop a bike ride short.  He had a pulse elevation at the time and it is unclear if that was exertional or related to A-fib.  He has been out of Eliquis so I preemptively refilled that today and I am going to ask for cardiology input.  At this point still okay for outpatient follow-up.

## 2022-02-22 NOTE — Assessment & Plan Note (Signed)
Flu 2022 Shingles discussed with patient, would defer today. PNA deferred. Tetanus 2017 COVID-vaccine previously done Colon cancer screening deferred 2023 given other issues, see below.  He agrees. Prostate cancer screening 2023. Advance directive discussed with patient.  Wife designated if patient were incapacitated.  Then he would want his children equally designated after that if needed. Cognitive function addressed- see scanned forms- and if abnormal then additional documentation follows.

## 2022-02-25 ENCOUNTER — Other Ambulatory Visit: Payer: Medicare Other

## 2022-02-25 ENCOUNTER — Inpatient Hospital Stay: Payer: Medicare Other

## 2022-02-25 ENCOUNTER — Encounter: Payer: Self-pay | Admitting: Cardiology

## 2022-02-25 DIAGNOSIS — I480 Paroxysmal atrial fibrillation: Secondary | ICD-10-CM

## 2022-02-25 NOTE — Telephone Encounter (Signed)
Let us do 14 day live monitor. Can keep appt on 17th, as I will be able to see live monitor real time.  Thanks MJP

## 2022-02-25 NOTE — Telephone Encounter (Signed)
From patient. Its for 14 days instead I can clarify that with patient but do you want him to push appt out till monitor results are back

## 2022-02-26 NOTE — Telephone Encounter (Signed)
Noted and appreciate input from Dr. Lawson Fiscal.  Per his rec, "Because the patient has already had surgery a surveillance scan is needed just once a year."  I will update patient.

## 2022-02-27 ENCOUNTER — Ambulatory Visit: Payer: Medicare Other

## 2022-02-27 ENCOUNTER — Inpatient Hospital Stay: Payer: Medicare Other

## 2022-02-27 DIAGNOSIS — Z9889 Other specified postprocedural states: Secondary | ICD-10-CM

## 2022-03-06 ENCOUNTER — Other Ambulatory Visit: Payer: Self-pay | Admitting: Family Medicine

## 2022-03-06 DIAGNOSIS — D649 Anemia, unspecified: Secondary | ICD-10-CM

## 2022-03-07 ENCOUNTER — Ambulatory Visit: Payer: Medicare Other | Admitting: Cardiology

## 2022-03-07 ENCOUNTER — Encounter: Payer: Self-pay | Admitting: Cardiology

## 2022-03-07 VITALS — BP 129/81 | HR 60 | Temp 98.0°F | Resp 16 | Ht 70.0 in | Wt 176.0 lb

## 2022-03-07 DIAGNOSIS — I209 Angina pectoris, unspecified: Secondary | ICD-10-CM

## 2022-03-07 NOTE — Progress Notes (Signed)
Patient referred by Tonia Ghent, MD for aortic dissection  Subjective:   Juan Wagner, male    DOB: [birth year], 71 y.o.   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  He was taking a recruitment class and was doing active exercises such as the BX and push-ups, when he had sudden onset chest pain and near syncope.  He was rushed to emergency room at [facility] where he was found to have aortic dissection on work-up, details below.  He underwent emergent repair of ascending aorta aneurysm by Dr. Orvan Seen.  Postoperative stay was largely uneventful other than brief postop A. Fib.  Update (03/07/2022): Patient recently had an episode of the following. He had been doing her usual long distance bike rides. After finishing, he felt his heart was racing little over 1000 bpm, which is unusual for him. He also had a right sided chest pain, which resolved over the next 10-15 min after resting. He has not had any recurrence of similar symptoms since, then. He has not done any rigorous exercise. Patient is currently wearing cardiac monitor, which has not shown any significant abnormality yet.  I reviewed the echocardiogram results after his tests, details below.    Current Outpatient Medications:    amLODipine (NORVASC) 10 MG tablet, Take 1 tablet (10 mg total) by mouth daily., Disp: 90 tablet, Rfl: 3   apixaban (ELIQUIS) 5 MG TABS tablet, Take 1 tablet (5 mg total) by mouth 2 (two) times daily., Disp: 180 tablet, Rfl: 3   atorvastatin (LIPITOR) 10 MG tablet, Take 1 tablet (10 mg total) by mouth daily., Disp: 90 tablet, Rfl: 3   ferrous  sulfate 325 (65 FE) MG tablet, TAKE 1 TABLET BY MOUTH EVERY DAY WITH BREAKFAST, Disp: 90 tablet, Rfl: 2   losartan-hydrochlorothiazide (HYZAAR) 50-12.5 MG tablet, Take 1 tablet by mouth daily., Disp: 90 tablet, Rfl: 3   metoprolol succinate (TOPROL-XL) 100 MG 24 hr tablet, Take 1 tablet (100 mg total) by mouth daily. TAKE WITH OR IMMEDIATELY FOLLOWING A MEAL., Disp: 90 tablet, Rfl: 3   Multiple Vitamin (MULTIVITAMIN) tablet, Take 1 tablet by mouth daily. Alive, Disp: , Rfl:    oxcarbazepine (TRILEPTAL) 600 MG tablet, Take 600 mg by mouth 2 (two) times daily., Disp: , Rfl:    tamsulosin (FLOMAX) 0.4 MG CAPS capsule, Take 1 capsule (0.4 mg total) by mouth daily., Disp: 90 capsule, Rfl: 3  Cardiovascular and other pertinent studies:  Echocardiogram 02/27/2022:  Normal LV systolic function with visual EF 55-60%. Left ventricle cavity  is normal in size. Mild concentric hypertrophy of the left ventricle.  Normal global wall motion. Doppler evidence of grade I (impaired)  diastolic dysfunction, normal LAP. Calculated EF 54%.  Right atrial cavity is mild to moderately dilated.  Right ventricle cavity is moderately dilated. Moderately reduced right  ventricular function.  Structurally normal trileaflet aortic valve.  Mild (Grade I) aortic  regurgitation.  I personally reviewed the echocardiogram. RV is difficult to visualize. I do not think there is any significant RV systolic dysfunction.  CT Chest/abdomen 12/31/2021: 1. Stable postoperative appearance of ascending aortic  repair. Persistent dissection flap in the arch, right brachiocephalic and proximal common carotid arteries, and proximal descending aorta as before. 2. 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 Recommend semi-annual imaging followup by CTA or MRA and referral to cardiothoracic surgery (already obtained). This recommendation follows 2010  ACCF/AHA/AATS/ACR/ASA/SCA/SCAI/SIR/STS/SVM Guidelines for the Diagnosis and Management of Patients With Thoracic Aortic Disease. Circulation. 2010; 121: e266-e36 3. Dilated common iliac arteries as above.  EKG 12/12/2021: Sinus rhythm 76 bpm First degree A-V block Left axis -anterior fascicular block  Voltage criteria for LVH  Anteroseptal infarct -age undetermined   Echocardiogram 02/12/2021:  Normal LV systolic function with EF 56%. Left ventricle cavity is normal  in size. Mild concentric remodeling of the left ventricle. Normal global  wall motion. Normal diastolic filling pattern. Calculated EF 56%.  Right atrial cavity is moderately dilated.  Right ventricle cavity is normal in size. Moderately reduced right  ventricular function.  Trileaflet aortic valve. No evidence of aortic stenosis. Mild (Grade I)  aortic regurgitation.  Trace tricuspid regurgitation. No evidence of tricuspid valve stenosis. No  evidence of pulmonary hypertension.  The aortic root is mildly dilated at 4.2 cm.  Compared to the echocardiogram done on 05/29/2020, ascending aortic  aneurysm measuring 4.8 cm is not present and there is no evidence of  aortic dissection flap.  Patient is S/P ascending aortic aneurysm repair. Vascular US 06/12/2020: RIGHT:  - Findings consistent with acute deep vein thrombosis involving the right gastrocnemius veins.  - There is no evidence of superficial venous thrombosis.     Op note 05/29/2020 (Dr. Orvan Seen): REPAIR OF ACUTE ASCENDING THORACIC AORTIC DISSECTION USING HEMASHIELD PLATINUM WOVEN DOUBLE VELOUR VASCULAR GRAFT 32 MM X 30 CM (N/A) TRANSESOPHAGEAL ECHOCARDIOGRAM (TEE) (N/A)  Recent labs: 02/14/2022: Glucose 91, BUN/Cr 17/1.14. EGFR 64. Na/K 137/4.0. Rest of the CMP normal H/H 12/36. MCV 88. Platelets 196 HbA1C NA Chol 137, TG 60, HDL 63, LDL 61 TSH 1.2 normal  Review of Systems  Cardiovascular:  Positive for chest pain and palpitations. Negative for dyspnea on  exertion, leg swelling and syncope.         Vitals:   03/07/22 1336  BP: 129/81  Pulse: 60  Resp: 16  Temp: 98 F (36.7 C)  SpO2: 97%     Body mass index is 25.25 kg/m. Filed Weights   03/07/22 1336  Weight: 176 lb (79.8 kg)     Objective:   Physical Exam Vitals and nursing note reviewed.  Constitutional:      General: He is not in acute distress. Neck:     Vascular: No JVD.  Cardiovascular:     Rate and Rhythm: Normal rate and regular rhythm.     Heart sounds: Murmur heard.     High-pitched blowing decrescendo early diastolic murmur is present with a grade of 1/4 at the upper right sternal border radiating to the apex.  Pulmonary:     Effort: Pulmonary effort is normal.     Breath sounds: Normal breath sounds. No wheezing or rales.  Musculoskeletal:     Right lower leg: No edema.     Left lower leg: No edema.        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 Orvan Seen with Hemashield platinum double velous vascular grafr 32/X30 mm, post-op Afib, provoked DVT due to decreased mobility  Palpitations, chest  pain: No arrhythmia on current cardiac telemetry yet.  Chest pain needs to be looked at further from ischemia standpoint. Recommend exercise nuclear stress test-with caution not to exceed stage 3 to avoid abrupt hypertension.  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 Genetic testing yielded no variant associated with familial aortopathy. Dissection flap is seen extending into the right subclavian artery and proximal right common carotid artery, stable. Continue blood pressure and heart rate control.  Patient recently had CT chest that showed TAA 5.3 cm, up from 4.7 cm. However, he was evaluated by Dr. Prescott Gum. His comments below.  The repair is intact but there is a persistent false lumen distally, not unusual.  He  does not meet criteria for anymore aortic surgery at this point but does need annual CTA which [facility] office is scheduling. He should not do any heavy lifting which was discussed at my last OV.   Hypertension: Now well controlled.  Paroxysmal A. Fib: CHA2DS2VASc score 2, annual stroke risk 2% While patient was not initially on Eliquis, he is now on Eliquis for DVT anyway. Given that his stroke risk will only increase with increasing age, will continue Eliquis 5 mg daily. Cardiac telemtry ongoing.  DVT: H/o right gastrocnemius DVT, likely related to immobility post surgery. Remains on Eliquis given paroxysmal A. Fib.  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 I told him I'd be honored.   F/u in 3 months   Nigel Mormon, MD Pager: [phone] Office: [phone]

## 2022-03-08 ENCOUNTER — Ambulatory Visit: Payer: Medicare Other | Admitting: Cardiology

## 2022-03-08 NOTE — Telephone Encounter (Signed)
From pt

## 2022-03-11 ENCOUNTER — Encounter: Payer: Self-pay | Admitting: Cardiology

## 2022-03-14 ENCOUNTER — Telehealth: Payer: Self-pay | Admitting: Family Medicine

## 2022-03-14 NOTE — Telephone Encounter (Signed)
Juan Wagner from [facility] called in and was trying to confirm that the patient isn't diabetic and that the A1C was done in July, 2023 and what that number was. He stated that anyone can take the information when they call back with the information. Thank you!

## 2022-03-15 NOTE — Telephone Encounter (Signed)
Called and spoke to someone at [facility]. They were asking if the pt had an A1C on file. I did not see one. Diabetes is not in his problem list.

## 2022-03-26 ENCOUNTER — Ambulatory Visit: Payer: Medicare Other

## 2022-03-26 DIAGNOSIS — I209 Angina pectoris, unspecified: Secondary | ICD-10-CM

## 2022-08-06 ENCOUNTER — Encounter: Payer: Self-pay | Admitting: Gastroenterology

## 2022-08-27 ENCOUNTER — Encounter: Payer: Self-pay | Admitting: Gastroenterology

## 2022-09-02 ENCOUNTER — Ambulatory Visit: Payer: Medicare Other | Admitting: Cardiology

## 2022-09-02 ENCOUNTER — Encounter: Payer: Self-pay | Admitting: Cardiology

## 2022-09-02 VITALS — BP 150/94 | HR 56 | Ht 70.0 in | Wt 181.0 lb

## 2022-09-02 DIAGNOSIS — I351 Nonrheumatic aortic (valve) insufficiency: Secondary | ICD-10-CM

## 2022-09-02 DIAGNOSIS — I480 Paroxysmal atrial fibrillation: Secondary | ICD-10-CM

## 2022-09-02 DIAGNOSIS — I10 Essential (primary) hypertension: Secondary | ICD-10-CM

## 2022-09-02 MED ORDER — LOSARTAN POTASSIUM-HCTZ 100-25 MG PO TABS: 1.0000 | ORAL_TABLET | Freq: Every day | ORAL | 3 refills | Status: DC

## 2022-09-02 NOTE — Progress Notes (Signed)
Patient referred by Tonia Ghent, MD for aortic dissection  Subjective:   Juan Wagner, male    DOB: 1950-12-12, 72 y.o.   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  He has cut down on his running and push-ups etc.  He denies chest pain, shortness of breath, palpitations, leg edema, orthopnea, PND, TIA/syncope.  Blood pressure is elevated today.  It appears that it stays 120-150/80-90 mmHg at home.     Current Outpatient Medications:    amLODipine (NORVASC) 10 MG tablet, Take 1 tablet (10 mg total) by mouth daily., Disp: 90 tablet, Rfl: 3   apixaban (ELIQUIS) 5 MG TABS tablet, Take 1 tablet (5 mg total) by mouth 2 (two) times daily., Disp: 180 tablet, Rfl: 3   atorvastatin (LIPITOR) 10 MG tablet, Take 1 tablet (10 mg total) by mouth daily., Disp: 90 tablet, Rfl: 3   ferrous sulfate 325 (65 FE) MG tablet, TAKE 1 TABLET BY MOUTH EVERY DAY WITH BREAKFAST, Disp: 90 tablet, Rfl: 2   losartan-hydrochlorothiazide (HYZAAR) 50-12.5 MG tablet, Take 1 tablet by mouth daily., Disp: 90 tablet, Rfl: 3   metoprolol succinate (TOPROL-XL) 100 MG 24 hr tablet, Take 1 tablet (100 mg total) by mouth daily. TAKE WITH OR IMMEDIATELY FOLLOWING A MEAL., Disp: 90 tablet, Rfl: 3   Multiple Vitamin (MULTIVITAMIN) tablet, Take 1 tablet by mouth daily. Alive, Disp: , Rfl:    oxcarbazepine (TRILEPTAL) 600 MG tablet, Take 600 mg by mouth 2 (two) times daily., Disp: , Rfl:    tamsulosin (FLOMAX) 0.4 MG CAPS capsule, Take 1 capsule (0.4 mg total) by mouth daily., Disp: 90 capsule, Rfl: 3  Cardiovascular and other pertinent studies:  Mobile cardiac telemetry 14 days 02/27/2022 - 03/13/2022: Dominant rhythm: Sinus. HR  48-137 bpm. Avg HR 68 bpm, in sinus rhythm. 10 episodes of atrial tachycardia, fastest at 129 bpm for 5 beats, longest for 6 beats at 101 bpm.  <1% isolated SVE, couplet/triplets. 0 episodes of VT. <1% isolated VE, couplets. No atrial fibrillation/atrial flutter/VT/high grade AV block, sinus pause >3sec noted. 2 patient triggered events, correlated with sinus rhythm.    Echocardiogram 02/27/2022:  Normal LV systolic function with visual EF 55-60%. Left ventricle cavity  is normal in size. Mild concentric hypertrophy of the left ventricle.  Normal global wall motion. Doppler evidence of grade I (impaired)  diastolic dysfunction, normal LAP. Calculated EF 54%.  Right atrial cavity is mild to moderately dilated.  Right ventricle cavity is moderately dilated. Moderately reduced right  ventricular function.  Structurally normal trileaflet aortic valve.  Mild (Grade I) aortic  regurgitation.  I personally reviewed the echocardiogram. RV is difficult to visualize. I do not think there is any significant RV systolic dysfunction.  CT Chest/abdomen 12/31/2021: 1. Stable postoperative appearance of ascending aortic repair. Persistent dissection flap in the arch, right brachiocephalic and proximal common carotid arteries, and proximal descending aorta as before. 2. 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 Recommend semi-annual imaging followup by CTA or MRA and referral to cardiothoracic surgery (already obtained). This recommendation follows 2010 ACCF/AHA/AATS/ACR/ASA/SCA/SCAI/SIR/STS/SVM Guidelines for the Diagnosis and Management of Patients  With Thoracic Aortic Disease. Circulation. 2010; 121: e266-e36 3. Dilated common iliac arteries as above.  EKG 12/12/2021: Sinus rhythm 76 bpm First degree A-V block Left axis -anterior fascicular block  Voltage criteria for LVH  Anteroseptal infarct -age undetermined    Echocardiogram 02/12/2021:  Normal LV systolic function with EF 56%. Left ventricle cavity is normal  in size. Mild concentric remodeling of the left ventricle. Normal global  wall motion. Normal diastolic filling pattern. Calculated EF 56%.  Right atrial cavity is moderately dilated.  Right ventricle cavity is normal in size. Moderately reduced right  ventricular function.  Trileaflet aortic valve. No evidence of aortic stenosis. Mild (Grade I)  aortic regurgitation.  Trace tricuspid regurgitation. No evidence of tricuspid valve stenosis. No  evidence of pulmonary hypertension.  The aortic root is mildly dilated at 4.2 cm.  Compared to the echocardiogram done on 05/29/2020, ascending aortic  aneurysm measuring 4.8 cm is not present and there is no evidence of  aortic dissection flap.  Patient is S/P ascending aortic aneurysm repair. Vascular US 06/12/2020: RIGHT:  - Findings consistent with acute deep vein thrombosis involving the right gastrocnemius veins.  - There is no evidence of superficial venous thrombosis.     Op note 05/29/2020 (Dr. Orvan Seen): REPAIR OF ACUTE ASCENDING THORACIC AORTIC DISSECTION USING HEMASHIELD PLATINUM WOVEN DOUBLE VELOUR VASCULAR GRAFT 32 MM X 30 CM (N/A) TRANSESOPHAGEAL ECHOCARDIOGRAM (TEE) (N/A)  Recent labs: 02/14/2022: Glucose 91, BUN/Cr 17/1.14. EGFR 64. Na/K 137/4.0. Rest of the CMP normal H/H 12/36. MCV 88. Platelets 196 HbA1C NA Chol 137, TG 60, HDL 63, LDL 61 TSH 1.2 normal  Review of Systems  Cardiovascular:  Negative for chest pain, dyspnea on exertion, leg swelling, palpitations and syncope.         Vitals:   09/02/22 0841  BP: (!) 150/94  Pulse: (!) 56  SpO2: 97%     Body mass index is 25.97 kg/m. Filed Weights   09/02/22 0841  Weight: 181 lb (82.1 kg)     Objective:   Physical Exam Vitals and nursing note reviewed.  Constitutional:      General: He is not in acute distress. Neck:     Vascular: No JVD.   Cardiovascular:     Rate and Rhythm: Normal rate and regular rhythm.     Heart sounds: Murmur heard.     High-pitched blowing decrescendo early diastolic murmur is present with a grade of 1/4 at the upper right sternal border radiating to the apex.  Pulmonary:     Effort: Pulmonary effort is normal.     Breath sounds: Normal breath sounds. No wheezing or rales.  Musculoskeletal:     Right lower leg: No edema.     Left lower leg: No edema.         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 Genetic testing yielded no variant associated with familial aortopathy. Dissection flap is seen extending into the right subclavian artery and proximal right common carotid artery, stable. Continue blood pressure and heart rate control.  Patient last had CT chest (12/2021) that showed TAA 5.3 cm, up from 4.7 cm. However, he was evaluated by Dr. Prescott Gum. His comments below.  The repair is intact but there is a persistent false lumen distally, not  unusual.  He does not meet criteria for anymore aortic surgery at this point but does need annual CTA which [facility] office is scheduling. He should not do any heavy lifting which was discussed at my last OV.   Repeat CT scan and follow-up with Dr. Darcey Nora in summer 2024. Continue hypertension control.  Hypertension: Uncontrolled. Increase losartan-HCTZ from 50-12.5 mg to 100-25 mg daily. BMP in 1 week. F/u BP check in 4 weeks. Goal <130/80 mmHg.  Paroxysmal A. Fib: CHA2DS2VASc score 2, annual stroke risk 2% Continue Eliquis 5 mg twice daily.  Aortic regurgitation: Mild, stable. Will order echocardiogram for 12/2022 at next visit.  H/o DVT: H/o right gastrocnemius DVT, likely related to  immobility post surgery. Remains on Eliquis given paroxysmal A. Fib.  F/u in 4 weeks    Nigel Mormon, MD Pager: [phone] Office: [phone]

## 2022-09-06 ENCOUNTER — Ambulatory Visit: Payer: Medicare Other | Admitting: Cardiology

## 2022-09-10 LAB — BASIC METABOLIC PANEL
BUN/Creatinine Ratio: 16 (ref 10–24)
BUN: 18 mg/dL (ref 8–27)
CO2: 25 mmol/L (ref 20–29)
Calcium: 10.1 mg/dL (ref 8.6–10.2)
Chloride: 100 mmol/L (ref 96–106)
Creatinine, Ser: 1.1 mg/dL (ref 0.76–1.27)
Glucose: 105 mg/dL — ABNORMAL HIGH (ref 70–99)
Potassium: 5.1 mmol/L (ref 3.5–5.2)
Sodium: 140 mmol/L (ref 134–144)
eGFR: 71 mL/min/{1.73_m2} (ref >59)

## 2022-09-13 ENCOUNTER — Telehealth: Payer: Self-pay | Admitting: *Deleted

## 2022-09-13 NOTE — Telephone Encounter (Signed)
Patient returned call, stated he is on Eliquis. PV and colonoscopy canceled. OV scheduled with Vicie Mutters for April 2nd at  11:30

## 2022-09-13 NOTE — Telephone Encounter (Signed)
Called pt. To clarify if he's taking the blood thinner ELIQUIS,if taking he will need OV.

## 2022-09-13 NOTE — Telephone Encounter (Signed)
NOTED

## 2022-09-25 ENCOUNTER — Other Ambulatory Visit: Payer: Self-pay | Admitting: Family Medicine

## 2022-09-25 ENCOUNTER — Other Ambulatory Visit: Payer: Self-pay | Admitting: Cardiology

## 2022-09-25 DIAGNOSIS — I10 Essential (primary) hypertension: Secondary | ICD-10-CM

## 2022-09-25 DIAGNOSIS — D649 Anemia, unspecified: Secondary | ICD-10-CM

## 2022-10-10 ENCOUNTER — Encounter: Payer: Self-pay | Admitting: Cardiology

## 2022-10-10 ENCOUNTER — Ambulatory Visit: Payer: Medicare Other | Admitting: Cardiology

## 2022-10-10 VITALS — BP 135/87 | HR 58 | Ht 70.0 in | Wt 177.0 lb

## 2022-10-10 DIAGNOSIS — I10 Essential (primary) hypertension: Secondary | ICD-10-CM

## 2022-10-10 DIAGNOSIS — Z9889 Other specified postprocedural states: Secondary | ICD-10-CM

## 2022-10-10 DIAGNOSIS — I351 Nonrheumatic aortic (valve) insufficiency: Secondary | ICD-10-CM

## 2022-10-10 MED ORDER — LOSARTAN POTASSIUM-HCTZ 100-25 MG PO TABS: 1.0000 | ORAL_TABLET | Freq: Every day | ORAL | 3 refills | Status: DC

## 2022-10-10 NOTE — Progress Notes (Signed)
Patient referred by Tonia Ghent, MD for aortic dissection  Subjective:   Juan Wagner, male    DOB: 02-15-1951, 72 y.o.   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 He is still avoiding heavy weightlifting. Blood pressure is now better controlled.  Reviewed recent test results with the patient, details below.       Current Outpatient Medications:    amLODipine (NORVASC) 10 MG tablet, Take 1 tablet (10 mg total) by mouth daily., Disp: 90 tablet, Rfl: 3   apixaban (ELIQUIS) 5 MG TABS tablet, Take 1 tablet (5 mg total) by mouth 2 (two) times daily., Disp: 180 tablet, Rfl: 3   atorvastatin (LIPITOR) 10 MG tablet, Take 1 tablet (10 mg total) by mouth daily., Disp: 90 tablet, Rfl: 3   ferrous sulfate 325 (65 FE) MG tablet, TAKE 1 TABLET BY MOUTH EVERY DAY WITH BREAKFAST, Disp: 90 tablet, Rfl: 2   levETIRAcetam (KEPPRA) 500 MG tablet, Take 500 mg by mouth 2 (two) times daily., Disp: , Rfl:    losartan-hydrochlorothiazide (HYZAAR) 100-25 MG tablet, TAKE 1 TABLET BY MOUTH EVERY DAY, Disp: 90 tablet, Rfl: 0   metoprolol succinate (TOPROL-XL) 100 MG 24 hr tablet, Take 1 tablet (100 mg total) by mouth daily. TAKE WITH OR IMMEDIATELY FOLLOWING A MEAL., Disp: 90 tablet, Rfl: 3   Multiple Vitamin (MULTIVITAMIN) tablet, Take 1 tablet by mouth daily. Alive, Disp: , Rfl:    oxcarbazepine (TRILEPTAL) 600 MG tablet, Take 600 mg by mouth 2 (two) times daily., Disp: , Rfl:    tamsulosin (FLOMAX) 0.4 MG CAPS capsule, Take 1 capsule (0.4 mg total) by mouth daily., Disp: 90 capsule, Rfl: 3  Cardiovascular and other pertinent studies:  Mobile cardiac telemetry 14  days 02/27/2022 - 03/13/2022: Dominant rhythm: Sinus. HR 48-137 bpm. Avg HR 68 bpm, in sinus rhythm. 10 episodes of atrial tachycardia, fastest at 129 bpm for 5 beats, longest for 6 beats at 101 bpm.  <1% isolated SVE, couplet/triplets. 0 episodes of VT. <1% isolated VE, couplets. No atrial fibrillation/atrial flutter/VT/high grade AV block, sinus pause >3sec noted. 2 patient triggered events, correlated with sinus rhythm.    Echocardiogram 02/27/2022:  Normal LV systolic function with visual EF 55-60%. Left ventricle cavity  is normal in size. Mild concentric hypertrophy of the left ventricle.  Normal global wall motion. Doppler evidence of grade I (impaired)  diastolic dysfunction, normal LAP. Calculated EF 54%.  Right atrial cavity is mild to moderately dilated.  Right ventricle cavity is moderately dilated. Moderately reduced right  ventricular function.  Structurally normal trileaflet aortic valve.  Mild (Grade I) aortic  regurgitation.  I personally reviewed the echocardiogram. RV is difficult to visualize. I do not think there is any significant RV systolic dysfunction.  CT Chest/abdomen 12/31/2021: 1. Stable postoperative appearance of ascending aortic repair. Persistent dissection flap in the arch, right brachiocephalic and proximal common carotid arteries, and proximal descending aorta as before. 2. 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 Recommend semi-annual imaging followup by CTA or MRA and referral to cardiothoracic surgery (already obtained). This  recommendation follows 2010 ACCF/AHA/AATS/ACR/ASA/SCA/SCAI/SIR/STS/SVM Guidelines for the Diagnosis and Management of Patients With Thoracic Aortic Disease. Circulation. 2010; 121: e266-e36 3. Dilated common iliac arteries as above.  EKG 12/12/2021: Sinus rhythm 76 bpm First degree A-V block Left axis -anterior fascicular block  Voltage  criteria for LVH  Anteroseptal infarct -age undetermined   Echocardiogram 02/12/2021:  Normal LV systolic function with EF 56%. Left ventricle cavity is normal  in size. Mild concentric remodeling of the left ventricle. Normal global  wall motion. Normal diastolic filling pattern. Calculated EF 56%.  Right atrial cavity is moderately dilated.  Right ventricle cavity is normal in size. Moderately reduced right  ventricular function.  Trileaflet aortic valve. No evidence of aortic stenosis. Mild (Grade I)  aortic regurgitation.  Trace tricuspid regurgitation. No evidence of tricuspid valve stenosis. No  evidence of pulmonary hypertension.  The aortic root is mildly dilated at 4.2 cm.  Compared to the echocardiogram done on 05/29/2020, ascending aortic  aneurysm measuring 4.8 cm is not present and there is no evidence of  aortic dissection flap.  Patient is S/P ascending aortic aneurysm repair. Vascular US 06/12/2020: RIGHT:  - Findings consistent with acute deep vein thrombosis involving the right gastrocnemius veins.  - There is no evidence of superficial venous thrombosis.     Op note 05/29/2020 (Dr. Orvan Seen): REPAIR OF ACUTE ASCENDING THORACIC AORTIC DISSECTION USING HEMASHIELD PLATINUM WOVEN DOUBLE VELOUR VASCULAR GRAFT 32 MM X 30 CM (N/A) TRANSESOPHAGEAL ECHOCARDIOGRAM (TEE) (N/A)  Recent labs: 09/09/2022: Glucose 105, BUN/Cr 18/1.1. EGFR 71. Na/K 140/5.1.   02/14/2022: Glucose 91, BUN/Cr 17/1.14. EGFR 64. Na/K 137/4.0. Rest of the CMP normal H/H 12/36. MCV 88. Platelets 196 HbA1C NA Chol 137, TG 60, HDL 63, LDL 61 TSH 1.2 normal  Review of Systems  Cardiovascular:  Negative for chest pain, dyspnea on exertion, leg swelling, palpitations and syncope.         Vitals:   10/10/22 0832  BP: 135/87  Pulse: (!) 58  SpO2: 97%     Body mass index is 25.4 kg/m. Filed Weights   10/10/22 0832  Weight: 177 lb (80.3 kg)     Objective:   Physical Exam Vitals and nursing  note reviewed.  Constitutional:      General: He is not in acute distress. Neck:     Vascular: No JVD.  Cardiovascular:     Rate and Rhythm: Normal rate and regular rhythm.     Heart sounds: Murmur heard.     High-pitched blowing decrescendo early diastolic murmur is present with a grade of 1/4 at the upper right sternal border radiating to the apex.  Pulmonary:     Effort: Pulmonary effort is normal.     Breath sounds: Normal breath sounds. No wheezing or rales.  Musculoskeletal:     Right lower leg: No edema.     Left lower leg: No edema.         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 Genetic testing yielded no variant associated with familial aortopathy. Dissection flap is seen extending into the right subclavian artery and proximal right common carotid artery, stable. Continue blood pressure and heart rate control.  Patient last had CT chest (12/2021) that showed TAA 5.3 cm, up from 4.7 cm. However, he was  evaluated by Dr. Prescott Gum. His comments below.  The repair is intact but there is a persistent false lumen distally, not unusual.  He does not meet criteria for anymore aortic surgery at this point but does need annual CTA which [facility] office is scheduling. He should not do any heavy lifting which was discussed at my last OV.   Repeat CT scan and follow-up with Dr. Darcey Nora in summer 2024. Continue hypertension control.  Hypertension: Now well controlled. Refilled losartan-HCTZ 100-25 mg daily.  Paroxysmal A. Fib: CHA2DS2VASc score 2, annual stroke risk 2% Continue Eliquis 5 mg twice daily.  Aortic regurgitation: Mild, stable. Echocardiogram in 12/2022.  H/o DVT: H/o right gastrocnemius DVT, likely related  to immobility post surgery. Remains on Eliquis given paroxysmal A. Fib.  F/u in 01/2023.    Nigel Mormon, MD Pager: [phone] Office: [phone]

## 2022-10-21 NOTE — Progress Notes (Unsigned)
10/22/2022 KAYLUB CARRAZCO [id number] [birth year]  Referring provider: Tonia Ghent, MD Primary GI doctor: Dr. Fuller Plan  ASSESSMENT AND PLAN:   Iron deficiency anemia, unspecified iron deficiency anemia type Patient had aneurysmal repair 2021, found to have iron deficiency anemia with iron of 25 in 2022 has been on iron since that time.  No formal evaluation. No overt GI bleeding Uncertain if this was an iron deficiency from aneurysmal repair and he is continued on the iron unneeded or if he continues to have an iron deficiency which needs to be evaluated. Will do trial off iron, sent Hemoccult cards to do well off iron and will repeat CBC iron indices while off iron. Patient has no upper GI symptoms however if he has positive FOBT while off iron or his iron decreases in his iron dependent will proceed with EGD and colonoscopy to evaluate both. Discussed this plan with the patient, he agrees. EGD and colonoscopy scheduled Dr. Fuller Plan at [facility] in about 6 weeks which should give [place] enough time for these results.  History of adenomatous polyp of colon 04/2017 colonoscopy 3 polyps 5 to 6 mm tubular adenomatous Patient is due for recall, will schedule at [facility]  Paroxysmal atrial fibrillation with history of DVT Patient is on Eliquis, will discuss with cardiologist safety of stopping Eliquis 2 days prior to endoscopic evaluation.  S/P aortic dissection repair Has repeated follow-up CTA yearly Patient works out, no chest pain or shortness of breath.    Patient Care Team: Tonia Ghent, MD as PCP - General (Family Medicine) Anabel Bene, MD as Referring Physician (Neurology)  HISTORY OF PRESENT ILLNESS: 72 y.o. male referred by Tonia Ghent, MD presents for consultation of colonoscopy while on blood thinner.  04/29/2017 colonoscopy Dr. 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  Patient is on Eliquis for A-fib CHA2DS2-VASc 2 and history of DVT. Last echocardiogram 02/27/2022 showed EF 55 to 60%, mild AR. Continues to get CTA yearly due to aortic aneurysm repair. He is active and bikes/runs, biked 15 miles yesterday.  He has been on iron since 2021, makes his stool dark, states was started after his aneurysm repair 2021, his iron afterwards was low at 25 in 2022. C7/27/2023  WBC 3.4 HGB 12.1 MCV 88.5 Platelets 196.0 Iron 72 B12 737 Most recent CBC still slight anemia but normal iron while on iron daily.   Patient denies  family history of colon cancer or other gastrointestinal malignancies. Patient has a BM every morning.  Patient denies change in bowel habits, constipation, diarrhea, hematochezia.  Denies changes in appetite.  Patient does not complain of GERD.  Has occ hoarseness, has a hard time gaining weight but is very active.  Patient denies dysphagia, nausea, vomiting, melena.  No ETOH, no NSAIDS, no smoking, no drug use.   He  reports that he has never smoked. He has never used smokeless tobacco. He reports that he does not drink alcohol and does not use drugs.  RELEVANT LABS AND IMAGING: CBC    Component Value Date/Time   WBC 3.4 (L) 02/14/2022 0958   RBC 4.13 (L) 02/14/2022 0958   HGB 12.1 (L) 02/14/2022  0958   HCT 36.6 (L) 02/14/2022 0958   PLT 196.0 02/14/2022 0958   MCV 88.5 02/14/2022 0958   MCH 25.8 (L) 09/01/2020 0548   MCHC 33.0 02/14/2022 0958   RDW 13.5 02/14/2022 0958   LYMPHSABS 1.0 02/14/2022 0958   MONOABS 0.6 02/14/2022 0958   EOSABS 0.1 02/14/2022 0958   BASOSABS 0.0 02/14/2022 0958   Recent Labs    02/14/22 0958  HGB 12.1*     CMP     Component Value Date/Time    NA 140 09/09/2022 0918   K 5.1 09/09/2022 0918   CL 100 09/09/2022 0918   CO2 25 09/09/2022 0918   GLUCOSE 105 (H) 09/09/2022 0918   GLUCOSE 91 02/14/2022 0958   BUN 18 09/09/2022 0918   CREATININE 1.10 09/09/2022 0918   CREATININE 1.17 01/30/2013 1645   CALCIUM 10.1 09/09/2022 0918   PROT 7.4 02/14/2022 0958   ALBUMIN 3.9 02/14/2022 0958   AST 21 02/14/2022 0958   ALT 26 02/14/2022 0958   ALKPHOS 85 02/14/2022 0958   BILITOT 0.4 02/14/2022 0958   GFRNONAA >60 09/01/2020 0548   GFRNONAA >60 01/30/2013 1645   GFRAA >60 01/30/2013 1645      Latest Ref Rng & Units 02/14/2022    9:58 AM 02/14/2021   12:06 PM 09/01/2020    5:48 AM  Hepatic Function  Total Protein 6.0 - 8.3 g/dL 7.4  7.5  7.5   Albumin 3.5 - 5.2 g/dL 3.9  3.8  3.2   AST 0 - 37 U/L 21  24  23    ALT 0 - 53 U/L 26  18  21    Alk Phosphatase 39 - 117 U/L 85  77  63   Total Bilirubin 0.2 - 1.2 mg/dL 0.4  0.4  0.5       Current Medications:    Current Outpatient Medications (Cardiovascular):    amLODipine (NORVASC) 10 MG tablet, Take 1 tablet (10 mg total) by mouth daily.   atorvastatin (LIPITOR) 10 MG tablet, Take 1 tablet (10 mg total) by mouth daily.   losartan-hydrochlorothiazide (HYZAAR) 100-25 MG tablet, Take 1 tablet by mouth daily.   metoprolol succinate (TOPROL-XL) 100 MG 24 hr tablet, Take 1 tablet (100 mg total) by mouth daily. TAKE WITH OR IMMEDIATELY FOLLOWING A MEAL.    Current Outpatient Medications (Hematological):    apixaban (ELIQUIS) 5 MG TABS tablet, Take 1 tablet (5 mg total) by mouth 2 (two) times daily.   ferrous sulfate 325 (65 FE) MG tablet, TAKE 1 TABLET BY MOUTH EVERY DAY WITH BREAKFAST  Current Outpatient Medications (Other):    levETIRAcetam (KEPPRA) 500 MG tablet, Take 500 mg by mouth 2 (two) times daily.   Multiple Vitamin (MULTIVITAMIN) tablet, Take 1 tablet by mouth daily. Alive   oxcarbazepine (TRILEPTAL) 600 MG tablet, Take 600 mg by mouth 2 (two) times daily.   tamsulosin  (FLOMAX) 0.4 MG CAPS capsule, Take 1 capsule (0.4 mg total) by mouth daily.  Medical History:  Past Medical History:  Diagnosis Date   Aortic dissection, thoracic 05/29/2020   DVT (deep venous thrombosis) 06/12/2020   RLE DVT   ED (erectile dysfunction)    Hyperlipidemia    2018 was 184- past hx    Hypertension    Seizures    1st event 03/2011- no seizure in years as of 2019   Sleep apnea    no CPAP use as of 2019   Allergies:  Allergies  Allergen Reactions   Lisinopril Other (See  Comments)    Presumed cause of cough   Prevnar [Pneumococcal 13-Val Conj Vacc]     Local reaction.    Quinolones     Would avoid, h/o aortic dissection.     Viagra [Sildenafil Citrate] Other (See Comments)    headache     Surgical History:  He  has a past surgical history that includes Septoplasty; Knee surgery; Colonoscopy; Polypectomy; Repair of acute ascending thoracic aortic dissection (N/A, 05/29/2020); TEE without cardioversion (N/A, 05/29/2020); Wound debridement (N/A, 0000000); and Application if wound vac (N/A, 09/01/2020). Family History:  His family history includes Alcohol abuse in his father; Breast cancer in his mother; Cancer in his brother, father, mother, and sister; Cirrhosis in his brother; Esophageal cancer in his father; Hypertension in his maternal grandfather, maternal grandmother, and mother; Lung cancer in his brother; Ovarian cancer in his mother; Pancreatic cancer in his sister; Throat cancer in his father.  REVIEW OF SYSTEMS  : All other systems reviewed and negative except where noted in the History of Present Illness.  PHYSICAL EXAM: BP 104/72   Pulse 64   Ht 5\' 10"  (1.778 m)   Wt 179 lb (81.2 kg)   SpO2 98%   BMI 25.68 kg/m  General Appearance: Well nourished, in no apparent distress. Head:   Normocephalic and atraumatic. Eyes:  sclerae anicteric,conjunctive pink  Respiratory: Respiratory effort normal, BS equal bilaterally without rales, rhonchi,  wheezing. Cardio: RRR with holosystolic murmur. Peripheral pulses intact.  Abdomen: Soft,  Non-distended ,active bowel sounds. No tenderness . No masses. Rectal: Not evaluated Musculoskeletal: Full ROM, Normal gait. Without edema. Skin:  Dry and intact without significant lesions or rashes Neuro: Alert and  oriented x4;  No focal deficits. Psych:  Cooperative. Normal mood and affect.    Vladimir Crofts, PA-C 11:49 AM

## 2022-10-22 ENCOUNTER — Encounter: Payer: Self-pay | Admitting: Physician Assistant

## 2022-10-22 ENCOUNTER — Ambulatory Visit: Payer: Medicare Other | Admitting: Physician Assistant

## 2022-10-22 ENCOUNTER — Encounter: Payer: Self-pay | Admitting: Cardiology

## 2022-10-22 ENCOUNTER — Telehealth: Payer: Self-pay | Admitting: *Deleted

## 2022-10-22 VITALS — BP 104/72 | HR 64 | Ht 70.0 in | Wt 179.0 lb

## 2022-10-22 DIAGNOSIS — I480 Paroxysmal atrial fibrillation: Secondary | ICD-10-CM

## 2022-10-22 DIAGNOSIS — D509 Iron deficiency anemia, unspecified: Secondary | ICD-10-CM | POA: Diagnosis not present

## 2022-10-22 DIAGNOSIS — Z86010 Personal history of colonic polyps: Secondary | ICD-10-CM | POA: Diagnosis not present

## 2022-10-22 DIAGNOSIS — I82509 Chronic embolism and thrombosis of unspecified deep veins of unspecified lower extremity: Secondary | ICD-10-CM | POA: Diagnosis not present

## 2022-10-22 DIAGNOSIS — Z9889 Other specified postprocedural states: Secondary | ICD-10-CM

## 2022-10-22 MED ORDER — NA SULFATE-K SULFATE-MG SULF 17.5-3.13-1.6 GM/177ML PO SOLN: ORAL | 0 refills | Status: DC

## 2022-10-22 NOTE — Telephone Encounter (Signed)
I will send letter to Dr Fuller Plan and you on Epic. Okay to hold Eliquis 2 days before the procedure.   Thanks MJP

## 2022-10-22 NOTE — Telephone Encounter (Signed)
noted 

## 2022-10-22 NOTE — Telephone Encounter (Signed)
[facility] Pre-operative Risk Assessment     Request for surgical clearance:     Endoscopy Procedure  What type of surgery is being performed?     Endoscopy  colonoscopy  When is this surgery scheduled?     12/18/2022  What type of clearance is required ?   Pharmacy  Are there any medications that need to be held prior to surgery and how long? 2 days   Practice name and name of physician performing surgery?      [facility] Dr Fuller Plan   What is your office phone and fax number?      Phone- [phone]  [phone]  Anesthesia type (None, local, MAC, general) ?       MAC

## 2022-10-22 NOTE — Telephone Encounter (Signed)
   Patient Name: Juan Wagner  DOB: [birth year] MRN: [id number]  Primary Cardiologist: None  Chart reviewed as part of pre-operative protocol coverage.  Patient is currently managed by [facility] and is not established with [facility] health medical [facility].  Clearance will need to come from primary cardiologist and PCP regarding Eliquis.   Mable Fill, Marissa Nestle, NP 10/22/2022, 12:15 PM

## 2022-10-22 NOTE — Patient Instructions (Addendum)
Stop ferrous sulfate tomorrow  Do hemoccult cards in 1 week  Will recheck iron and CBC  in 2-4 weeks  If you have iron deficiency or hemoccult cards are postive will continue with EGD and colon, otherwise will cancel EGD  You will be contacted by our office prior to your procedure for directions on holding your Eliquis.  If you do not hear from our office 1 week prior to your scheduled procedure, please call [phone] to discuss.    You have been scheduled for an endoscopy and colonoscopy. Please follow the written instructions given to you at your visit today. Please pick up your prep supplies at the pharmacy within the next 1-3 days. If you use inhalers (even only as needed), please bring them with you on the day of your procedure.   Due to recent changes in healthcare laws, you may see the results of your imaging and laboratory studies on MyChart before your provider has had a chance to review them.  We understand that in some cases there may be results that are confusing or concerning to you. Not all laboratory results come back in the same time frame and the provider may be waiting for multiple results in order to interpret others.  Please give [place] 48 hours in order for your provider to thoroughly review all the results before contacting the office for clarification of your results.    Follow the instructions on the Hemoccult cards and mail them back to [place] when you are finished or you may take them directly to the lab in the basement of the [place] building. We will call you with the results.     I appreciate the  opportunity to care for you  Thank You   [facility]

## 2022-10-24 NOTE — Telephone Encounter (Signed)
Called patient to inform him that he can hold Eliquis two days before procedure had to leave a message

## 2022-10-31 ENCOUNTER — Encounter: Payer: Medicare Other | Admitting: Gastroenterology

## 2022-11-01 ENCOUNTER — Other Ambulatory Visit (INDEPENDENT_AMBULATORY_CARE_PROVIDER_SITE_OTHER): Payer: Medicare Other

## 2022-11-01 DIAGNOSIS — Z86010 Personal history of colonic polyps: Secondary | ICD-10-CM

## 2022-11-01 LAB — HEMOCCULT SLIDES (X 3 CARDS)
Fecal Occult Blood: NEGATIVE
OCCULT 1: NEGATIVE
OCCULT 2: NEGATIVE
OCCULT 3: NEGATIVE
OCCULT 4: NEGATIVE
OCCULT 5: NEGATIVE

## 2022-12-03 ENCOUNTER — Other Ambulatory Visit: Payer: Self-pay | Admitting: Cardiothoracic Surgery

## 2022-12-03 DIAGNOSIS — I7101 Dissection of ascending aorta: Secondary | ICD-10-CM

## 2022-12-18 ENCOUNTER — Encounter: Payer: Self-pay | Admitting: Gastroenterology

## 2022-12-18 ENCOUNTER — Ambulatory Visit (AMBULATORY_SURGERY_CENTER): Payer: Medicare Other | Admitting: Gastroenterology

## 2022-12-18 VITALS — BP 102/78 | HR 63 | Temp 97.3°F | Resp 13 | Ht 70.0 in | Wt 179.0 lb

## 2022-12-18 DIAGNOSIS — Z86010 Personal history of colonic polyps: Secondary | ICD-10-CM | POA: Diagnosis not present

## 2022-12-18 DIAGNOSIS — K2950 Unspecified chronic gastritis without bleeding: Secondary | ICD-10-CM

## 2022-12-18 DIAGNOSIS — Z09 Encounter for follow-up examination after completed treatment for conditions other than malignant neoplasm: Secondary | ICD-10-CM

## 2022-12-18 DIAGNOSIS — K2970 Gastritis, unspecified, without bleeding: Secondary | ICD-10-CM

## 2022-12-18 DIAGNOSIS — D122 Benign neoplasm of ascending colon: Secondary | ICD-10-CM | POA: Diagnosis not present

## 2022-12-18 DIAGNOSIS — D509 Iron deficiency anemia, unspecified: Secondary | ICD-10-CM

## 2022-12-18 DIAGNOSIS — B9681 Helicobacter pylori [H. pylori] as the cause of diseases classified elsewhere: Secondary | ICD-10-CM

## 2022-12-18 MED ORDER — PANTOPRAZOLE SODIUM 40 MG PO TBEC: 40.0000 mg | DELAYED_RELEASE_TABLET | Freq: Every day | ORAL | 4 refills | Status: AC

## 2022-12-18 MED ORDER — SODIUM CHLORIDE 0.9 % IV SOLN: 500.0000 mL | Freq: Once | INTRAVENOUS | Status: DC

## 2022-12-18 NOTE — Op Note (Signed)
[facility] Patient Name: Juan Wagner Procedure Date: 12/18/2022 9:30 AM MRN: [id number] Endoscopist: Meryl Dare , MD, [id number] Age: 72 Referring MD:  Date of Birth: Jul 17, 1951 Gender: Male Account #: [id number] Procedure:                Colonoscopy Indications:              Unexplained iron deficiency anemia (heme negative                            stool), Personal history of adenomatous colon                            polyps. Medicines:                Monitored Anesthesia Care Procedure:                Pre-Anesthesia Assessment:                           - Prior to the procedure, a History and Physical                            was performed, and patient medications and                            allergies were reviewed. The patient's tolerance of                            previous anesthesia was also reviewed. The risks                            and benefits of the procedure and the sedation                            options and risks were discussed with the patient.                            All questions were answered, and informed consent                            was obtained. Prior Anticoagulants: The patient has                            taken Eliquis (apixaban), last dose was 2 days                            prior to procedure. ASA Grade Assessment: II - A                            patient with mild systemic disease. After reviewing                            the risks and benefits, the patient was deemed in  satisfactory condition to undergo the procedure.                           After obtaining informed consent, the colonoscope                            was passed under direct vision. Throughout the                            procedure, the patient's blood pressure, pulse, and                            oxygen saturations were monitored continuously. The                            Olympus CF-[id number] ([id number])  Colonoscope was                            introduced through the anus and advanced to the the                            cecum, identified by appendiceal orifice and                            ileocecal valve. The ileocecal valve, appendiceal                            orifice, and rectum were photographed however due                            to techincal problems only the rectal photographs                            were captured. The quality of the bowel preparation                            was good. The colonoscopy was performed without                            difficulty. The patient tolerated the procedure                            well. Scope In: 10:04:59 AM Scope Out: 10:19:06 AM Scope Withdrawal Time: 0 hours 10 minutes 29 seconds  Total Procedure Duration: 0 hours 14 minutes 7 seconds  Findings:                 The perianal and digital rectal examinations were                            normal.                           A 7 mm polyp was found in the ascending colon. The  polyp was sessile. The polyp was removed with a                            cold snare. Resection and retrieval were complete.                           The exam was otherwise without abnormality on                            direct and retroflexion views. Complications:            No immediate complications. Estimated blood loss:                            None. Estimated Blood Loss:     Estimated blood loss: none. Impression:               - One 7 mm polyp in the ascending colon, removed                            with a cold snare. Resected and retrieved.                           - The examination was otherwise normal on direct                            and retroflexion views. Recommendation:           - Repeat colonoscopy after studies are complete for                            surveillance based on pathology results.                           - Resume Eliquis (apixaban)  in 2 days at prior                            dose. Refer to managing physician for further                            adjustment of therapy.                           - Patient has a contact number available for                            emergencies. The signs and symptoms of potential                            delayed complications were discussed with the                            patient. Return to normal activities tomorrow.                            Written  discharge instructions were provided to the                            patient.                           - Resume previous diet.                           - Continue present medications.                           - Await pathology results.                           - Consider VCE to further evaluate - will discuss. Meryl Dare, MD 12/18/2022 10:34:57 AM This report has been signed electronically.

## 2022-12-18 NOTE — Progress Notes (Signed)
Called to room to assist during endoscopic procedure.  Patient ID and intended procedure confirmed with present staff. Received instructions for my participation in the procedure from the performing physician.  

## 2022-12-18 NOTE — Op Note (Addendum)
[facility] Patient Name: Juan Wagner Procedure Date: 12/18/2022 9:42 AM MRN: [id number] Endoscopist: Meryl Dare , MD, [phone] Age: 72 Referring MD:  Date of Birth: 21-Jul-1951 Gender: Male Account #: [id number] Procedure:                Upper GI endoscopy Indications:              Iron deficiency anemia Medicines:                Monitored Anesthesia Care Procedure:                Pre-Anesthesia Assessment:                           - Prior to the procedure, a History and Physical                            was performed, and patient medications and                            allergies were reviewed. The patient's tolerance of                            previous anesthesia was also reviewed. The risks                            and benefits of the procedure and the sedation                            options and risks were discussed with the patient.                            All questions were answered, and informed consent                            was obtained. Prior Anticoagulants: The patient has                            taken Eliquis (apixaban), last dose was 2 days                            prior to procedure. ASA Grade Assessment: II - A                            patient with mild systemic disease. After reviewing                            the risks and benefits, the patient was deemed in                            satisfactory condition to undergo the procedure.                           After obtaining informed consent, the endoscope was  passed under direct vision. Throughout the                            procedure, the patient's blood pressure, pulse, and                            oxygen saturations were monitored continuously. The                            GIF HQ190 #[id number] was introduced through the                            mouth, and advanced to the second part of duodenum.                            The upper GI  endoscopy was accomplished without                            difficulty. The patient tolerated the procedure                            well. Scope In: Scope Out: Findings:                 The examined esophagus was normal.                           Diffuse mild inflammation characterized by erythema                            and granularity was found in the gastric fundus and                            in the gastric body. Biopsies were taken with a                            cold forceps for histology.                           The exam of the stomach was otherwise normal.                           The duodenal bulb and second portion of the                            duodenum were normal. Biopsies for histology were                            taken with a cold forceps for evaluation of celiac                            disease. Complications:            No immediate complications. Estimated Blood Loss:     Estimated blood loss was minimal. Impression:               -  Normal esophagus.                           - Gastritis. Biopsied.                           - Normal duodenal bulb and second portion of the                            duodenum. Biopsied. Recommendation:           - Patient has a contact number available for                            emergencies. The signs and symptoms of potential                            delayed complications were discussed with the                            patient. Return to normal activities tomorrow.                            Written discharge instructions were provided to the                            patient.                           - Resume previous diet.                           - Continue present medications.                           - Start pantoprazole 40 mg po qd.                           - Await pathology results.                           - Resume Eliquis (apixaban) at prior dose in 2                            days. Refer  to managing physician for further                            adjustment of therapy. Meryl Dare, MD 12/18/2022 16:10:96 AM This report has been signed electronically.

## 2022-12-18 NOTE — Patient Instructions (Signed)
Resume Eliquis in 2 days at prior dosage. Refer to PCP for further adjustment of therapy. Resume previous diet and other medications. Awaiting pathology results. Consider VCE to further evaluate if problem persists.  Handouts provided on Colon polyps and Gastritis  YOU HAD AN ENDOSCOPIC PROCEDURE TODAY AT THE [facility]:   Refer to the procedure report that was given to you for any specific questions about what was found during the examination.  If the procedure report does not answer your questions, please call your gastroenterologist to clarify.  If you requested that your care partner not be given the details of your procedure findings, then the procedure report has been included in a sealed envelope for you to review at your convenience later.  YOU SHOULD EXPECT: Some feelings of bloating in the abdomen. Passage of more gas than usual.  Walking can help get rid of the air that was put into your GI tract during the procedure and reduce the bloating. If you had a lower endoscopy (such as a colonoscopy or flexible sigmoidoscopy) you may notice spotting of blood in your stool or on the toilet paper. If you underwent a bowel prep for your procedure, you may not have a normal bowel movement for a few days.  Please Note:  You might notice some irritation and congestion in your nose or some drainage.  This is from the oxygen used during your procedure.  There is no need for concern and it should clear up in a day or so.  SYMPTOMS TO REPORT IMMEDIATELY:  Following lower endoscopy (colonoscopy or flexible sigmoidoscopy):  Excessive amounts of blood in the stool  Significant tenderness or worsening of abdominal pains  Swelling of the abdomen that is new, acute  Fever of 100F or higher  Following upper endoscopy (EGD)  Vomiting of blood or coffee ground material  New chest pain or pain under the shoulder blades  Painful or persistently difficult swallowing  New shortness of breath  Fever  of 100F or higher  Black, tarry-looking stools  For urgent or emergent issues, a gastroenterologist can be reached at any hour by calling [phone]. Do not use MyChart messaging for urgent concerns.    DIET:  We do recommend a small meal at first, but then you may proceed to your regular diet.  Drink plenty of fluids but you should avoid alcoholic beverages for 24 hours.  ACTIVITY:  You should plan to take it easy for the rest of today and you should NOT DRIVE or use heavy machinery until tomorrow (because of the sedation medicines used during the test).    FOLLOW UP: Our staff will call the number listed on your records the next business day following your procedure.  We will call around 7:15- 8:00 am to check on you and address any questions or concerns that you may have regarding the information given to you following your procedure. If we do not reach you, we will leave a message.     If any biopsies were taken you will be contacted by phone or by letter within the next 1-3 weeks.  Please call us at [phone] if you have not heard about the biopsies in 3 weeks.    SIGNATURES/CONFIDENTIALITY: You and/or your care partner have signed paperwork which will be entered into your electronic medical record.  These signatures attest to the fact that that the information above on your After Visit Summary has been reviewed and is understood.  Full responsibility of the  confidentiality of this discharge information lies with you and/or your care-partner.

## 2022-12-18 NOTE — Progress Notes (Signed)
History & Physical  Primary Care Physician:  Joaquim Nam, MD Primary Gastroenterologist: Claudette Head, MD  Impression / Plan:  Iron deficiency anemia, personal history of adenomatous colon polyps for colonoscopy and EGD.   CHIEF COMPLAINT:  IDA, Personal history of colon polyps   HPI: Juan Wagner is a 72 y.o. male with iron deficiency anemia and a personal history of adenomatous colon polyps for colonoscopy and EGD.  Eliquis held for 2 days prior to procedure.   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   DVT (deep venous thrombosis) (HCC) 06/12/2020   RLE DVT   ED (erectile dysfunction)    Hyperlipidemia    2018 was 184- past hx    Hypertension    Seizures (HCC)    1st event 03/2011- no seizure in years as of 2019   Sleep apnea    no CPAP use as of 2019    Past Surgical History:  Procedure Laterality Date   APPLICATION OF WOUND VAC N/A 09/01/2020   Procedure: possible APPLICATION OF WOUND VAC;  Surgeon: Linden Dolin, MD;  Location: [facility] OR;  Service: Vascular;  Laterality: N/A;   COLONOSCOPY     KNEE SURGERY     scope L knee   POLYPECTOMY     REPAIR OF ACUTE ASCENDING THORACIC AORTIC DISSECTION N/A 05/29/2020   Procedure: REPAIR OF ACUTE ASCENDING THORACIC AORTIC DISSECTION USING HEMASHIELD PLATINUM WOVEN DOUBLE VELOUR VASCULAR GRAFT 32 MM X 30 CM;  Surgeon: Linden Dolin, MD;  Location: [facility] OR;  Service: Vascular;  Laterality: N/A;   SEPTOPLASTY     broken nose   TEE WITHOUT CARDIOVERSION N/A 05/29/2020   Procedure: TRANSESOPHAGEAL ECHOCARDIOGRAM (TEE);  Surgeon: Linden Dolin, MD;  Location: [facility] OR;  Service: Open Heart Surgery;  Laterality: N/A;   WOUND DEBRIDEMENT N/A 09/01/2020   Procedure: WOUND DEBRIDEMENT OF LOWER PART OF STERNUM;  Surgeon: Linden Dolin, MD;  Location: [facility] OR;  Service: Vascular;  Laterality: N/A;    Prior to Admission medications   Medication Sig Start Date End Date Taking?  Authorizing Provider  amLODipine (NORVASC) 10 MG tablet Take 1 tablet (10 mg total) by mouth daily. 02/21/22  Yes Joaquim Nam, MD  atorvastatin (LIPITOR) 10 MG tablet Take 1 tablet (10 mg total) by mouth daily. 02/21/22  Yes Joaquim Nam, MD  levETIRAcetam (KEPPRA) 500 MG tablet Take 500 mg by mouth 2 (two) times daily. 05/20/12  Yes [provider]  losartan-hydrochlorothiazide (HYZAAR) 100-25 MG tablet Take 1 tablet by mouth daily. 10/10/22  Yes Patwardhan, Manish J, MD  metoprolol succinate (TOPROL-XL) 100 MG 24 hr tablet Take 1 tablet (100 mg total) by mouth daily. TAKE WITH OR IMMEDIATELY FOLLOWING A MEAL. 02/21/22  Yes Joaquim Nam, MD  Multiple Vitamin (MULTIVITAMIN) tablet Take 1 tablet by mouth daily. Alive   Yes [provider]  oxcarbazepine (TRILEPTAL) 600 MG tablet Take 600 mg by mouth 2 (two) times daily.   Yes [provider]  tamsulosin (FLOMAX) 0.4 MG CAPS capsule Take 1 capsule (0.4 mg total) by mouth daily. 02/21/22  Yes Joaquim Nam, MD  apixaban (ELIQUIS) 5 MG TABS tablet Take 1 tablet (5 mg total) by mouth 2 (two) times daily. 02/21/22   Joaquim Nam, MD  ferrous sulfate 325 (65 FE) MG tablet TAKE 1 TABLET BY MOUTH EVERY DAY WITH BREAKFAST 09/25/22   Joaquim Nam, MD    Current Outpatient Medications  Medication Sig Dispense Refill   amLODipine (NORVASC) 10 MG tablet Take 1 tablet (10 mg total) by mouth daily. 90 tablet 3   atorvastatin (LIPITOR) 10 MG tablet Take 1 tablet (10 mg total) by mouth daily. 90 tablet 3   levETIRAcetam (KEPPRA) 500 MG tablet Take 500 mg by mouth 2 (two) times daily.     losartan-hydrochlorothiazide (HYZAAR) 100-25 MG tablet Take 1 tablet by mouth daily. 90 tablet 3   metoprolol succinate (TOPROL-XL) 100 MG 24 hr tablet Take 1 tablet (100 mg total) by mouth daily. TAKE WITH OR IMMEDIATELY FOLLOWING A MEAL. 90 tablet 3   Multiple Vitamin (MULTIVITAMIN) tablet Take 1 tablet by mouth daily. Alive      oxcarbazepine (TRILEPTAL) 600 MG tablet Take 600 mg by mouth 2 (two) times daily.     tamsulosin (FLOMAX) 0.4 MG CAPS capsule Take 1 capsule (0.4 mg total) by mouth daily. 90 capsule 3   apixaban (ELIQUIS) 5 MG TABS tablet Take 1 tablet (5 mg total) by mouth 2 (two) times daily. 180 tablet 3   ferrous sulfate 325 (65 FE) MG tablet TAKE 1 TABLET BY MOUTH EVERY DAY WITH BREAKFAST 90 tablet 2   Current Facility-Administered Medications  Medication Dose Route Frequency Provider Last Rate Last Admin   0.9 %  sodium chloride infusion  500 mL Intravenous Once Meryl Dare, MD        Allergies as of 12/18/2022 - Review Complete 12/18/2022  Allergen Reaction Noted   Lisinopril Other (See Comments) 08/04/2006   Prevnar [pneumococcal 13-val conj vacc]  01/31/2016   Quinolones  06/13/2020   Viagra [sildenafil citrate] Other (See Comments) 01/30/2016    Family History  Problem Relation Age of Onset   Hypertension Mother    Cancer Mother        ovarian CA chemo 2010 remission   Breast cancer Mother    Ovarian cancer Mother    Alcohol abuse Father    Cancer Father    Throat cancer Father    Esophageal cancer Father    Cancer Sister        pancreatic CA Stage IV,mets to lungs   Pancreatic cancer Sister    Cirrhosis Brother        smoke and drugs   Cancer Brother        lung cancer   Lung cancer Brother    Hypertension Maternal Grandmother    Hypertension Maternal Grandfather    Prostate cancer Neg Hx    Colon cancer Neg Hx    Colon polyps Neg Hx    Rectal cancer Neg Hx    Stomach cancer Neg Hx     Social History   Socioeconomic History   Marital status: Married    Spouse name: Not on file   Number of children: 2   Years of education: Not on file   Highest education level: Not on file  Occupational History   Not on file  Tobacco Use   Smoking status: Never   Smokeless tobacco: Never  Vaping Use   Vaping Use: Never used  Substance and Sexual Activity   Alcohol use: No     Alcohol/week: 0.0 standard drinks of alcohol   Drug use: No   Sexual activity: Yes  Other Topics Concern   Not on file  Social History Narrative   Married 50 years   2 kids   Retired from [employer] in [place]   Social Determinants of [employer] Strain: Low  Risk  (02/15/2021)   Overall Financial Resource Strain (CARDIA)    Difficulty of Paying Living Expenses: Not hard at all  Food Insecurity: No Food Insecurity (02/15/2021)   Hunger Vital Sign    Worried About Running Out of Food in the Last Year: Never true    Ran Out of Food in the Last Year: Never true  Transportation Needs: No Transportation Needs (02/15/2021)   PRAPARE - Administrator, Civil Service (Medical): No    Lack of Transportation (Non-Medical): No  Physical Activity: Sufficiently Active (02/15/2021)   Exercise Vital Sign    Days of Exercise per Week: 3 days    Minutes of Exercise per Session: 60 min  Stress: No Stress Concern Present (02/15/2021)   [employer] of Occupational Health - Occupational Stress Questionnaire    Feeling of Stress : Not at all  Social Connections: Not on file  Intimate Partner Violence: Not At Risk (02/15/2021)   Humiliation, Afraid, Rape, and Kick questionnaire    Fear of Current or Ex-Partner: No    Emotionally Abused: No    Physically Abused: No    Sexually Abused: No    Review of Systems:  All systems reviewed were negative except where noted in HPI.   Physical Exam:  General:  Alert, well-developed, in NAD Head:  Normocephalic and atraumatic. Eyes:  Sclera clear, no icterus.   Conjunctiva pink. Ears:  Normal auditory acuity. Mouth:  No deformity or lesions.  Neck:  Supple; no masses. Lungs:  Clear throughout to auscultation.   No wheezes, crackles, or rhonchi.  Heart:  Regular rate and rhythm; no murmurs. Abdomen:  Soft, nondistended, nontender. No masses, hepatomegaly. No palpable masses.  Normal bowel sounds.    Rectal:   Deferred   Msk:  Symmetrical without gross deformities. Extremities:  Without edema. Neurologic:  Alert and  oriented x 4; grossly normal neurologically. Skin:  Intact without significant lesions or rashes. Psych:  Alert and cooperative. Normal mood and affect.   Venita Lick. Russella Dar  12/18/2022, 9:45 AM See Loretha Stapler, [facility], to contact our on call provider

## 2022-12-18 NOTE — Progress Notes (Signed)
Pt's states no medical or surgical changes since previsit or office visit. 

## 2022-12-18 NOTE — Progress Notes (Signed)
To pacu, VSS. Report to [email or web address].tb 

## 2022-12-19 ENCOUNTER — Telehealth: Payer: Self-pay

## 2022-12-19 NOTE — Telephone Encounter (Signed)
  Follow up Call-     12/18/2022    9:30 AM  Call back number  Post procedure Call Back phone  # [phone]  Permission to leave phone message Yes     Patient questions:  Do you have a fever, pain , or abdominal swelling? No. Pain Score  0 *  Have you tolerated food without any problems? Yes.    Have you been able to return to your normal activities? Yes.    Do you have any questions about your discharge instructions: Diet   No. Medications  No. Follow up visit  No.  Do you have questions or concerns about your Care? No.  Actions: * If pain score is 4 or above: No action needed, pain <4.

## 2023-01-01 ENCOUNTER — Other Ambulatory Visit: Payer: Self-pay | Admitting: Cardiology

## 2023-01-01 DIAGNOSIS — I82509 Chronic embolism and thrombosis of unspecified deep veins of unspecified lower extremity: Secondary | ICD-10-CM

## 2023-01-02 ENCOUNTER — Other Ambulatory Visit: Payer: Self-pay

## 2023-01-02 DIAGNOSIS — A048 Other specified bacterial intestinal infections: Secondary | ICD-10-CM

## 2023-01-02 MED ORDER — METRONIDAZOLE 250 MG PO TABS: 250.0000 mg | ORAL_TABLET | Freq: Four times a day (QID) | ORAL | 0 refills | Status: AC

## 2023-01-02 MED ORDER — DOXYCYCLINE HYCLATE 100 MG PO CAPS: 100.0000 mg | ORAL_CAPSULE | Freq: Two times a day (BID) | ORAL | 0 refills | Status: AC

## 2023-01-02 MED ORDER — BISMUTH SUBSALICYLATE 262 MG PO TABS: 2.0000 | ORAL_TABLET | Freq: Four times a day (QID) | ORAL | 0 refills | Status: AC

## 2023-01-07 ENCOUNTER — Other Ambulatory Visit: Payer: Self-pay

## 2023-01-10 ENCOUNTER — Ambulatory Visit: Payer: Medicare Other

## 2023-01-10 DIAGNOSIS — Z9889 Other specified postprocedural states: Secondary | ICD-10-CM

## 2023-01-10 DIAGNOSIS — I10 Essential (primary) hypertension: Secondary | ICD-10-CM

## 2023-01-14 ENCOUNTER — Telehealth: Payer: Self-pay | Admitting: Physician Assistant

## 2023-01-14 NOTE — Telephone Encounter (Signed)
Inbound call from patient requesting a call back regarding stool sample. States he has some questions he would like to discuss. Please advise, thank you.

## 2023-01-14 NOTE — Telephone Encounter (Signed)
Pt currently taking antibiotics for H pylori, he is calling wanting to know when he needs to come in to do stool test. Discussed with pt that he needs to finish the med and then have test 4 weeks later. Let him know we will remind him when it is time.

## 2023-01-20 ENCOUNTER — Ambulatory Visit
Admission: RE | Admit: 2023-01-20 | Discharge: 2023-01-20 | Disposition: A | Discharge status: Home / Self Care | Payer: Medicare Other | Source: Ambulatory Visit | Attending: Cardiothoracic Surgery | Admitting: Cardiothoracic Surgery

## 2023-01-20 ENCOUNTER — Ambulatory Visit: Payer: Medicare Other | Admitting: Cardiothoracic Surgery

## 2023-01-20 DIAGNOSIS — I7101 Dissection of ascending aorta: Secondary | ICD-10-CM

## 2023-01-20 HISTORY — PX: COLONOSCOPY: SHX174

## 2023-01-20 MED ORDER — IOPAMIDOL (ISOVUE-370) INJECTION 76%
75.0000 mL | Freq: Once | INTRAVENOUS | Status: AC | PRN
  Administered 2023-01-20: 75 mL via INTRAVENOUS

## 2023-01-29 ENCOUNTER — Ambulatory Visit: Payer: Medicare Other | Admitting: Cardiology

## 2023-02-03 ENCOUNTER — Encounter: Payer: Self-pay | Admitting: Cardiothoracic Surgery

## 2023-02-03 ENCOUNTER — Ambulatory Visit: Payer: Medicare Other | Admitting: Cardiothoracic Surgery

## 2023-02-03 VITALS — BP 128/77 | HR 64 | Resp 18 | Ht 70.0 in | Wt 175.0 lb

## 2023-02-03 DIAGNOSIS — Z9889 Other specified postprocedural states: Secondary | ICD-10-CM | POA: Diagnosis not present

## 2023-02-03 DIAGNOSIS — M314 Aortic arch syndrome [Takayasu]: Secondary | ICD-10-CM | POA: Diagnosis not present

## 2023-02-03 NOTE — Progress Notes (Signed)
HPI: The patient returns for scheduled annual visit with surveillance CTA of the thoracic aorta after undergoing a emergency repair of type a aortic dissection by Dr. Vickey Sages in 2021.  I examined the patient and reviewed the images of his scan performed January 31, 2023.  Over the past year the patient has had no major events. He was evaluated by his cardiologist for some nonspecific chest pain with an echo which was normal and a Zio patch monitor which showed no A-fib.  He is compliant with his blood pressure medications and checks his blood pressure at home which does not increase over 140 systolic.  He remains active riding his bicycle and doing some exercises with the [employer]. Patient has been on Eliquis for couple years because of some postoperative transient A-fib and postoperative DVT probably related to the use of factor VII for coagulopathy during dissection repair.  Today's CTA shows an intact repair from the sinotubular junction to the proximal arch.  The intimal tear continues beyond the distal anastomosis through the arch and into the descending thoracic aorta where it terminates.  The false lumen created by the minimal tear has increased over the past year so that the total diameter of the proximal descending thoracic aorta is now measured at 5.2 cm. Patient remains asymptomatic.  In order to help promote thrombosis of the false lumen and remodel the aortic wall discontinuation of the Eliquis would be indicated.  Patient will see his cardiologist later this month.  Current Outpatient Medications  Medication Sig Dispense Refill   amLODipine (NORVASC) 10 MG tablet Take 1 tablet (10 mg total) by mouth daily. 90 tablet 3   apixaban (ELIQUIS) 5 MG TABS tablet Take 1 tablet (5 mg total) by mouth 2 (two) times daily. 180 tablet 3   atorvastatin (LIPITOR) 10 MG tablet Take 1 tablet (10 mg total) by mouth daily. 90 tablet 3   ferrous sulfate 325 (65 FE) MG tablet TAKE 1 TABLET BY MOUTH  EVERY DAY WITH BREAKFAST 90 tablet 2   levETIRAcetam (KEPPRA) 500 MG tablet Take 500 mg by mouth 2 (two) times daily.     losartan-hydrochlorothiazide (HYZAAR) 100-25 MG tablet Take 1 tablet by mouth daily. 90 tablet 3   metoprolol succinate (TOPROL-XL) 100 MG 24 hr tablet Take 1 tablet (100 mg total) by mouth daily. TAKE WITH OR IMMEDIATELY FOLLOWING A MEAL. 90 tablet 3   Multiple Vitamin (MULTIVITAMIN) tablet Take 1 tablet by mouth daily. Alive     oxcarbazepine (TRILEPTAL) 600 MG tablet Take 600 mg by mouth 2 (two) times daily.     pantoprazole (PROTONIX) 40 MG tablet Take 1 tablet (40 mg total) by mouth daily. 90 tablet 4   tamsulosin (FLOMAX) 0.4 MG CAPS capsule Take 1 capsule (0.4 mg total) by mouth daily. 90 capsule 3   No current facility-administered medications for this visit.     Physical Exam: Blood pressure 128/77, pulse 64, resp. rate 18, height 5\' 10"  (1.778 m), weight 175 lb (79.4 kg), SpO2 96%.         Exam    General- alert and comfortable    Neck- no JVD, no cervical adenopathy palpable, no carotid bruit   Lungs- clear without rales, wheezes   Cor- regular rate and rhythm, slight systolic flow murmur , no gallop   Abdomen- soft, non-tender   Extremities - warm, non-tender, minimal edema   Neuro- oriented, appropriate, no focal weakness  Diagnostic Tests: CT images reviewed with the patient as noted  above  Impression: Status post repair acute type a ascending aortic dissection 2021. Persistent patency of the false lumen beyond the distal anastomosis of the graft with slight enlargement of the total aortic diameter to 5.2 cm in the proximal descending thoracic aorta  Plan: Surgical repair of the distal arch and proximal descending thoracic aorta would be extremely high risk for  significant morbidity /mortality, probably greater than the risk of conservative management at this point.  I have recommended to the patient that he stop all exertional activities  including push-ups and lifting weights above his head and straining to go up hills on his bike rides.  Walking and stretching and avoidance of exertional activities would be best. Indication for the Eliquis appears to be borderline and I would recommend stopping Eliquis to help false lumen thrombosis and remodeling of the aortic wall.  Aspirin 81 mg daily would be appropriate.  We will recheck his scan in 6 months or sooner if he develops any symptoms of back pain or significant chest pain.   Lovett Sox, MD [facility] [phone]

## 2023-02-05 ENCOUNTER — Ambulatory Visit: Payer: Medicare Other | Admitting: Cardiology

## 2023-02-05 ENCOUNTER — Encounter: Payer: Self-pay | Admitting: Cardiology

## 2023-02-05 VITALS — BP 153/97 | HR 57 | Ht 70.0 in | Wt 175.0 lb

## 2023-02-05 VITALS — BP 149/93 | HR 55

## 2023-02-05 DIAGNOSIS — I351 Nonrheumatic aortic (valve) insufficiency: Secondary | ICD-10-CM

## 2023-02-05 DIAGNOSIS — I10 Essential (primary) hypertension: Secondary | ICD-10-CM

## 2023-02-05 DIAGNOSIS — Z9889 Other specified postprocedural states: Secondary | ICD-10-CM

## 2023-02-05 NOTE — Progress Notes (Signed)
Patient referred by Joaquim Nam, MD for aortic dissection  Subjective:   Juan Wagner, male    DOB: 1950/11/04, 72 y.o.   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 Donata Clay. There has been increase in false lumen size to 5.2 cm. Dr. Donata Clay suggested considering stopping eliquis to hasten thrombosing of the false lumen.   Patient has been doing well. I looked through his chart in details. His only documented Afib was post op after aorta surgery in 05/2020. He was not on anticoagulation on discharge, but was started on eliquis after diagnosis of DVT and has stayed on it since then. He did wear cardiac monitor in 2022, but was not found to have Afib.     Current Outpatient Medications:    amLODipine (NORVASC) 10 MG tablet, Take 1 tablet (10 mg total) by mouth daily., Disp: 90 tablet, Rfl: 3   apixaban (ELIQUIS) 5 MG TABS tablet, Take 1 tablet (5 mg total) by mouth 2 (two) times daily., Disp: 180 tablet, Rfl: 3   atorvastatin (LIPITOR) 10 MG tablet, Take 1 tablet (10 mg total) by mouth daily., Disp: 90 tablet, Rfl: 3   ferrous sulfate 325 (65 FE) MG tablet, TAKE 1 TABLET BY MOUTH EVERY DAY WITH BREAKFAST, Disp: 90 tablet, Rfl: 2   levETIRAcetam (KEPPRA) 500 MG tablet, Take 500 mg by mouth 2 (two) times daily., Disp: , Rfl:    losartan-hydrochlorothiazide (HYZAAR) 100-25 MG tablet, Take 1 tablet by mouth daily., Disp: 90 tablet, Rfl: 3   metoprolol succinate (TOPROL-XL) 100 MG 24 hr tablet, Take 1 tablet (100 mg total) by mouth daily. TAKE WITH OR IMMEDIATELY FOLLOWING A MEAL., Disp: 90 tablet, Rfl: 3   Multiple Vitamin (MULTIVITAMIN) tablet, Take 1 tablet  by mouth daily. Alive, Disp: , Rfl:    oxcarbazepine (TRILEPTAL) 600 MG tablet, Take 600 mg by mouth 2 (two) times daily., Disp: , Rfl:    pantoprazole (PROTONIX) 40 MG tablet, Take 1 tablet (40 mg total) by mouth daily., Disp: 90 tablet, Rfl: 4   tamsulosin (FLOMAX) 0.4 MG CAPS capsule, Take 1 capsule (0.4 mg total) by mouth daily., Disp: 90 capsule, Rfl: 3  Cardiovascular and other pertinent studies:  CTA chest 01/20/2023: Redemonstration of postoperative changes of median sternotomy and tube graft/hemi arch repair of former type A dissection.   Redemonstration dissection flap continuing from the anastomosis of the prior graft repair through the aortic arch and descending thoracic aorta. Similar involvement of the innominate artery, proximal right common carotid artery, and the proximal left subclavian artery as above.   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  Echocardiogram 01/10/2023: Normal LV systolic function with visual EF 55-60%. Left ventricle cavity is normal in size. Moderate concentric hypertrophy of the left ventricle. Normal global wall motion. Normal diastolic filling pattern. Trileaflet aortic valve. Mild (Grade I) aortic regurgitation. Structurally normal mitral valve. Mild (Grade I) mitral regurgitation. Structurally normal tricuspid valve with trace regurgitation. No evidence of pulmonary hypertension. The aortic root is mildly dilated at 4.2 cm. Compared to  the study done on 02/27/2022, no significant change. Ascending aortic dilation was noted previously but not mentioned.  Previously noted RV dilatation and mild decrease in RV function is not present.  Mobile cardiac telemetry 14 days 02/27/2022 - 03/13/2022: Dominant rhythm: Sinus. HR 48-137 bpm. Avg HR 68 bpm, in sinus rhythm. 10 episodes of atrial tachycardia, fastest at 129 bpm for 5 beats,  longest for 6 beats at 101 bpm.  <1% isolated SVE, couplet/triplets. 0 episodes of VT. <1% isolated VE, couplets. No atrial fibrillation/atrial flutter/VT/high grade AV block, sinus pause >3sec noted. 2 patient triggered events, correlated with sinus rhythm.   CT Chest/abdomen 12/31/2021: 1. Stable postoperative appearance of ascending aortic repair. Persistent dissection flap in the arch, right brachiocephalic and proximal common carotid arteries, and proximal descending aorta as before. 2. 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 Recommend semi-annual imaging followup by CTA or MRA and referral to cardiothoracic surgery (already obtained). This recommendation follows 2010 ACCF/AHA/AATS/ACR/ASA/SCA/SCAI/SIR/STS/SVM Guidelines for the Diagnosis and Management of Patients With Thoracic Aortic Disease. Circulation. 2010; 121: e266-e36 3. Dilated common iliac arteries as above.    Op note 05/29/2020 (Dr. Vickey Sages): REPAIR OF ACUTE ASCENDING THORACIC AORTIC DISSECTION USING HEMASHIELD PLATINUM WOVEN DOUBLE VELOUR VASCULAR GRAFT 32 MM X 30 CM (N/A) TRANSESOPHAGEAL ECHOCARDIOGRAM (TEE) (N/A)  Recent labs: 09/09/2022: Glucose 105, BUN/Cr 18/1.1. EGFR 71. Na/K 140/5.1.   02/14/2022: Glucose 91, BUN/Cr 17/1.14. EGFR 64. Na/K 137/4.0. Rest of the CMP normal H/H 12/36. MCV 88. Platelets 196 HbA1C NA Chol 137, TG 60, HDL 63, LDL 61 TSH 1.2 normal  Review of Systems  Cardiovascular:  Negative for chest pain, dyspnea on exertion, leg swelling, palpitations and syncope.         Vitals:   02/05/23 0821  BP: (!) 151/91  Pulse: (!) 59  SpO2: 98%      Body mass index is 25.11 kg/m. Filed Weights   02/05/23 0821  Weight: 175 lb (79.4 kg)     Objective:   Physical Exam Vitals and nursing note reviewed.  Constitutional:      General: He is not in acute distress. Neck:     Vascular: No JVD.   Cardiovascular:     Rate and Rhythm: Normal rate and regular rhythm.     Heart sounds: Murmur heard.     High-pitched blowing decrescendo early diastolic murmur is present with a grade of 1/4 at the upper right sternal border radiating to the apex.  Pulmonary:     Effort: Pulmonary effort is normal.     Breath sounds: Normal breath sounds. No wheezing or rales.  Musculoskeletal:     Right lower leg: No edema.     Left lower leg: No edema.         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 Genetic testing yielded no variant associated with familial aortopathy. False lumen increased to 5.3 cm. With no documented Afib since postop period in 2021, DVT in 05/2020 likely provoked post operatively, I think discontinuing eliquis has better benefit than risk of stroke. I recommend stopping Eliquis.  Hypertension: Generally well controlled. BP high today. Monitor BP at home. If SBP stays >140 mmHg, may need additional therapy. Continue  losartan-HCTZ 100-25 mg daily, metoprolol succinate 100 mg daily.  Aortic regurgitation: Mild, stable.  H/o DVT: H/o right gastrocnemius DVT, likely related to immobility post surgery. Remains on Eliquis given paroxysmal A. Fib.  F/u in 6 months   Elder Negus, MD Pager: [phone] Office: [phone]

## 2023-02-05 NOTE — Progress Notes (Signed)
Vitals:   02/05/23 0955  BP: (!) 149/93  Pulse: (!) 55   Please see office visit note same day.   Elder Negus, MD Pager: [phone] Office: [phone]

## 2023-02-07 ENCOUNTER — Other Ambulatory Visit: Payer: Self-pay | Admitting: Family Medicine

## 2023-02-07 DIAGNOSIS — Z9889 Other specified postprocedural states: Secondary | ICD-10-CM

## 2023-02-09 ENCOUNTER — Other Ambulatory Visit: Payer: Self-pay | Admitting: Family Medicine

## 2023-02-09 DIAGNOSIS — I10 Essential (primary) hypertension: Secondary | ICD-10-CM

## 2023-02-09 DIAGNOSIS — Z125 Encounter for screening for malignant neoplasm of prostate: Secondary | ICD-10-CM

## 2023-02-09 DIAGNOSIS — D649 Anemia, unspecified: Secondary | ICD-10-CM

## 2023-02-12 ENCOUNTER — Ambulatory Visit: Payer: Medicare Other | Admitting: Cardiology

## 2023-02-12 NOTE — Telephone Encounter (Signed)
Contacted patient to remind him to have H Pylori stool testing completed at this time. He does confirm that he has been holding his PPI x 2 weeks.

## 2023-02-13 ENCOUNTER — Other Ambulatory Visit: Payer: Medicare Other

## 2023-02-14 ENCOUNTER — Other Ambulatory Visit: Payer: Self-pay

## 2023-02-14 DIAGNOSIS — A048 Other specified bacterial intestinal infections: Secondary | ICD-10-CM

## 2023-02-18 ENCOUNTER — Other Ambulatory Visit (INDEPENDENT_AMBULATORY_CARE_PROVIDER_SITE_OTHER): Payer: Medicare Other

## 2023-02-18 DIAGNOSIS — Z125 Encounter for screening for malignant neoplasm of prostate: Secondary | ICD-10-CM

## 2023-02-18 DIAGNOSIS — D649 Anemia, unspecified: Secondary | ICD-10-CM

## 2023-02-18 DIAGNOSIS — I10 Essential (primary) hypertension: Secondary | ICD-10-CM | POA: Diagnosis not present

## 2023-02-18 LAB — CBC WITH DIFFERENTIAL/PLATELET
Basophils Absolute: 0 10*3/uL (ref 0.0–0.1)
Basophils Relative: 0.8 % (ref 0.0–3.0)
Eosinophils Absolute: 0.1 10*3/uL (ref 0.0–0.7)
Eosinophils Relative: 1.6 % (ref 0.0–5.0)
HCT: 38 % — ABNORMAL LOW (ref 39.0–52.0)
Hemoglobin: 12.2 g/dL — ABNORMAL LOW (ref 13.0–17.0)
Lymphocytes Relative: 21 % (ref 12.0–46.0)
Lymphs Abs: 0.8 10*3/uL (ref 0.7–4.0)
MCHC: 32 g/dL (ref 30.0–36.0)
MCV: 90.6 fl (ref 78.0–100.0)
Monocytes Absolute: 0.8 10*3/uL (ref 0.1–1.0)
Monocytes Relative: 20 % — ABNORMAL HIGH (ref 3.0–12.0)
Neutro Abs: 2.1 10*3/uL (ref 1.4–7.7)
Neutrophils Relative %: 56.6 % (ref 43.0–77.0)
Platelets: 203 10*3/uL (ref 150.0–400.0)
RBC: 4.19 Mil/uL — ABNORMAL LOW (ref 4.22–5.81)
RDW: 13.5 % (ref 11.5–15.5)
WBC: 3.8 10*3/uL — ABNORMAL LOW (ref 4.0–10.5)

## 2023-02-18 LAB — IRON: Iron: 69 ug/dL (ref 42–165)

## 2023-02-18 LAB — LIPID PANEL
Cholesterol: 147 mg/dL (ref 0–200)
HDL: 67.4 mg/dL (ref >39.00)
LDL Cholesterol: 70 mg/dL (ref 0–99)
NonHDL: 79.59
Total CHOL/HDL Ratio: 2
Triglycerides: 50 mg/dL (ref 0.0–149.0)
VLDL: 10 mg/dL (ref 0.0–40.0)

## 2023-02-18 LAB — COMPREHENSIVE METABOLIC PANEL
ALT: 24 U/L (ref 0–53)
AST: 24 U/L (ref 0–37)
Albumin: 4 g/dL (ref 3.5–5.2)
Alkaline Phosphatase: 90 U/L (ref 39–117)
BUN: 15 mg/dL (ref 6–23)
CO2: 31 mEq/L (ref 19–32)
Calcium: 9.4 mg/dL (ref 8.4–10.5)
Chloride: 96 mEq/L (ref 96–112)
Creatinine, Ser: 1.17 mg/dL (ref 0.40–1.50)
GFR: 61.85 mL/min (ref >60.00)
Glucose, Bld: 101 mg/dL — ABNORMAL HIGH (ref 70–99)
Potassium: 3.6 mEq/L (ref 3.5–5.1)
Sodium: 135 mEq/L (ref 135–145)
Total Bilirubin: 0.5 mg/dL (ref 0.2–1.2)
Total Protein: 7.3 g/dL (ref 6.0–8.3)

## 2023-02-18 LAB — PSA, MEDICARE: PSA: 1.49 ng/ml (ref 0.10–4.00)

## 2023-02-18 LAB — TSH: TSH: 1.47 u[IU]/mL (ref 0.35–5.50)

## 2023-02-25 ENCOUNTER — Encounter: Payer: Self-pay | Admitting: Family Medicine

## 2023-02-25 ENCOUNTER — Ambulatory Visit (INDEPENDENT_AMBULATORY_CARE_PROVIDER_SITE_OTHER): Payer: Medicare Other | Admitting: Family Medicine

## 2023-02-25 VITALS — BP 118/80 | HR 67 | Temp 98.4°F | Ht 70.0 in | Wt 169.0 lb

## 2023-02-25 DIAGNOSIS — I10 Essential (primary) hypertension: Secondary | ICD-10-CM

## 2023-02-25 DIAGNOSIS — E785 Hyperlipidemia, unspecified: Secondary | ICD-10-CM | POA: Diagnosis not present

## 2023-02-25 DIAGNOSIS — I71019 Dissection of thoracic aorta, unspecified: Secondary | ICD-10-CM

## 2023-02-25 DIAGNOSIS — Z7189 Other specified counseling: Secondary | ICD-10-CM

## 2023-02-25 DIAGNOSIS — Z9889 Other specified postprocedural states: Secondary | ICD-10-CM

## 2023-02-25 DIAGNOSIS — R351 Nocturia: Secondary | ICD-10-CM | POA: Diagnosis not present

## 2023-02-25 DIAGNOSIS — Z0000 Encounter for general adult medical examination without abnormal findings: Secondary | ICD-10-CM

## 2023-02-25 DIAGNOSIS — R569 Unspecified convulsions: Secondary | ICD-10-CM

## 2023-02-25 MED ORDER — TAMSULOSIN HCL 0.4 MG PO CAPS: 0.4000 mg | ORAL_CAPSULE | Freq: Every day | ORAL | 3 refills | Status: DC

## 2023-02-25 MED ORDER — AMLODIPINE BESYLATE 10 MG PO TABS: 10.0000 mg | ORAL_TABLET | Freq: Every day | ORAL | 3 refills | Status: DC

## 2023-02-25 MED ORDER — METOPROLOL SUCCINATE ER 100 MG PO TB24: 100.0000 mg | ORAL_TABLET | Freq: Every day | ORAL | 3 refills | Status: DC

## 2023-02-25 MED ORDER — ATORVASTATIN CALCIUM 10 MG PO TABS: 10.0000 mg | ORAL_TABLET | Freq: Every day | ORAL | 3 refills | Status: DC

## 2023-02-25 MED ORDER — LOSARTAN POTASSIUM-HCTZ 100-25 MG PO TABS: 1.0000 | ORAL_TABLET | Freq: Every day | ORAL | 3 refills | Status: DC

## 2023-02-25 NOTE — Patient Instructions (Signed)
Don't change you meds for now.  Thanks for your effort.  Take care.  Glad to see you.

## 2023-02-25 NOTE — Progress Notes (Unsigned)
I have personally reviewed the Medicare Annual Wellness questionnaire and have noted 1. The patient's medical and social history 2. Their use of alcohol, tobacco or illicit drugs 3. Their current medications and supplements 4. The patient's functional ability including ADL's, fall risks, home safety risks and hearing or visual             impairment. 5. Diet and physical activities 6. Evidence for depression or mood disorders  The patients weight, height, BMI have been recorded in the chart and visual acuity is per eye clinic.  I have made referrals, counseling and provided education to the patient based review of the above and I have provided the pt with a written personalized care plan for preventive services.  Provider list updated- see scanned forms.  Routine anticipatory guidance given to patient.  See health maintenance. The possibility exists that previously documented standard health maintenance information may have been brought forward from a previous encounter into this note.  If needed, that same information has been updated to reflect the current situation based on today's encounter.    Flu Shingles PNA Tetanus Colon  Breast cancer screening Prostate cancer screening Advance directive Cognitive function addressed- see scanned forms- and if abnormal then additional documentation follows.   In addition to [facility], follow up visit for the below conditions:  He talked to vascular and cardiology about his exercise routine.    He noted occ raspy voice since his prior surgery. Swallowing well w/o GERD.  No escalation of sx.    Nocturia improved with flomax.    Hypertension:    Using medication without problems or lightheadedness: yes Chest pain with exertion:no Edema:no Short of breath:no  No SZ, on trileptal per neuro.    Elevated Cholesterol: Using medications without problems:yes Muscle aches: no Diet compliance: yes Exercise: yes Labs d/w pt.   PMH  and SH reviewed  Meds, vitals, and allergies reviewed.   ROS: Per HPI.  Unless specifically indicated otherwise in HPI, the patient denies:  General: fever. Eyes: acute vision changes ENT: sore throat Cardiovascular: chest pain Respiratory: SOB GI: vomiting GU: dysuria Musculoskeletal: acute back pain Derm: acute rash Neuro: acute motor dysfunction Psych: worsening mood Endocrine: polydipsia Heme: bleeding Allergy: hayfever  GEN: nad, alert and oriented HEENT: mucous membranes moist NECK: supple w/o LA CV: rrr. PULM: ctab, no inc wob ABD: soft, +bs EXT: no edema SKIN: no acute rash

## 2023-02-26 NOTE — Assessment & Plan Note (Signed)
Continue Trileptal per neurology.  No recent events.

## 2023-02-26 NOTE — Assessment & Plan Note (Signed)
Nocturia improved with flomax.   Would continue with this.

## 2023-02-26 NOTE — Assessment & Plan Note (Signed)
Continue atorvastatin.  Labs discussed with patient.  Healthy diet and exercise.

## 2023-02-26 NOTE — Assessment & Plan Note (Signed)
Per vascular and cardiology.

## 2023-02-26 NOTE — Assessment & Plan Note (Signed)
Flu can be done this fall. Shingles discussed with patient PNA deferred given his previous reaction Tetanus previously done COVID-vaccine previously done Colonoscopy 2024 Prostate cancer screening 2024 Advance directive-children designated equally if patient were incapacitated.  Then wife designated if needed. Cognitive function addressed- see scanned forms- and if abnormal then additional documentation follows.

## 2023-02-26 NOTE — Assessment & Plan Note (Signed)
He talked to vascular and cardiology about his exercise routine.   Routine cautions given to patient.  Continue amlodipine losartan hydrochlorothiazide and metoprolol.

## 2023-02-26 NOTE — Assessment & Plan Note (Signed)
Advance directive-children equally designated if patient were incapacitated.  Then wife designated if needed.

## 2023-03-05 ENCOUNTER — Telehealth: Payer: Self-pay | Admitting: Family Medicine

## 2023-03-05 NOTE — Telephone Encounter (Signed)
Please update patient.  Cardiology agreed.  He does not need to take extra aspirin.  Thanks.

## 2023-03-06 NOTE — Telephone Encounter (Signed)
Called spoke to wife ( on dpr) information given will have patient call office if any questions.

## 2023-05-06 ENCOUNTER — Other Ambulatory Visit: Payer: Self-pay | Admitting: Family Medicine

## 2023-05-06 DIAGNOSIS — I82509 Chronic embolism and thrombosis of unspecified deep veins of unspecified lower extremity: Secondary | ICD-10-CM

## 2023-05-07 NOTE — Telephone Encounter (Signed)
Eliquis was prev stopped.  I denied the rx.

## 2023-07-01 ENCOUNTER — Other Ambulatory Visit: Payer: Self-pay | Admitting: Cardiothoracic Surgery

## 2023-07-01 DIAGNOSIS — I7101 Dissection of ascending aorta: Secondary | ICD-10-CM

## 2023-07-28 ENCOUNTER — Ambulatory Visit: Payer: Medicare Other | Admitting: Cardiothoracic Surgery

## 2023-07-28 ENCOUNTER — Other Ambulatory Visit: Payer: Medicare Other

## 2023-08-14 ENCOUNTER — Ambulatory Visit
Admission: RE | Admit: 2023-08-14 | Discharge: 2023-08-14 | Disposition: A | Discharge status: Home / Self Care | Payer: Medicare Other | Source: Ambulatory Visit | Attending: Cardiothoracic Surgery | Admitting: Cardiothoracic Surgery

## 2023-08-14 DIAGNOSIS — I7101 Dissection of ascending aorta: Secondary | ICD-10-CM

## 2023-08-14 MED ORDER — IOPAMIDOL (ISOVUE-370) INJECTION 76%
75.0000 mL | Freq: Once | INTRAVENOUS | Status: AC | PRN
  Administered 2023-08-14: 75 mL via INTRAVENOUS

## 2023-08-27 ENCOUNTER — Ambulatory Visit: Payer: Medicare Other | Admitting: Surgery

## 2023-08-27 ENCOUNTER — Encounter: Payer: Self-pay | Admitting: Surgery

## 2023-08-27 VITALS — BP 153/88 | HR 56 | Resp 18 | Ht 70.0 in

## 2023-08-27 DIAGNOSIS — I7122 Aneurysm of the aortic arch, without rupture: Secondary | ICD-10-CM | POA: Diagnosis not present

## 2023-08-27 DIAGNOSIS — I7101 Dissection of ascending aorta: Secondary | ICD-10-CM | POA: Diagnosis not present

## 2023-08-27 NOTE — Progress Notes (Signed)
 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 German in 2021.  He has subsequently been followed by Dr. Obadiah until his recent retirement.  He was last seen by him on 02/03/2023.  CTA of the chest at that time showed persistent patency of the false lumen beyond the distal anastomosis of the graft with slight enlargement of the aortic diameter to 5.2 cm in the proximal descending aorta.  He had been on Eliquis  for several years due to some postoperative transient A-fib and postoperative DVT felt to probably be related to use of factor VII for coagulopathy during the dissection repair.  Dr. Obadiah felt he should get off Eliquis  to try to promote thrombosis of the false lumen and remodeling of the aorta.  The patient said that he stopped the Eliquis .  He continues to feel well without chest or back pain.  He has had no shortness of breath.  He is here today with his son.  Current Outpatient Medications  Medication Sig Dispense Refill   amLODipine  (NORVASC ) 10 MG tablet Take 1 tablet (10 mg total) by mouth daily. 90 tablet 3   atorvastatin  (LIPITOR) 10 MG tablet Take 1 tablet (10 mg total) by mouth daily. 90 tablet 3   ferrous sulfate  325 (65 FE) MG tablet TAKE 1 TABLET BY MOUTH EVERY DAY WITH BREAKFAST 90 tablet 2   losartan -hydrochlorothiazide  (HYZAAR) 100-25 MG tablet Take 1 tablet by mouth daily. 90 tablet 3   metoprolol  succinate (TOPROL -XL) 100 MG 24 hr tablet Take 1 tablet (100 mg total) by mouth daily. TAKE WITH OR IMMEDIATELY FOLLOWING A MEAL. 90 tablet 3   Multiple Vitamin (MULTIVITAMIN) tablet Take 1 tablet by mouth daily. Alive     oxcarbazepine  (TRILEPTAL ) 600 MG tablet Take 600 mg by mouth 2 (two) times daily.     pantoprazole  (PROTONIX ) 40 MG tablet Take 1 tablet (40 mg total) by mouth daily. 90 tablet 4   tamsulosin  (FLOMAX ) 0.4 MG CAPS capsule Take 1 capsule (0.4 mg total) by mouth daily. 90 capsule 3    No current facility-administered medications for this visit.     Physical Exam: BP (!) 153/88   Pulse (!) 56   Resp 18   Ht 5' 10 (1.778 m)   SpO2 99%   BMI 24.25 kg/m  He looks well. Cardiac exam shows a regular rate and rhythm with a soft systolic flow murmur.  There is no diastolic murmur. Lungs are clear. The chest incision is well-healed and the sternum is stable. There is no peripheral edema.  Diagnostic Tests:  Narrative & Impression  CLINICAL DATA:  Aortic aneurysm suspected   Follow up thoracic aortic dissection.   EXAM: CT ANGIOGRAPHY CHEST WITH CONTRAST   TECHNIQUE: Multidetector CT imaging of the chest was performed using the standard protocol during bolus administration of intravenous contrast. Multiplanar CT image reconstructions and MIPs were obtained to evaluate the vascular anatomy.   RADIATION DOSE REDUCTION: This exam was performed according to the departmental dose-optimization program which includes automated exposure control, adjustment of the mA and/or kV according to patient size and/or use of iterative reconstruction technique.   CONTRAST:  75mL ISOVUE -370 IOPAMIDOL  (ISOVUE -370) INJECTION 76%   COMPARISON:  Prior CT 01/20/2023 and 12/31/2021.   FINDINGS: Cardiovascular: Stable postsurgical changes from previous median sternotomy, ascending aortic grafting and repair of a type A aortic dissection. The ascending aortic graft appears unchanged. There has been very little  change in the appearance of the residual chronic aortic dissection which begins just distal to the graft and extends into the mid descending aorta. Dissection flap extends into the innominate and left subclavian arteries as before. There is a proximal fenestration with resulting opacification of the false lumen in the aortic arch. There is slightly more mural thrombus within the false lumen of the aortic arch, and the overall diameter of the arch has slightly increased,  now measuring 5.1 cm on axial image 40/8 (previously 4.7 cm) and 5.7 cm on coronal image 81/13 (previously 5.4 cm). No evidence of acute intramural thrombus. The descending aorta is normal in caliber. The great vessels remain patent. The pulmonary arteries are patent. The heart size is normal. No significant pericardial fluid.   Mediastinum/Nodes: There are no enlarged mediastinal, hilar or axillary lymph nodes. The thyroid  gland, trachea and esophagus demonstrate no significant findings.   Lungs/Pleura: No pleural effusion or pneumothorax. Stable mild scarring at both lung apices. The lungs are otherwise clear. No suspicious pulmonary nodularity.   Upper abdomen: No acute findings are seen in the visualized upper abdomen. There is no extension of the dissection into the abdominal aorta.   Musculoskeletal/Chest wall: There is no chest wall mass or suspicious osseous finding. Healed median sternotomy. Minimal spondylosis.   Review of the MIP images confirms the above findings.   IMPRESSION: 1. Stable postsurgical changes from previous ascending aortic grafting and repair of a type A aortic dissection. 2. Slightly more mural thrombus within the false lumen of the aortic arch, and slightly increased diameter of the aortic arch, now measuring up to 5.7 cm (previously 5.4 cm). No evidence of acute intramural thrombus. Residual dissection flap otherwise appears unchanged, without large vessel occlusion. 3. No other significant changes.     Electronically Signed   By: Elsie Perone M.D.   On: 08/14/2023 14:47      Impression:  He has had continued progressive enlargement of the distal aortic arch and proximal descending thoracic aorta to about 5.7 cm.  The aorta measured about 5.4 cm in this location in July 2024 and going back to his scan at the time of presentation in 2021 the same area measured about 4.2 cm.  There is slightly more mural thrombus present within the false  lumen of the aortic arch.  The residual dissection flap is unchanged and extends down to the mid descending thoracic aorta.  I reviewed the CTA images with the patient and his son and answered their questions.  I think there is a good chance that this will continue to increase in size and may well require surgical treatment.  I think this would probably require redo sternotomy for aortic arch de-branching and TEVAR across the aortic arch.  I do not think surgical replacement of the aortic arch is possible since there is no distal site for a graft anastomosis due to the large size.  Plan:  I recommended a repeat CTA of the chest, abdomen, and pelvis in 6 months.  I will schedule a follow-up visit with Dr. Serene to get another opinion since surgery will likely be required in the not-too-distant future. I stressed the importance of continued good blood pressure control in preventing further enlargement and acute aortic dissection.  I advised him against doing any heavy lifting that may require a Valsalva maneuver and could suddenly raise his blood pressure to high levels.    I spent 20 minutes performing this established patient evaluation and > 50% of this  time was spent face to face counseling and coordinating the care of this patient's aortic aneurysm.    Dorise MARLA Fellers, MD [facility] [phone]

## 2023-09-08 ENCOUNTER — Ambulatory Visit: Payer: Self-pay | Admitting: Cardiology

## 2023-09-09 ENCOUNTER — Encounter: Payer: Self-pay | Admitting: Cardiology

## 2023-09-09 ENCOUNTER — Ambulatory Visit: Payer: Medicare Other | Attending: Cardiology | Admitting: Cardiology

## 2023-09-09 VITALS — BP 132/86 | HR 57 | Ht 70.0 in | Wt 180.6 lb

## 2023-09-09 DIAGNOSIS — I10 Essential (primary) hypertension: Secondary | ICD-10-CM | POA: Diagnosis not present

## 2023-09-09 DIAGNOSIS — I7122 Aneurysm of the aortic arch, without rupture: Secondary | ICD-10-CM | POA: Insufficient documentation

## 2023-09-09 DIAGNOSIS — Z9889 Other specified postprocedural states: Secondary | ICD-10-CM

## 2023-09-09 DIAGNOSIS — I351 Nonrheumatic aortic (valve) insufficiency: Secondary | ICD-10-CM | POA: Diagnosis not present

## 2023-09-09 NOTE — Patient Instructions (Signed)
 Medication Instructions:   Your physician recommends that you continue on your current medications as directed. Please refer to the Current Medication list given to you today.  *If you need a refill on your cardiac medications before your next appointment, please call your pharmacy*    Testing/Procedures:  Your physician has requested that you have an echocardiogram. Echocardiography is a painless test that uses sound waves to create images of your heart. It provides your doctor with information about the size and shape of your heart and how well your heart's chambers and valves are working. This procedure takes approximately one hour. There are no restrictions for this procedure.  DO ECHO IN 6 MONTHS PER DR. PATWARDHAN  Please do NOT wear cologne, perfume, aftershave, or lotions (deodorant is allowed). Please arrive 15 minutes prior to your appointment time.  Please note: We ask at that you not bring children with you during ultrasound (echo/ vascular) testing. Due to room size and safety concerns, children are not allowed in the ultrasound rooms during exams. Our front office staff cannot provide observation of children in our lobby area while testing is being conducted. An adult accompanying a patient to their appointment will only be allowed in the ultrasound room at the discretion of the ultrasound technician under special circumstances. We apologize for any inconvenience.    Follow-Up: At [facility], you and your health needs are our priority.  As part of our continuing mission to provide you with exceptional heart care, we have created designated Provider Care Teams.  These Care Teams include your primary Cardiologist (physician) and Advanced Practice Providers (APPs -  Physician Assistants and Nurse Practitioners) who all work together to provide you with the care you need, when you need it.  We recommend signing up for the patient portal called "MyChart".  Sign up  information is provided on this After Visit Summary.  MyChart is used to connect with patients for Virtual Visits (Telemedicine).  Patients are able to view lab/test results, encounter notes, upcoming appointments, etc.  Non-urgent messages can be sent to your provider as well.   To learn more about what you can do with MyChart, go to [email or web address].    Your next appointment:   6 month(s)  Provider:   DR. Rosemary Holms

## 2023-09-09 NOTE — Progress Notes (Signed)
 Cardiology Office Note:  .   Date:  09/09/2023  ID:  Juan Wagner, DOB 13-Feb-1951, MRN [id number] PCP: Joaquim Nam, MD  [facility] Providers Cardiologist:  Truett Mainland, MD PCP: Joaquim Nam, MD  Chief Complaint  Patient presents with   S/P aortic dissection repair   Follow-up      History of Present Illness: .    Juan Wagner is a 73 y.o. 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 Laneta Simmers few days ago.  Dr. Laneta Simmers noted progressive enlargement of distal aortic arch and proximal descending aorta to about 5.7 cm, along with slightly more mural thrombus present within the false lumen of aortic arch.  He feels that patient would need surgery in the not-too-distant future.  Dr. Laneta Simmers is going to discuss with Dr. Myra Gianotti regarding the same.  He is going to have repeat CT chest in 76-months.  Patient is otherwise doing well.  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  He has been cautious about avoiding weightlifting, does regular walking and light jogging without any symptoms.   Vitals:   09/09/23 0942  BP: 132/86  Pulse: (!) 57  SpO2: 98%     ROS:  Review of Systems  Cardiovascular:  Negative for chest pain, dyspnea on exertion, leg swelling, palpitations and syncope.     Studies Reviewed: Marland Kitchen        EKG 09/09/2023: Sinus rhythm 54 bpm First degree A-V block Left axis deviation Left ventricular hypertrophy with QRS widening ( R in aVL , Cornell product , Romhilt-Estes ) Nonspecific T wave abnormality When compared with ECG of 03-Jun-2020 05:32, PR interval has increased Vent. rate has decreased BY  65 BPM ST no longer elevated in Inferior leads ST no longer elevated in Anterolateral leads Nonspecific T wave abnormality has replaced inverted T waves in Lateral  leads     Independently interpreted 01/2023: Chol 147, TG 50, HDL 67, LDL 70 Hb 12.2 Cr 1.47, eGFR 61   CTA chest aorta 07/2023: 1. Stable postsurgical changes from previous ascending aortic grafting and repair of a type A aortic dissection. 2. Slightly more mural thrombus within the false lumen of the aortic arch, and slightly increased diameter of the aortic arch, now measuring up to 5.7 cm (previously 5.4 cm). No evidence of acute intramural thrombus. Residual dissection flap otherwise appears unchanged, without large vessel occlusion. 3. No other significant changes.       Physical Exam:   Physical Exam Vitals and nursing note reviewed.  Constitutional:      General: He is not in acute distress. Neck:     Vascular: No JVD.  Cardiovascular:     Rate and Rhythm: Normal rate and regular rhythm.     Heart sounds: Murmur heard.     High-pitched blowing holosystolic murmur is present with a grade of 2/6 at the apex.     High-pitched blowing decrescendo early diastolic murmur is present with a grade of 2/4 at the upper right sternal border radiating to the apex.  Pulmonary:     Effort: Pulmonary effort is normal.     Breath sounds: Normal breath sounds. No wheezing or rales.  Musculoskeletal:     Right lower leg: No edema.     Left lower leg: No edema.  VISIT DIAGNOSES:   ICD-10-CM   1. 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 Primary hypertension  I10     3. S/P aortic dissection repair  Z98.890 ECHOCARDIOGRAM COMPLETE    4. Nonrheumatic aortic valve insufficiency  I35.1 ECHOCARDIOGRAM COMPLETE       ASSESSMENT AND PLAN: .    Juan Wagner is a 73 y.o. 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 Genetic testing yielded no variant associated with familial aortopathy. Continued increase in aorta size in distal aortic arch and ascending aorta, up to 5.7 cm. Possibility of surgery in the not-too-distant future, recommendations as per Dr. Laneta Simmers and Dr. Myra Gianotti. Repeat CT chest aorta in 6 months. Avoid heavy weightlifting.  Okay to continue aerobic activity. With no documented Afib since postop period in 2021, DVT in 05/2020 likely provoked post operatively, I discontinued eliquis in 01/2023.   Hypertension: Fairly well controlled.  Continue losartan-HCTZ 100-25 mg daily, metoprolol succinate 100 mg daily.   Aortic regurgitation: Mild, stable. We will obtain echocardiogram in 6 months, coinciding with his upcoming CT chest aorta.  H/o DVT: H/o right gastrocnemius DVT, likely related to immobility post surgery. Completed anticoagulation for provoked DVT.      F/u in 6 months  Signed, Elder Negus, MD

## 2023-11-14 ENCOUNTER — Other Ambulatory Visit: Payer: Self-pay | Admitting: Family Medicine

## 2023-11-14 DIAGNOSIS — D649 Anemia, unspecified: Secondary | ICD-10-CM

## 2024-01-28 ENCOUNTER — Other Ambulatory Visit: Payer: Self-pay | Admitting: Surgery

## 2024-01-28 DIAGNOSIS — I7122 Aneurysm of the aortic arch, without rupture: Secondary | ICD-10-CM

## 2024-01-28 DIAGNOSIS — I7101 Dissection of ascending aorta: Secondary | ICD-10-CM

## 2024-02-05 ENCOUNTER — Other Ambulatory Visit: Payer: Self-pay | Admitting: Surgery

## 2024-02-05 DIAGNOSIS — I7122 Aneurysm of the aortic arch, without rupture: Secondary | ICD-10-CM

## 2024-02-10 ENCOUNTER — Other Ambulatory Visit: Payer: Self-pay | Admitting: Family Medicine

## 2024-02-10 DIAGNOSIS — D649 Anemia, unspecified: Secondary | ICD-10-CM

## 2024-02-15 ENCOUNTER — Other Ambulatory Visit: Payer: Self-pay | Admitting: Family Medicine

## 2024-02-15 DIAGNOSIS — D649 Anemia, unspecified: Secondary | ICD-10-CM

## 2024-02-15 DIAGNOSIS — Z125 Encounter for screening for malignant neoplasm of prostate: Secondary | ICD-10-CM

## 2024-02-15 DIAGNOSIS — I10 Essential (primary) hypertension: Secondary | ICD-10-CM

## 2024-02-18 ENCOUNTER — Ambulatory Visit (HOSPITAL_COMMUNITY)
Admission: RE | Admit: 2024-02-18 | Discharge: 2024-02-18 | Disposition: A | Discharge status: Home / Self Care | Source: Ambulatory Visit | Attending: Cardiovascular Disease | Admitting: Cardiovascular Disease

## 2024-02-18 DIAGNOSIS — I7122 Aneurysm of the aortic arch, without rupture: Secondary | ICD-10-CM

## 2024-02-18 DIAGNOSIS — I7101 Dissection of ascending aorta: Secondary | ICD-10-CM | POA: Diagnosis present

## 2024-02-18 MED ORDER — IOHEXOL 350 MG/ML SOLN
75.0000 mL | Freq: Once | INTRAVENOUS | Status: AC | PRN
  Administered 2024-02-18: 75 mL via INTRAVENOUS

## 2024-02-19 ENCOUNTER — Other Ambulatory Visit (INDEPENDENT_AMBULATORY_CARE_PROVIDER_SITE_OTHER): Payer: Medicare Other

## 2024-02-19 DIAGNOSIS — I10 Essential (primary) hypertension: Secondary | ICD-10-CM

## 2024-02-19 DIAGNOSIS — Z125 Encounter for screening for malignant neoplasm of prostate: Secondary | ICD-10-CM | POA: Diagnosis not present

## 2024-02-19 DIAGNOSIS — D649 Anemia, unspecified: Secondary | ICD-10-CM | POA: Diagnosis not present

## 2024-02-19 LAB — CBC WITH DIFFERENTIAL/PLATELET
Basophils Absolute: 0 K/uL (ref 0.0–0.1)
Basophils Relative: 1 % (ref 0.0–3.0)
Eosinophils Absolute: 0.1 K/uL (ref 0.0–0.7)
Eosinophils Relative: 1.8 % (ref 0.0–5.0)
HCT: 37.5 % — ABNORMAL LOW (ref 39.0–52.0)
Hemoglobin: 12.4 g/dL — ABNORMAL LOW (ref 13.0–17.0)
Lymphocytes Relative: 20.5 % (ref 12.0–46.0)
Lymphs Abs: 0.7 K/uL (ref 0.7–4.0)
MCHC: 33 g/dL (ref 30.0–36.0)
MCV: 89 fl (ref 78.0–100.0)
Monocytes Absolute: 0.6 K/uL (ref 0.1–1.0)
Monocytes Relative: 17.9 % — ABNORMAL HIGH (ref 3.0–12.0)
Neutro Abs: 1.9 K/uL (ref 1.4–7.7)
Neutrophils Relative %: 58.8 % (ref 43.0–77.0)
Platelets: 188 K/uL (ref 150.0–400.0)
RBC: 4.21 Mil/uL — ABNORMAL LOW (ref 4.22–5.81)
RDW: 13.3 % (ref 11.5–15.5)
WBC: 3.3 K/uL — ABNORMAL LOW (ref 4.0–10.5)

## 2024-02-19 LAB — LIPID PANEL
Cholesterol: 153 mg/dL (ref 0–200)
HDL: 74.3 mg/dL (ref >39.00)
LDL Cholesterol: 69 mg/dL (ref 0–99)
NonHDL: 78.54
Total CHOL/HDL Ratio: 2
Triglycerides: 50 mg/dL (ref 0.0–149.0)
VLDL: 10 mg/dL (ref 0.0–40.0)

## 2024-02-19 LAB — COMPREHENSIVE METABOLIC PANEL WITH GFR
ALT: 17 U/L (ref 0–53)
AST: 21 U/L (ref 0–37)
Albumin: 4.1 g/dL (ref 3.5–5.2)
Alkaline Phosphatase: 83 U/L (ref 39–117)
BUN: 21 mg/dL (ref 6–23)
CO2: 33 meq/L — ABNORMAL HIGH (ref 19–32)
Calcium: 9.5 mg/dL (ref 8.4–10.5)
Chloride: 99 meq/L (ref 96–112)
Creatinine, Ser: 1.42 mg/dL (ref 0.40–1.50)
GFR: 48.68 mL/min — ABNORMAL LOW (ref >60.00)
Glucose, Bld: 91 mg/dL (ref 70–99)
Potassium: 3.7 meq/L (ref 3.5–5.1)
Sodium: 139 meq/L (ref 135–145)
Total Bilirubin: 0.5 mg/dL (ref 0.2–1.2)
Total Protein: 7.6 g/dL (ref 6.0–8.3)

## 2024-02-19 LAB — IRON: Iron: 79 ug/dL (ref 42–165)

## 2024-02-19 LAB — TSH: TSH: 1.94 u[IU]/mL (ref 0.35–5.50)

## 2024-02-19 LAB — PSA, MEDICARE: PSA: 1.96 ng/mL (ref 0.10–4.00)

## 2024-02-22 ENCOUNTER — Ambulatory Visit: Payer: Self-pay | Admitting: Family Medicine

## 2024-02-25 ENCOUNTER — Ambulatory Visit (HOSPITAL_COMMUNITY)
Admission: RE | Admit: 2024-02-25 | Discharge: 2024-02-25 | Disposition: A | Discharge status: Home / Self Care | Payer: Medicare Other | Source: Ambulatory Visit | Attending: Cardiovascular Disease | Admitting: Cardiovascular Disease

## 2024-02-25 ENCOUNTER — Ambulatory Visit: Payer: Medicare Other | Attending: Surgery | Admitting: Surgery

## 2024-02-25 ENCOUNTER — Encounter: Payer: Self-pay | Admitting: Surgery

## 2024-02-25 VITALS — BP 140/80 | HR 60 | Resp 20 | Ht 70.0 in | Wt 180.0 lb

## 2024-02-25 DIAGNOSIS — I351 Nonrheumatic aortic (valve) insufficiency: Secondary | ICD-10-CM | POA: Diagnosis present

## 2024-02-25 DIAGNOSIS — Z9889 Other specified postprocedural states: Secondary | ICD-10-CM | POA: Insufficient documentation

## 2024-02-25 DIAGNOSIS — I7122 Aneurysm of the aortic arch, without rupture: Secondary | ICD-10-CM

## 2024-02-25 LAB — ECHOCARDIOGRAM COMPLETE
AR max vel: 4.13 cm2
AV Area VTI: 3.88 cm2
AV Area mean vel: 4.24 cm2
AV Mean grad: 2 mmHg
AV Peak grad: 4 mmHg
Ao pk vel: 1 m/s
S' Lateral: 3.1 cm

## 2024-02-26 ENCOUNTER — Ambulatory Visit (INDEPENDENT_AMBULATORY_CARE_PROVIDER_SITE_OTHER): Payer: Medicare Other | Admitting: Family Medicine

## 2024-02-26 ENCOUNTER — Encounter: Payer: Self-pay | Admitting: Family Medicine

## 2024-02-26 VITALS — BP 136/82 | HR 53 | Temp 98.1°F | Ht 68.75 in | Wt 174.5 lb

## 2024-02-26 DIAGNOSIS — Z0000 Encounter for general adult medical examination without abnormal findings: Secondary | ICD-10-CM

## 2024-02-26 DIAGNOSIS — I10 Essential (primary) hypertension: Secondary | ICD-10-CM

## 2024-02-26 DIAGNOSIS — R3915 Urgency of urination: Secondary | ICD-10-CM

## 2024-02-26 DIAGNOSIS — I71019 Dissection of thoracic aorta, unspecified: Secondary | ICD-10-CM

## 2024-02-26 DIAGNOSIS — R351 Nocturia: Secondary | ICD-10-CM

## 2024-02-26 DIAGNOSIS — R569 Unspecified convulsions: Secondary | ICD-10-CM

## 2024-02-26 DIAGNOSIS — Z9889 Other specified postprocedural states: Secondary | ICD-10-CM | POA: Diagnosis not present

## 2024-02-26 DIAGNOSIS — E785 Hyperlipidemia, unspecified: Secondary | ICD-10-CM | POA: Diagnosis not present

## 2024-02-26 DIAGNOSIS — Z7189 Other specified counseling: Secondary | ICD-10-CM

## 2024-02-26 LAB — BASIC METABOLIC PANEL WITH GFR
BUN: 18 mg/dL (ref 6–23)
CO2: 29 meq/L (ref 19–32)
Calcium: 9.8 mg/dL (ref 8.4–10.5)
Chloride: 96 meq/L (ref 96–112)
Creatinine, Ser: 1.19 mg/dL (ref 0.40–1.50)
GFR: 60.18 mL/min (ref >60.00)
Glucose, Bld: 95 mg/dL (ref 70–99)
Potassium: 3.8 meq/L (ref 3.5–5.1)
Sodium: 137 meq/L (ref 135–145)

## 2024-02-26 MED ORDER — AMLODIPINE BESYLATE 10 MG PO TABS: 10.0000 mg | ORAL_TABLET | Freq: Every day | ORAL | 3 refills | Status: DC

## 2024-02-26 MED ORDER — LOSARTAN POTASSIUM-HCTZ 100-25 MG PO TABS: 1.0000 | ORAL_TABLET | Freq: Every day | ORAL | 3 refills | Status: AC

## 2024-02-26 MED ORDER — TAMSULOSIN HCL 0.4 MG PO CAPS: 0.4000 mg | ORAL_CAPSULE | Freq: Every day | ORAL | 3 refills | Status: AC

## 2024-02-26 MED ORDER — ATORVASTATIN CALCIUM 10 MG PO TABS: 10.0000 mg | ORAL_TABLET | Freq: Every day | ORAL | 3 refills | Status: AC

## 2024-02-26 MED ORDER — METOPROLOL SUCCINATE ER 100 MG PO TB24: 100.0000 mg | ORAL_TABLET | Freq: Every day | ORAL | 3 refills | Status: AC

## 2024-02-26 NOTE — Progress Notes (Signed)
 Flu can be done this fall. Shingles discussed with patient PNA deferred given his previous reaction Tetanus previously done COVID-vaccine previously done RSV vaccine d/w pt.   Colonoscopy 2024 Prostate cancer screening 2025 Advance directive-children designated equally if patient were incapacitated.  Then wife designated if needed.  Vascular hx d/w pt, with h/o dissection.  He had recent f/u.  He isn't doing heaving lifting but is still doing cardio.    Nocturia improved with flomax .     Hypertension:               Using medication without problems or lightheadedness: yes Chest pain with exertion:no Edema:no Short of breath:no Discussed rechecking his creatinine.  See notes on labs.   No recent SZ, on trileptal  per neuro. Compliant.  No events.   Elevated Cholesterol: Using medications without problems:yes Muscle aches: no Diet compliance: yes Exercise: yes Labs d/w pt.  Still exercise at baseline.   His wife has memory changes, d/w pt.    Meds, vitals, and allergies reviewed.   ROS: Per HPI unless specifically indicated in ROS section   GEN: nad, alert and oriented HEENT: ncat NECK: supple w/o LA CV: rrr PULM: ctab, no inc wob ABD: soft, +bs EXT: no edema SKIN: no acute rash

## 2024-02-26 NOTE — Patient Instructions (Signed)
 Go to the lab on the way out.   If you have mychart we'll likely use that to update you.    Take care.  Glad to see you.

## 2024-02-29 ENCOUNTER — Ambulatory Visit: Payer: Self-pay | Admitting: Cardiology

## 2024-02-29 ENCOUNTER — Ambulatory Visit: Payer: Self-pay | Admitting: Family Medicine

## 2024-02-29 NOTE — Assessment & Plan Note (Signed)
 Advance directive-children designated equally if patient were incapacitated.  Then wife designated if needed.

## 2024-02-29 NOTE — Progress Notes (Signed)
 Left heart function is normal. Right heart function appears somewhat reduced. This has been noted on at least one prior echocardiogram. I also reviewed CT scan from 02/18/2024 which is stable. In 08/2023, 6 months f/u was recommended. Please arrange non urgent follow up, where we can further discuss echocardiogram findings.  Thanks MJP

## 2024-02-29 NOTE — Assessment & Plan Note (Signed)
 Rationale for current medicines discussed.  Continue amlodipine  losartan  hydrochlorothiazide  and metoprolol .  Labs discussed with patient.

## 2024-02-29 NOTE — Assessment & Plan Note (Signed)
 Flu can be done this fall. Shingles discussed with patient PNA deferred given his previous reaction Tetanus previously done COVID-vaccine previously done RSV vaccine d/w pt.   Colonoscopy 2024 Prostate cancer screening 2025 Advance directive-children designated equally if patient were incapacitated.  Then wife designated if needed.

## 2024-02-29 NOTE — Assessment & Plan Note (Signed)
Improved with Flomax.  Would continue as is. 

## 2024-02-29 NOTE — Assessment & Plan Note (Signed)
 Vascular hx d/w pt, with h/o dissection.   He had recent follow-up.  He is not doing heavy lifting but still able to do cardio.  Discussed rationale for his current blood pressure medications.

## 2024-02-29 NOTE — Assessment & Plan Note (Signed)
 No recent SZ, on trileptal  per neuro. Compliant.  No events.

## 2024-02-29 NOTE — Assessment & Plan Note (Signed)
 Continue atorvastatin.  Labs discussed.

## 2024-03-02 NOTE — Telephone Encounter (Signed)
 Patient is returning call. Please return call to [phone].

## 2024-03-02 NOTE — Progress Notes (Signed)
 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 German in 2021.  He has subsequently been followed by Dr. Obadiah until his recent retirement.  He was last seen by him on 02/03/2023.  CTA of the chest at that time showed persistent patency of the false lumen beyond the distal anastomosis of the graft with slight enlargement of the aortic diameter to 5.2 cm in the proximal descending aorta.  When I last saw him on 08/27/2023 he had continued progressive enlargement of the distal aortic arch and proximal descending thoracic aorta to about 5.7 cm. The aorta measured about 5.4 cm in this location in July 2024 and going back to his scan at the time of presentation in 2021 the same area measured about 4.2 cm. There was slightly more mural thrombus present within the false lumen of the aortic arch. The residual dissection flap was unchanged and extended down to the mid descending thoracic aorta.  He continues to remain active exercising and riding his bike.   Current Outpatient Medications  Medication Sig Dispense Refill   ferrous sulfate  325 (65 FE) MG tablet TAKE 1 TABLET BY MOUTH EVERY DAY WITH BREAKFAST 90 tablet 0   Multiple Vitamin (MULTIVITAMIN) tablet Take 1 tablet by mouth daily. Alive     oxcarbazepine  (TRILEPTAL ) 600 MG tablet Take 600 mg by mouth 2 (two) times daily.     pantoprazole  (PROTONIX ) 40 MG tablet Take 1 tablet (40 mg total) by mouth daily. 90 tablet 4   amLODipine  (NORVASC ) 10 MG tablet Take 1 tablet (10 mg total) by mouth daily. 90 tablet 3   atorvastatin  (LIPITOR) 10 MG tablet Take 1 tablet (10 mg total) by mouth daily. 90 tablet 3   losartan -hydrochlorothiazide  (HYZAAR) 100-25 MG tablet Take 1 tablet by mouth daily. 90 tablet 3   metoprolol  succinate (TOPROL -XL) 100 MG 24 hr tablet Take 1 tablet (100 mg total) by mouth  daily. TAKE WITH OR IMMEDIATELY FOLLOWING A MEAL. 90 tablet 3   tamsulosin  (FLOMAX ) 0.4 MG CAPS capsule Take 1 capsule (0.4 mg total) by mouth daily. 90 capsule 3   No current facility-administered medications for this visit.     Physical Exam: BP (!) 140/80   Pulse 60   Resp 20   Ht 5' 10 (1.778 m)   Wt 180 lb (81.6 kg)   SpO2 97% Comment: RA  BMI 25.83 kg/m  He looks well. Cardiac exam shows a regular rate and rhythm with normal heart sounds. Lungs are clear. There is no peripheral edema.  Diagnostic Tests:  Narrative & Impression  EXAM: CTA CHEST, ABDOMEN AND PELVIS WITHOUT AND WITH CONTRAST 02/18/2024 02:03:31 PM   TECHNIQUE: CTA of the chest was performed without and with the administration of intravenous contrast. CTA of the abdomen and pelvis was performed without and with the administration of intravenous contrast. Multiplanar reformatted images are provided for review. MIP images are provided for review. Automated exposure control, iterative reconstruction, and/or weight based adjustment of the mA/kV was utilized to reduce the radiation dose to as low as reasonably achievable.   COMPARISON: CT angio chest from 08/14/2023 and CTA abdomen and pelvis 12/31/2021.   CLINICAL HISTORY: Aortic aneurysm suspected. Aortic dissection.   FINDINGS:   VASCULATURE:   AORTA: Stable postsurgical  change from previous median sternotomy, ascending aortic grafting and repair of a type A aortic dissection. The ascending aortic graft is unchanged from the previous examination. Residual chronic aortic dissection which begins just distal to the graft extending into the mid descending thoracic aorta appears unchanged. Dissection flap extends into the innominate and left subclavian arteries as before. There is a proximal fenestration with resultant opacification of the false lumen within the aortic arch. Similar appearance of mural thrombus within the false lumen at the level of  the aortic arch. The diameter of the aortic arch using the same measurement technique as on the prior exam measures 5.1 cm, axial image 37/302. On the coronal images this measures 5.7 cm, image 96/601. These measurements are unchanged from exam. Normal caliber descending thoracic aorta.   PULMONARY ARTERIES: The pulmonary artery is patent.   GREAT VESSELS OF AORTIC ARCH: The great vessels remain patent.   CELIAC TRUNK: No acute finding. No occlusion or significant stenosis.   SUPERIOR MESENTERIC ARTERY: No acute finding. No occlusion or significant stenosis.   INFERIOR MESENTERIC ARTERY: No acute finding. No occlusion or significant stenosis.   RENAL ARTERIES: No acute finding. No occlusion or significant stenosis.   ILIAC ARTERIES: Atherosclerotic calcifications are noted involving bilateral common iliac arteries.   CHEST:   MEDIASTINUM: Heart size normal. No significant pericardial effusion.   LUNGS AND PLEURA: The lungs are without acute process. No focal consolidation or pulmonary edema. No evidence of pleural effusion or pneumothorax.   THORACIC BONES AND SOFT TISSUES: No acute bone or soft tissue abnormality.   ABDOMEN AND PELVIS:   LIVER: 9 mm low attenuation structure identified within the right lobe of liver is technically too small to characterize but appears unchanged from 12/31/2021, favoring a benign cyst, axial image 1/25 of 302.   GALLBLADDER AND BILE DUCTS: Gallbladder is unremarkable. No biliary ductal dilatation.   SPLEEN: The spleen is unremarkable.   PANCREAS: The pancreas is unremarkable.   ADRENAL GLANDS: Bilateral adrenal glands demonstrate no acute abnormality.   KIDNEYS, URETERS AND BLADDER: Inferior pole subtle kidney cyst within the right kidney measures 1.4 cm, axial image 147. No follow up imaging recommended. No stones in the kidneys or ureters. No hydronephrosis. No perinephric or periureteral stranding. Urinary bladder is  unremarkable.   GI AND BOWEL: The appendix is not confidently identified. Stomach and duodenal sweep demonstrate no acute abnormality. There is no bowel obstruction. No abnormal bowel wall thickening or distension.   REPRODUCTIVE: Prostate gland enlargement with mass effect upon the bladder base.   PERITONEUM AND RETROPERITONEUM: No ascites or free air.   LYMPH NODES: No lymphadenopathy.   ABDOMINAL BONES AND SOFT TISSUES: No acute abnormality of the bones. No acute soft tissue abnormality.   IMPRESSION: 1. Stable postsurgical change from previous median sternotomy, ascending aortic grafting, and repair of a type A aortic dissection. 2. Residual chronic aortic dissection extending into the mid descending thoracic aorta, unchanged from prior exam. 3. Aortic arch diameter measures 5.1 cm (axial) and 5.7 cm (coronal), unchanged from prior exam.   Electronically signed by: Waddell Calk MD 02/18/2024 02:33 PM EDT RP Workstation: [id number]     ECHOCARDIOGRAM REPORT       Patient Name:   Juan Wagner Date of Exam: 02/25/2024  Medical Rec #:  [id number]      Height:       70.0 in  Accession #:    [id number]     Weight:  180.6 lb  Date of Birth:  03-Jun-1951       BSA:          1.999 m  Patient Age:    73 years       BP:           137/85 mmHg  Patient Gender: M              HR:           55 bpm.  Exam Location:  [address]   Procedure: 2D Echo, Cardiac Doppler, Color Doppler and Strain Analysis  (Both            Spectral and Color Flow Doppler were utilized during  procedure).   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                  Nonrheumatic Aortic valve insufficiency.; Risk                  Factors:Hypertension and Hyperlipidemia. S/P Aortic  Dissection                 Repair-05/2020.    Sonographer:    Sharlet Hamilton RDCS  Referring Phys: [id number] [facility] J  PATWARDHAN   IMPRESSIONS     1. Left ventricular ejection fraction, by estimation, is 55 to 60%. The  left ventricle has normal function. The left ventricle has no regional  wall motion abnormalities. There is mild concentric left ventricular  hypertrophy. Left ventricular diastolic  function could not be evaluated. The average left ventricular global  longitudinal strain is -15.4 %.   2. Right ventricular systolic function is moderately reduced. The right  ventricular size is moderately enlarged.   3. Left atrial size was mildly dilated.   4. The mitral valve is normal in structure. Mild mitral valve  regurgitation. No evidence of mitral stenosis.   5. The aortic valve is normal in structure. Aortic valve regurgitation is  mild. No aortic stenosis is present.   6. Aortic root/ascending aorta has been repaired/replaced. There is mild  dilatation of the aortic root, measuring 40 mm. There is moderate  dilatation of the ascending aorta, measuring 45 mm.   7. The inferior vena cava is normal in size with greater than 50%  respiratory variability, suggesting right atrial pressure of 3 mmHg.   FINDINGS   Left Ventricle: Left ventricular ejection fraction, by estimation, is 55  to 60%. The left ventricle has normal function. The left ventricle has no  regional wall motion abnormalities. The average left ventricular global  longitudinal strain is -15.4 %.  The left ventricular internal cavity size was normal in size. There is  mild concentric left ventricular hypertrophy. Abnormal (paradoxical)  septal motion consistent with post-operative status. Left ventricular  diastolic function could not be evaluated  due to atrial fibrillation. Left ventricular diastolic function could not  be evaluated.   Right Ventricle: The right ventricular size is moderately enlarged. No  increase in right ventricular wall thickness. Right ventricular systolic  function is moderately reduced.   Left  Atrium: Left atrial size was mildly dilated.   Right Atrium: Right atrial size was normal in size.   Pericardium: There is no evidence of pericardial effusion.   Mitral Valve: The mitral valve is normal in structure. Mild mitral valve  regurgitation. No evidence of mitral valve  stenosis.   Tricuspid Valve: The tricuspid valve is normal in structure. Tricuspid  valve regurgitation is trivial. No evidence of tricuspid stenosis.   Aortic Valve: The aortic valve is normal in structure. Aortic valve  regurgitation is mild. No aortic stenosis is present. Aortic valve mean  gradient measures 2.0 mmHg. Aortic valve peak gradient measures 4.0 mmHg.  Aortic valve area, by VTI measures 3.88  cm.   Pulmonic Valve: The pulmonic valve was normal in structure. Pulmonic valve  regurgitation is trivial. No evidence of pulmonic stenosis.   Aorta: The aortic root is normal in size and structure and the aortic  root/ascending aorta has been repaired/replaced. There is mild dilatation  of the aortic root, measuring 40 mm. There is moderate dilatation of the  ascending aorta, measuring 45 mm.   Venous: The inferior vena cava is normal in size with greater than 50%  respiratory variability, suggesting right atrial pressure of 3 mmHg.   IAS/Shunts: No atrial level shunt detected by color flow Doppler.     LEFT VENTRICLE  PLAX 2D  LVIDd:         4.48 cm   Diastology  LVIDs:         3.10 cm   LV e' medial:    7.83 cm/s  LV PW:         1.01 cm   LV E/e' medial:  9.6  LV IVS:        0.98 cm   LV e' lateral:   9.77 cm/s  LVOT diam:     2.50 cm   LV E/e' lateral: 7.7  LV SV:         96  LV SV Index:   48        2D Longitudinal Strain  LVOT Area:     4.91 cm  2D Strain GLS (A4C):   -15.2 %                           2D Strain GLS (A3C):   -14.9 %                           2D Strain GLS (A2C):   -16.2 %                           2D Strain GLS Avg:     -15.4 %   RIGHT VENTRICLE             IVC  RV  Basal diam:  4.18 cm     IVC diam: 1.68 cm  RV Mid diam:    3.53 cm  RV S prime:     11.00 cm/s  TAPSE (M-mode): 1.7 cm  RVSP:           17.4 mmHg   LEFT ATRIUM             Index        RIGHT ATRIUM           Index  LA diam:        4.00 cm 2.00 cm/m   RA Pressure: 3.00 mmHg  LA Vol (A2C):   61.6 ml 30.82 ml/m  RA Area:     26.80 cm  LA Vol (A4C):   53.1 ml 26.57 ml/m  RA Volume:   85.60 ml  42.83 ml/m  LA Biplane Vol: 62.3 ml  31.17 ml/m   AORTIC VALVE  AV Area (Vmax):    4.13 cm  AV Area (Vmean):   4.24 cm  AV Area (VTI):     3.88 cm  AV Vmax:           100.25 cm/s  AV Vmean:          66.700 cm/s  AV VTI:            0.248 m  AV Peak Grad:      4.0 mmHg  AV Mean Grad:      2.0 mmHg  LVOT Vmax:         84.40 cm/s  LVOT Vmean:        57.600 cm/s  LVOT VTI:          0.196 m  LVOT/AV VTI ratio: 0.79    AORTA  Ao Root diam: 4.00 cm  Ao Asc diam:  4.50 cm   MV E velocity: 75.50 cm/s  TRICUSPID VALVE  MV A velocity: 49.80 cm/s  TR Peak grad:   14.4 mmHg  MV E/A ratio:  1.52        TR Vmax:        190.00 cm/s                             Estimated RAP:  3.00 mmHg                             RVSP:           17.4 mmHg                               SHUNTS                             Systemic VTI:  0.20 m                             Systemic Diam: 2.50 cm   Toribio Fuel MD  Electronically signed by Toribio Fuel MD  Signature Date/Time: 02/25/2024/2:54:43 PM        Final     Impression:  He has a stable chronic aortic dissection status post repair of an acute type A dissection in 2021.  He has had residual aneurysmal change of the aortic arch and proximal descending aorta which appears stable at 5.1 cm in the axial plane and 5.7 cm in the coronal plane.  I reviewed the CTA images with him and answered all of his questions.  I have recommended continued follow-up with yearly CTA of the chest.  I would not recommend considering surgical repair at his age unless we see  continued progressive increase in the size since this would likely be a very complicated procedure.  I stressed the importance of continued good blood pressure control and preventing further enlargement and recurrent aortic dissection.  I advised him against doing any heavy lifting or strenuous activity that may require a Valsalva maneuver and could suddenly raise his blood pressure to high levels.  Plan:  He will return to see me in 1 year with a CTA of the chest for aortic surveillance.   Dorise MARLA Fellers, MD [facility] [phone]

## 2024-03-08 NOTE — Telephone Encounter (Signed)
 Pt returning call to a nurse

## 2024-03-15 ENCOUNTER — Encounter: Payer: Self-pay | Admitting: Cardiology

## 2024-03-15 ENCOUNTER — Ambulatory Visit: Attending: Cardiology | Admitting: Cardiology

## 2024-03-15 VITALS — BP 102/68 | HR 66 | Ht 71.0 in | Wt 179.0 lb

## 2024-03-15 DIAGNOSIS — I517 Cardiomegaly: Secondary | ICD-10-CM | POA: Diagnosis not present

## 2024-03-15 DIAGNOSIS — I351 Nonrheumatic aortic (valve) insufficiency: Secondary | ICD-10-CM | POA: Diagnosis not present

## 2024-03-15 DIAGNOSIS — R600 Localized edema: Secondary | ICD-10-CM | POA: Insufficient documentation

## 2024-03-15 DIAGNOSIS — I10 Essential (primary) hypertension: Secondary | ICD-10-CM | POA: Diagnosis not present

## 2024-03-15 DIAGNOSIS — Z9889 Other specified postprocedural states: Secondary | ICD-10-CM

## 2024-03-15 MED ORDER — AMLODIPINE BESYLATE 5 MG PO TABS: 5.0000 mg | ORAL_TABLET | Freq: Every day | ORAL | 3 refills | Status: AC

## 2024-03-15 NOTE — Progress Notes (Signed)
 Cardiology Office Note:  .   Date:  03/15/2024  ID:  Juan Wagner, DOB [birth year], MRN [id number] PCP: Cleatus Arlyss RAMAN, MD  [facility] Providers Cardiologist:  Newman Lawrence, MD PCP: Cleatus Arlyss RAMAN, MD  Chief Complaint  Patient presents with   Hypertension   Thoracic aorta aneurysm   Aortic Regurgitation      History of Present Illness: .    Juan Wagner is a 73 y.o. 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 Lucas in 02/2024.  His most recent CTA aorta showed proximal descending aorta at 5.1 cm in axial plane and 5.7 cm in coronal plane.  After reviewing the CTA images, Dr. Lucas suggested no surgical repair at his age unless there is continued progressive increase in the size since it would likely be a very complicated procedure.  Stressed the importance of continued blood pressure control and preventing further enlargement and recurrent aortic dissection, advised against heavy lifting or strenuous activity that may require Valsalva maneuver.  Recommended repeat CTA in 1 year.  He is doing well, he is watchful about his physical activity and avoid heavy weightlifting.  He denies any complains of chest pain, shortness of breath.  Reviewed recent echocardiogram results with the patient, details below.   Vitals:   03/15/24 1421  BP: 102/68  Pulse: 66  SpO2: 96%      ROS:  Review of Systems  Cardiovascular:  Negative for chest pain, dyspnea on exertion, leg swelling, palpitations and syncope.     Studies Reviewed: SABRA        EKG 09/09/2023: Sinus rhythm 54 bpm First degree A-V block Left axis deviation Left ventricular hypertrophy with QRS widening ( R in aVL , Cornell product , Romhilt-Estes ) Nonspecific T wave abnormality When compared with ECG of 03-Jun-2020 05:32, PR interval has  increased Vent. rate has decreased BY  65 BPM ST no longer elevated in Inferior leads ST no longer elevated in Anterolateral leads Nonspecific T wave abnormality has replaced inverted T waves in Lateral leads     Labs 01/2023: Chol 147, TG 50, HDL 67, LDL 70 Hb 12.2 Cr 8.82, eGFR 61  Echocardiogram 02/2024:  1. Left ventricular ejection fraction, by estimation, is 55 to 60%. The  left ventricle has normal function. The left ventricle has no regional  wall motion abnormalities. There is mild concentric left ventricular  hypertrophy. Left ventricular diastolic  function could not be evaluated. The average left ventricular global  longitudinal strain is -15.4 %.   2. Right ventricular systolic function is moderately reduced. The right  ventricular size is moderately enlarged.   3. Left atrial size was mildly dilated.   4. The mitral valve is normal in structure. Mild mitral valve  regurgitation. No evidence of mitral stenosis.   5. The aortic valve is normal in structure. Aortic valve regurgitation is  mild. No aortic stenosis is present.   6. Aortic root/ascending aorta has been repaired/replaced. There is mild  dilatation of the aortic root, measuring 40 mm. There is moderate  dilatation of the ascending aorta, measuring 45 mm.   7. The inferior vena cava is normal in size with greater than 50%  respiratory variability, suggesting right atrial pressure of 3 mmHg.   CTA chest aorta 02/2024: 1. Stable postsurgical change from previous median sternotomy, ascending  aortic grafting, and repair of a type A aortic dissection. 2. Residual chronic aortic dissection extending into the mid descending thoracic aorta, unchanged from prior exam. 3. Aortic arch diameter measures 5.1 cm (axial) and 5.7 cm (coronal), unchanged from prior exam.     Physical Exam:   Physical Exam Vitals and nursing note reviewed.  Constitutional:      General: He is not in acute distress. Neck:      Vascular: No JVD.  Cardiovascular:     Rate and Rhythm: Normal rate and regular rhythm.     Heart sounds: Murmur heard.     High-pitched blowing holosystolic murmur is present with a grade of 2/6 at the apex.     High-pitched blowing decrescendo early diastolic murmur is present with a grade of 2/4 at the upper right sternal border radiating to the apex.  Pulmonary:     Effort: Pulmonary effort is normal.     Breath sounds: Normal breath sounds. No wheezing or rales.  Musculoskeletal:     Right lower leg: No edema.     Left lower leg: No edema.      VISIT DIAGNOSES:   ICD-10-CM   1. S/P aortic dissection repair  Z98.890 ECHOCARDIOGRAM COMPLETE    2. Essential hypertension  I10 amLODipine  (NORVASC ) 5 MG tablet    3. Nonrheumatic aortic valve insufficiency  I35.1 ECHOCARDIOGRAM COMPLETE    4. Leg edema  R60.0     5. Right ventricular enlargement  I51.7         ASSESSMENT AND PLAN: .    Juan Wagner is a 73 y.o. 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 Genetic testing yielded no variant associated with familial aortopathy. Continued increase in aorta size in distal aortic arch and ascending aorta, up to 5.7 cm. Conservative management unless progressive increase in aorta size, as per Dr. Lucas. Repeat CT in 1 year. With no documented Afib since postop period in 2021, DVT in 05/2020 likely provoked post operatively, I discontinued eliquis  in 01/2023.   Hypertension: Continue losartan -HCTZ 100-25 mg daily, metoprolol  succinate 100 mg daily. Given you numbers of systolic blood pressure in 80s at home, reduce amlodipine  to 5 mg daily.  Leg edema: I would like to see if reducing amlodipine  would improve his leg swelling.  He does have  moderately reduced RV systolic function, which was stated in the past.  Etiology is unclear.  If leg swelling does not resolve after reducing amlodipine  dose, I would repeat echocardiogram in 3 months, otherwise repeat echocardiogram in 6 months.   Aortic regurgitation: Mild, stable.  H/o DVT: H/o right gastrocnemius DVT, likely related to immobility post surgery. Completed anticoagulation for provoked DVT.      F/u in 6 months  Signed, Newman JINNY Lawrence, MD

## 2024-03-15 NOTE — Patient Instructions (Addendum)
 Medication: DECREASE Amlodipine  to 5 mg daily   Testing/Procedures: Echocardiogram in 08/2024  Your physician has requested that you have an echocardiogram. Echocardiography is a painless test that uses sound waves to create images of your heart. It provides your doctor with information about the size and shape of your heart and how well your heart's chambers and valves are working. This procedure takes approximately one hour. There are no restrictions for this procedure. Please do NOT wear cologne, perfume, aftershave, or lotions (deodorant is allowed). Please arrive 15 minutes prior to your appointment time.  Please note: We ask at that you not bring children with you during ultrasound (echo/ vascular) testing. Due to room size and safety concerns, children are not allowed in the ultrasound rooms during exams. Our front office staff cannot provide observation of children in our lobby area while testing is being conducted. An adult accompanying a patient to their appointment will only be allowed in the ultrasound room at the discretion of the ultrasound technician under special circumstances. We apologize for any inconvenience.   Follow-Up: At [facility], you and your health needs are our priority.  As part of our continuing mission to provide you with exceptional heart care, our providers are all part of one team.  This team includes your primary Cardiologist (physician) and Advanced Practice Providers or APPs (Physician Assistants and Nurse Practitioners) who all work together to provide you with the care you need, when you need it.  Your next appointment:   05/2024   Provider:   Newman JINNY Lawrence, MD

## 2024-04-09 ENCOUNTER — Ambulatory Visit (INDEPENDENT_AMBULATORY_CARE_PROVIDER_SITE_OTHER)

## 2024-04-09 VITALS — BP 124/82 | Ht 71.0 in | Wt 176.0 lb

## 2024-04-09 DIAGNOSIS — Z0000 Encounter for general adult medical examination without abnormal findings: Secondary | ICD-10-CM | POA: Diagnosis not present

## 2024-04-09 NOTE — Patient Instructions (Addendum)
 [name],  Thank you for taking the time for your Medicare Wellness Visit. I appreciate your continued commitment to your health goals. Please review the care plan we discussed, and feel free to reach out if I can assist you further.  Medicare recommends these wellness visits once per year to help you and your care team stay ahead of potential health issues. These visits are designed to focus on prevention, allowing your provider to concentrate on managing your acute and chronic conditions during your regular appointments.  Please note that Annual Wellness Visits do not include a physical exam. Some assessments may be limited, especially if the visit was conducted virtually. If needed, we may recommend a separate in-person follow-up with your provider.  Ongoing Care Seeing your primary care provider every 3 to 6 months helps us  monitor your health and provide consistent, personalized care.   Referrals If a referral was made during today's visit and you haven't received any updates within two weeks, please contact the referred provider directly to check on the status.  Recommended Screenings:  Health Maintenance  Topic Date Due   Zoster (Shingles) Vaccine (1 of 2) Never done   Pneumococcal Vaccine for age over 71 (2 of 2 - PPSV23, PCV20, or PCV21) 02/02/2015   Medicare Annual Wellness Visit  02/25/2024   COVID-19 Vaccine (6 - Pfizer risk 2024-25 season) 03/22/2024   Flu Shot  10/19/2024*   DTaP/Tdap/Td vaccine (3 - Tdap) 04/17/2026   Colon Cancer Screening  12/17/2029   Hepatitis C Screening  Completed   HPV Vaccine  Aged Out   Meningitis B Vaccine  Aged Out  *Topic was postponed. The date shown is not the original due date.       04/09/2024    2:37 PM  Advanced Directives  Does Patient Have a Medical Advance Directive? Yes  Type of Estate agent of [place];Living will  Copy of Healthcare Power of Attorney in Chart? No - copy requested   Advance Care  Planning is important because it: Ensures you receive medical care that aligns with your values, goals, and preferences. Provides guidance to your family and loved ones, reducing the emotional burden of decision-making during critical moments.  Vision: Annual vision screenings are recommended for early detection of glaucoma, cataracts, and diabetic retinopathy. These exams can also reveal signs of chronic conditions such as diabetes and high blood pressure.  Dental: Annual dental screenings help detect early signs of oral cancer, gum disease, and other conditions linked to overall health, including heart disease and diabetes.

## 2024-04-09 NOTE — Progress Notes (Signed)
 Subjective:   Juan Wagner is a 73 y.o. who presents for a Medicare Wellness preventive visit.  As a reminder, Annual Wellness Visits don't include a physical exam, and some assessments may be limited, especially if this visit is performed virtually. We may recommend an in-person follow-up visit with your provider if needed.  Visit Complete: In person  Persons Participating in Visit: Patient.  AWV Questionnaire: No: Patient Medicare AWV questionnaire was not completed prior to this visit.  Cardiac Risk Factors include: advanced age (>61men, >60 women);dyslipidemia;hypertension;male gender     Objective:    Today's Vitals   04/09/24 1425  BP: 124/82  Weight: 176 lb (79.8 kg)  Height: 5' 11 (1.803 m)   Body mass index is 24.55 kg/m.     04/09/2024    2:37 PM 02/15/2021    8:58 AM 09/01/2020    6:06 AM 05/29/2020    8:34 AM 02/15/2020    8:59 AM 02/10/2018    9:28 AM 04/15/2017    1:55 PM  Advanced Directives  Does Patient Have a Medical Advance Directive? Yes Yes Yes No Yes Yes  Yes   Type of Estate agent of [place];Living will Healthcare Power of [place];Living will Healthcare Power of [place];Living will  Healthcare Power of [place];Living will Healthcare Power of [place];Living will Living will;Healthcare Power of Attorney  Copy of Healthcare Power of Attorney in Chart? No - copy requested No - copy requested No - copy requested  No - copy requested No - copy requested    Would patient like information on creating a medical advance directive?    No - Guardian declined        Data saved with a previous flowsheet row definition    Current Medications (verified) Outpatient Encounter Medications as of 04/09/2024  Medication Sig   amLODipine  (NORVASC ) 5 MG tablet Take 1 tablet (5 mg total) by mouth daily.   atorvastatin  (LIPITOR) 10 MG tablet Take 1 tablet (10 mg total) by mouth daily.   ferrous sulfate  325 (65 FE) MG tablet TAKE 1 TABLET BY MOUTH  EVERY DAY WITH BREAKFAST   losartan -hydrochlorothiazide  (HYZAAR) 100-25 MG tablet Take 1 tablet by mouth daily.   metoprolol  succinate (TOPROL -XL) 100 MG 24 hr tablet Take 1 tablet (100 mg total) by mouth daily. TAKE WITH OR IMMEDIATELY FOLLOWING A MEAL.   Multiple Vitamin (MULTIVITAMIN) tablet Take 1 tablet by mouth daily. Alive   oxcarbazepine  (TRILEPTAL ) 600 MG tablet Take 600 mg by mouth 2 (two) times daily.   pantoprazole  (PROTONIX ) 40 MG tablet Take 1 tablet (40 mg total) by mouth daily.   tamsulosin  (FLOMAX ) 0.4 MG CAPS capsule Take 1 capsule (0.4 mg total) by mouth daily.   No facility-administered encounter medications on file as of 04/09/2024.    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   DVT (deep venous thrombosis) (HCC) 06/12/2020   RLE DVT   ED (erectile dysfunction)    Hyperlipidemia    2018 was 184- past hx    Hypertension    Seizures (HCC)    1st event 03/2011- no seizure in years as of 2019   Sleep apnea    no CPAP use as of 2019   Past Surgical History:  Procedure Laterality Date   APPLICATION OF WOUND VAC N/A 09/01/2020   Procedure: possible APPLICATION OF WOUND VAC;  Surgeon: German Mallard  Z, MD;  Location: [facility] OR;  Service: Vascular;  Laterality: N/A;   COLONOSCOPY  01/20/2023   KNEE SURGERY     scope L knee   POLYPECTOMY     REPAIR OF ACUTE ASCENDING THORACIC AORTIC DISSECTION N/A 05/29/2020   Procedure: REPAIR OF ACUTE ASCENDING THORACIC AORTIC DISSECTION USING HEMASHIELD PLATINUM WOVEN DOUBLE VELOUR VASCULAR GRAFT 32 MM X 30 CM;  Surgeon: German Bartlett PEDLAR, MD;  Location: [facility] OR;  Service: Vascular;  Laterality: N/A;   SEPTOPLASTY     broken nose   TEE WITHOUT CARDIOVERSION N/A 05/29/2020   Procedure: TRANSESOPHAGEAL ECHOCARDIOGRAM (TEE);  Surgeon: German Bartlett PEDLAR, MD;  Location:  [facility] OR;  Service: Open Heart Surgery;  Laterality: N/A;   WOUND DEBRIDEMENT N/A 09/01/2020   Procedure: WOUND DEBRIDEMENT OF LOWER PART OF STERNUM;  Surgeon: German Bartlett PEDLAR, MD;  Location: [facility] OR;  Service: Vascular;  Laterality: N/A;   Family History  Problem Relation Age of Onset   Hypertension Mother    Cancer Mother        ovarian CA chemo 2010 remission   Breast cancer Mother    Ovarian cancer Mother    Alcohol abuse Father    Cancer Father    Throat cancer Father    Esophageal cancer Father    Cancer Sister        pancreatic CA Stage IV,mets to lungs   Pancreatic cancer Sister    Cirrhosis Brother        smoke and drugs   Cancer Brother        lung cancer   Lung cancer Brother    Hypertension Maternal Grandmother    Hypertension Maternal Grandfather    Prostate cancer Neg Hx    Colon cancer Neg Hx    Colon polyps Neg Hx    Rectal cancer Neg Hx    Stomach cancer Neg Hx    Social History   Socioeconomic History   Marital status: Married    Spouse name: Not on file   Number of children: 2   Years of education: Not on file   Highest education level: 12th grade  Occupational History   Not on file  Tobacco Use   Smoking status: Never   Smokeless tobacco: Never  Vaping Use   Vaping status: Never Used  Substance and Sexual Activity   Alcohol use: No    Alcohol/week: 0.0 standard drinks of alcohol   Drug use: No   Sexual activity: Yes  Other Topics Concern   Not on file  Social History Narrative   Married 50+ years   2 kids   Retired from [employer]/r[employer] in [place]   Social Drivers of [employer] Strain: Low Risk  (04/09/2024)   Overall Financial Resource Strain (CARDIA)    Difficulty of Paying Living Expenses: Not hard at all  Food Insecurity: No Food Insecurity (04/09/2024)   Hunger Vital Sign    Worried About Running Out of Food in the Last Year: Never true    Ran Out of Food in the Last Year: Never true  Transportation Needs: No  Transportation Needs (04/09/2024)   PRAPARE - Administrator, Civil Service (Medical): No    Lack of Transportation (Non-Medical): No  Physical Activity: Sufficiently Active (04/09/2024)   Exercise Vital Sign    Days of Exercise per Week: 3 days    Minutes of Exercise per Session: 120 min  Stress: No Stress Concern Present (04/09/2024)   [place]  Institute of Occupational Health - Occupational Stress Questionnaire    Feeling of Stress: Not at all  Social Connections: Socially Integrated (04/09/2024)   Social Connection and Isolation Panel    Frequency of Communication with Friends and Family: More than three times a week    Frequency of Social Gatherings with Friends and Family: Twice a week    Attends Religious Services: More than 4 times per year    Active Member of [employer] or Organizations: Yes    Attends Engineer, structural: More than 4 times per year    Marital Status: Married    Tobacco Counseling Counseling given: Not Answered    Clinical Intake:  Pre-visit preparation completed: Yes  Pain : No/denies pain     BMI - recorded: 24.55 Nutritional Status: BMI of 19-24  Normal Nutritional Risks: None Diabetes: No  No results found for: HGBA1C   How often do you need to have someone help you when you read instructions, pamphlets, or other written materials from your doctor or pharmacy?: 1 - Never  Interpreter Needed?: No  Comments: lives w/wife Information entered by :: B.Keionna Kinnaird,LPN   Activities of Daily Living     04/09/2024    2:37 PM  In your present state of health, do you have any difficulty performing the following activities:  Hearing? 0  Vision? 0  Difficulty concentrating or making decisions? 0  Walking or climbing stairs? 0  Dressing or bathing? 0  Doing errands, shopping? 0  Preparing Food and eating ? N  Using the Toilet? N  In the past six months, have you accidently leaked urine? N  Do you have problems with loss of bowel  control? N  Managing your Medications? N  Managing your Finances? N  Housekeeping or managing your Housekeeping? N    Patient Care Team: Cleatus Arlyss RAMAN, MD as PCP - General (Family Medicine) [place]ira Newman PARAS, MD as PCP - Cardiology (Cardiology) Lane Arthea BRAVO, MD as Referring Physician (Neurology) [facility], [facility], [place]  I have updated your Care Teams any recent Medical Services you may have received from other providers in the past year.     Assessment:   This is a routine wellness examination for [place].  Hearing/Vision screen Hearing Screening - Comments:: Patient denies any hearing difficulties.   Vision Screening - Comments:: Pt says their vision is good with glasses [facility]   Goals Addressed             This Visit's Progress    COMPLETED: Increase physical activity       Starting 02/03/2017, I will continue to do a variety of different types of exercise daily.     Patient Stated   On track    04/09/24-, I will continue to ride my bike 3 days a week for 20 miles.      COMPLETED: Patient Stated       02/15/2021, I will continue to ride my bike 3 days a week for 20 miles.     Patient Stated       No new goals       Depression Screen     04/09/2024    2:31 PM 02/26/2024   11:04 AM 02/25/2023   10:10 AM 02/15/2021    8:59 AM 06/13/2020    2:12 PM 02/15/2020    9:00 AM 02/12/2019   12:12 PM  PHQ 2/9 Scores  PHQ - 2 Score 0 0 0 0 0 0  0  PHQ- 9 Score   0 0  0     Fall Risk     04/09/2024    2:27 PM 02/26/2024   11:04 AM 02/25/2023   10:10 AM 02/21/2022   10:08 AM 02/15/2021    8:59 AM  Fall Risk   Falls in the past year? 0 0 0 0 0  Number falls in past yr: 0  0 0 0  Injury with Fall? 0  0 0 0  Risk for fall due to : No Fall Risks  No Fall Risks No Fall Risks No Fall Risks  Follow up Education provided;Falls prevention discussed  Falls evaluation completed Falls evaluation completed  Falls evaluation completed;Falls  prevention discussed      Data saved with a previous flowsheet row definition    MEDICARE RISK AT HOME:  Medicare Risk at Home Any stairs in or around the home?: Yes If so, are there any without handrails?: Yes Home free of loose throw rugs in walkways, pet beds, electrical cords, etc?: Yes Adequate lighting in your home to reduce risk of falls?: Yes Life alert?: No Use of a cane, [place] or w/c?: No Grab bars in the bathroom?: No Shower chair or bench in shower?: No Elevated toilet seat or a handicapped toilet?: Yes  TIMED UP AND GO:  Was the test performed?  Yes  Length of time to ambulate 10 feet: 8 sec Gait steady and fast without use of assistive device  Cognitive Function: 6CIT completed    02/15/2021    9:07 AM 02/15/2020    9:01 AM 02/10/2018    9:27 AM 02/03/2017    9:54 AM  MMSE - Mini Mental State Exam  Orientation to time 5 5 5 5    Orientation to Place 5 5 5 5    Registration 3 3 3 3    Attention/ Calculation 5 5 0 0   Recall 3 2 3 3    Language- name 2 objects   0 0   Language- repeat 1 1 1 1   Language- follow 3 step command   3 3   Language- read & follow direction   0 0   Write a sentence   0 0   Copy design   0 0   Total score   20 20      Data saved with a previous flowsheet row definition        04/09/2024    2:38 PM  6CIT Screen  What Year? 0 points  What month? 0 points  What time? 0 points  Count back from 20 0 points  Months in reverse 0 points  Repeat phrase 0 points  Total Score 0 points    Immunizations Immunization History  Administered Date(s) Administered   Fluad Quad(high Dose 65+) 05/17/2022   Influenza,inj,Quad PF,6+ Mos 05/25/2021   PFIZER(Purple Top)SARS-COV-2 Vaccination 08/27/2019, 09/17/2019, 05/13/2020   Pfizer(Comirnaty)Fall Seasonal Vaccine 12 years and older 05/01/2022, 06/26/2023   Pneumococcal Conjugate-13 12/08/2014   Td 12/21/2008, 04/17/2016    Screening Tests Health Maintenance  Topic Date Due   Zoster  Vaccines- Shingrix (1 of 2) Never done   Pneumococcal Vaccine: 50+ Years (2 of 2 - PPSV23, PCV20, or PCV21) 02/02/2015   Medicare Annual Wellness (AWV)  02/25/2024   COVID-19 Vaccine (6 - Pfizer risk 2024-25 season) 03/22/2024   Influenza Vaccine  10/19/2024 (Originally 02/20/2024)   DTaP/Tdap/Td (3 - Tdap) 04/17/2026   Colonoscopy  12/17/2029   Hepatitis C Screening  Completed   HPV  VACCINES  Aged Out   Meningococcal B Vaccine  Aged Out    Health Maintenance Items Addressed: [facility]  Additional Screening:  Vision Screening: Recommended annual ophthalmology exams for early detection of glaucoma and other disorders of the eye. Is the patient up to date with their annual eye exam?  Yes  Who is the provider or what is the name of the office in which the patient attends annual eye exams? [facility]   Dental Screening: Recommended annual dental exams for proper oral hygiene  Community Resource Referral / Chronic Care Management: CRR required this visit?  No   CCM required this visit?  No   Plan:    I have personally reviewed and noted the following in the patient's chart:   Medical and social history Use of alcohol, tobacco or illicit drugs  Current medications and supplements including opioid prescriptions. Patient is not currently taking opioid prescriptions. Functional ability and status Nutritional status Physical activity Advanced directives List of other physicians Hospitalizations, surgeries, and ER visits in previous 12 months Vitals Screenings to include cognitive, depression, and falls Referrals and appointments  In addition, I have reviewed and discussed with patient certain preventive protocols, quality metrics, and best practice recommendations. A written personalized care plan for preventive services as well as general preventive health recommendations were provided to patient.   Erminio LITTIE Saris,  LPN   [id number]   After Visit Summary: (MyChart) Due to this being a telephonic visit, the after visit summary with patients personalized plan was offered to patient via MyChart   Notes: Nothing significant to report at this time.

## 2024-05-04 ENCOUNTER — Ambulatory Visit: Admitting: Cardiology

## 2024-05-08 ENCOUNTER — Other Ambulatory Visit: Payer: Self-pay | Admitting: Family Medicine

## 2024-05-08 DIAGNOSIS — D649 Anemia, unspecified: Secondary | ICD-10-CM

## 2024-05-31 ENCOUNTER — Other Ambulatory Visit: Payer: Self-pay | Admitting: Family Medicine

## 2024-05-31 DIAGNOSIS — I10 Essential (primary) hypertension: Secondary | ICD-10-CM

## 2024-07-16 ENCOUNTER — Ambulatory Visit: Attending: Cardiology | Admitting: Cardiology

## 2024-07-16 ENCOUNTER — Encounter: Payer: Self-pay | Admitting: Cardiology

## 2024-07-16 VITALS — BP 124/70 | HR 59 | Ht 70.0 in | Wt 182.0 lb

## 2024-07-16 DIAGNOSIS — I7122 Aneurysm of the aortic arch, without rupture: Secondary | ICD-10-CM

## 2024-07-16 DIAGNOSIS — I10 Essential (primary) hypertension: Secondary | ICD-10-CM | POA: Diagnosis not present

## 2024-07-16 NOTE — Progress Notes (Signed)
 " Cardiology Office Note:  .   Date:  07/16/2024  ID:  Josefa JONELLE Finder, DOB Aug 21, 1950, MRN [id number] PCP: Cleatus Arlyss RAMAN, MD  [facility] Providers Cardiologist:  Newman Lawrence, MD PCP: Cleatus Arlyss RAMAN, MD  Chief Complaint  Patient presents with   TAA      History of Present Illness: .    ROBIE MCNIEL is a 73 y.o. 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 Lucas in 02/2024.  His most recent CTA aorta showed proximal descending aorta at 5.1 cm in axial plane and 5.7 cm in coronal plane.  After reviewing the CTA images, Dr. Lucas suggested no surgical repair at his age unless there is continued progressive increase in the size since it would likely be a very complicated procedure.  Stressed the importance of continued blood pressure control and preventing further enlargement and recurrent aortic dissection, advised against heavy lifting or strenuous activity that may require Valsalva maneuver.  Recommended repeat CTA in 1 year.  Patient is doing well.  At last visit, we reduced amlodipine  dose from 10 mg daily to 5 mg daily, with which his leg swelling is completely resolved.  He has no other symptoms or complaints today.  Vitals:   07/16/24 1424  BP: 124/70  Pulse: (!) 59  SpO2: 99%       ROS:  Review of Systems  Cardiovascular:  Negative for chest pain, dyspnea on exertion, leg swelling, palpitations and syncope.     Studies Reviewed: SABRA        EKG 07/16/2024: Sinus bradycardia with 1st degree A-V block Left axis deviation Left ventricular hypertrophy with QRS widening ( R in aVL , Cornell product , Romhilt-Estes ) When compared with ECG of 09-Sep-2023 09:44, No significant change was found   Echocardiogram 02/2024:  1. Left ventricular ejection fraction, by estimation, is 55 to 60%. The   left ventricle has normal function. The left ventricle has no regional  wall motion abnormalities. There is mild concentric left ventricular  hypertrophy. Left ventricular diastolic function could not be evaluated.  The average left ventricular global longitudinal strain is -15.4 %.   2. Right ventricular systolic function is moderately reduced. The right  ventricular size is moderately enlarged.   3. Left atrial size was mildly dilated.   4. The mitral valve is normal in structure. Mild mitral valve  regurgitation. No evidence of mitral stenosis.   5. The aortic valve is normal in structure. Aortic valve regurgitation is  mild. No aortic stenosis is present.   6. Aortic root/ascending aorta has been repaired/replaced. There is mild  dilatation of the aortic root, measuring 40 mm. There is moderate  dilatation of the ascending aorta, measuring 45 mm.   7. The inferior vena cava is normal in size with greater than 50%  respiratory variability, suggesting right atrial pressure of 3 mmHg.   CTA chest aorta 02/2024: 1. Stable postsurgical change from previous median sternotomy, ascending aortic grafting, and repair of a type A aortic dissection. 2. Residual chronic aortic dissection extending into the mid descending thoracic aorta, unchanged from prior exam. 3. Aortic arch diameter measures 5.1 cm (axial) and 5.7 cm (coronal), unchanged from prior exam.     Physical Exam:   Physical Exam Vitals and nursing note reviewed.  Constitutional:  General: He is not in acute distress. Neck:     Vascular: No JVD.  Cardiovascular:     Rate and Rhythm: Normal rate and regular rhythm.     Heart sounds: Murmur heard.     High-pitched blowing holosystolic murmur is present with a grade of 2/6 at the apex.     High-pitched blowing decrescendo early diastolic murmur is present with a grade of 2/4 at the upper right sternal border radiating to the apex.  Pulmonary:     Effort: Pulmonary effort  is normal.     Breath sounds: Normal breath sounds. No wheezing or rales.  Musculoskeletal:     Right lower leg: No edema.     Left lower leg: No edema.    Labs 02/2024: Chol 153, TG 50, HDL 74, LDL 69 Hb 12.4 Cr 8.80, eGFR 60 TSH 1.9   VISIT DIAGNOSES:   ICD-10-CM   1. Essential hypertension  I10 EKG 12-Lead    2. 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    POSEIDON PAM is a 73 y.o. 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 Genetic testing yielded no variant associated with familial aortopathy. Continued increase in aorta size in distal aortic arch and ascending aorta, up to 5.7 cm. Conservative management unless progressive increase in aorta size, as per Dr. Lucas. Repeat CT in 1 year since last one which was in 01/2024.. With no documented Afib since postop period in 2021, DVT in 05/2020 likely provoked post operatively, I discontinued eliquis  in 01/2023.   Hypertension: Well-controlled, even after reducing amlodipine  to 5 mg daily.  With this, his leg edema is completely resolved.  He did have some RV systolic dysfunction on echocardiogram in 02/2024.  Will repeat in 08/2024, but has no clinical symptoms of heart failure at this time.  H/o DVT: H/o right gastrocnemius DVT, likely related to immobility post surgery. Completed anticoagulation for provoked DVT.      F/u in 6 months  Signed, Newman JINNY Lawrence, MD  "

## 2024-07-16 NOTE — Patient Instructions (Addendum)
 Medication Instructions:  Your physician recommends that you continue on your current medications as directed. Please refer to the Current Medication list given to you today.   *If you need a refill on your cardiac medications before your next appointment, please call your pharmacy*  Lab Work: None.  If you have labs (blood work) drawn today and your tests are completely normal, you will receive your results only by: MyChart Message (if you have MyChart) OR A paper copy in the mail If you have any lab test that is abnormal or we need to change your treatment, we will call you to review the results.  Testing/Procedures: Please keep your appointment for your echocardiogram on 09/02/2024.  Follow-Up: At [facility], you and your health needs are our priority.  As part of our continuing mission to provide you with exceptional heart care, our providers are all part of one team.  This team includes your primary Cardiologist (physician) and Advanced Practice Providers or APPs (Physician Assistants and Nurse Practitioners) who all work together to provide you with the care you need, when you need it.  Your next appointment:   6 month(s)  Provider:   Newman JINNY Lawrence, MD

## 2024-09-02 ENCOUNTER — Ambulatory Visit (HOSPITAL_COMMUNITY)

## 2025-02-28 ENCOUNTER — Encounter: Admitting: Family Medicine

## 2025-04-13 ENCOUNTER — Ambulatory Visit
